# Patient Record
Sex: Female | Born: 1960 | Race: Black or African American | Hispanic: No | Marital: Married | Smoking: Former smoker
Health system: Southern US, Community
[De-identification: ages and names within clinical notes are randomized; demographics above are authoritative.]

## PROBLEM LIST (undated history)

## (undated) DIAGNOSIS — E785 Hyperlipidemia, unspecified: Secondary | ICD-10-CM

## (undated) DIAGNOSIS — F329 Major depressive disorder, single episode, unspecified: Secondary | ICD-10-CM

## (undated) DIAGNOSIS — L748 Other eccrine sweat disorders: Secondary | ICD-10-CM

## (undated) DIAGNOSIS — E119 Type 2 diabetes mellitus without complications: Secondary | ICD-10-CM

## (undated) DIAGNOSIS — M1990 Unspecified osteoarthritis, unspecified site: Secondary | ICD-10-CM

## (undated) DIAGNOSIS — R519 Headache, unspecified: Secondary | ICD-10-CM

## (undated) DIAGNOSIS — K219 Gastro-esophageal reflux disease without esophagitis: Secondary | ICD-10-CM

## (undated) DIAGNOSIS — IMO0001 Reserved for inherently not codable concepts without codable children: Secondary | ICD-10-CM

## (undated) DIAGNOSIS — T7840XA Allergy, unspecified, initial encounter: Secondary | ICD-10-CM

## (undated) DIAGNOSIS — E039 Hypothyroidism, unspecified: Secondary | ICD-10-CM

## (undated) DIAGNOSIS — R42 Dizziness and giddiness: Secondary | ICD-10-CM

## (undated) DIAGNOSIS — H409 Unspecified glaucoma: Secondary | ICD-10-CM

## (undated) DIAGNOSIS — G4733 Obstructive sleep apnea (adult) (pediatric): Secondary | ICD-10-CM

## (undated) DIAGNOSIS — D649 Anemia, unspecified: Secondary | ICD-10-CM

## (undated) DIAGNOSIS — R011 Cardiac murmur, unspecified: Secondary | ICD-10-CM

## (undated) DIAGNOSIS — I10 Essential (primary) hypertension: Secondary | ICD-10-CM

## (undated) DIAGNOSIS — IMO0002 Reserved for concepts with insufficient information to code with codable children: Secondary | ICD-10-CM

## (undated) DIAGNOSIS — F32A Depression, unspecified: Secondary | ICD-10-CM

## (undated) DIAGNOSIS — L750 Bromhidrosis: Secondary | ICD-10-CM

## (undated) DIAGNOSIS — M542 Cervicalgia: Secondary | ICD-10-CM

## (undated) DIAGNOSIS — G5600 Carpal tunnel syndrome, unspecified upper limb: Secondary | ICD-10-CM

## (undated) DIAGNOSIS — D219 Benign neoplasm of connective and other soft tissue, unspecified: Secondary | ICD-10-CM

## (undated) DIAGNOSIS — F419 Anxiety disorder, unspecified: Secondary | ICD-10-CM

## (undated) HISTORY — DX: Benign neoplasm of connective and other soft tissue, unspecified: D21.9

## (undated) HISTORY — PX: COLOSTOMY: SHX63

## (undated) HISTORY — DX: Cervicalgia: M54.2

## (undated) HISTORY — PX: TONSILLECTOMY: SUR1361

## (undated) HISTORY — DX: Type 2 diabetes mellitus without complications: E11.9

## (undated) HISTORY — DX: Hyperlipidemia, unspecified: E78.5

## (undated) HISTORY — DX: Major depressive disorder, single episode, unspecified: F32.9

## (undated) HISTORY — DX: Allergy, unspecified, initial encounter: T78.40XA

## (undated) HISTORY — DX: Unspecified glaucoma: H40.9

## (undated) HISTORY — DX: Gastro-esophageal reflux disease without esophagitis: K21.9

## (undated) HISTORY — DX: Anemia, unspecified: D64.9

## (undated) HISTORY — DX: Reserved for inherently not codable concepts without codable children: IMO0001

## (undated) HISTORY — DX: Depression, unspecified: F32.A

## (undated) HISTORY — DX: Carpal tunnel syndrome, unspecified upper limb: G56.00

## (undated) HISTORY — DX: Reserved for concepts with insufficient information to code with codable children: IMO0002

## (undated) HISTORY — DX: Dizziness and giddiness: R42

## (undated) HISTORY — DX: Other eccrine sweat disorders: L74.8

## (undated) HISTORY — PX: COLOSTOMY TAKEDOWN: SHX5258

## (undated) HISTORY — DX: Bromhidrosis: L75.0

## (undated) HISTORY — DX: Obstructive sleep apnea (adult) (pediatric): G47.33

## (undated) HISTORY — DX: Hypothyroidism, unspecified: E03.9

## (undated) HISTORY — DX: Essential (primary) hypertension: I10

---

## 1997-09-21 ENCOUNTER — Emergency Department (HOSPITAL_COMMUNITY): Admission: EM | Admit: 1997-09-21 | Discharge: 1997-09-21 | Payer: Self-pay | Admitting: Family Medicine

## 1998-11-07 ENCOUNTER — Encounter: Admission: RE | Admit: 1998-11-07 | Discharge: 1998-11-20 | Payer: Self-pay | Admitting: *Deleted

## 1999-09-16 ENCOUNTER — Other Ambulatory Visit: Admission: RE | Admit: 1999-09-16 | Discharge: 1999-09-16 | Payer: Self-pay | Admitting: Obstetrics & Gynecology

## 2000-10-31 ENCOUNTER — Encounter: Payer: Self-pay | Admitting: Family Medicine

## 2000-10-31 ENCOUNTER — Encounter: Admission: RE | Admit: 2000-10-31 | Discharge: 2000-10-31 | Payer: Self-pay | Admitting: Family Medicine

## 2000-11-09 ENCOUNTER — Other Ambulatory Visit: Admission: RE | Admit: 2000-11-09 | Discharge: 2000-11-09 | Payer: Self-pay | Admitting: Obstetrics and Gynecology

## 2001-11-08 ENCOUNTER — Other Ambulatory Visit: Admission: RE | Admit: 2001-11-08 | Discharge: 2001-11-08 | Payer: Self-pay | Admitting: Obstetrics and Gynecology

## 2001-12-27 ENCOUNTER — Encounter: Admission: RE | Admit: 2001-12-27 | Discharge: 2001-12-27 | Payer: Self-pay | Admitting: Obstetrics and Gynecology

## 2001-12-27 ENCOUNTER — Encounter: Payer: Self-pay | Admitting: Obstetrics and Gynecology

## 2002-06-15 ENCOUNTER — Emergency Department (HOSPITAL_COMMUNITY): Admission: EM | Admit: 2002-06-15 | Discharge: 2002-06-15 | Payer: Self-pay | Admitting: Emergency Medicine

## 2002-09-14 ENCOUNTER — Encounter: Payer: Self-pay | Admitting: Emergency Medicine

## 2002-09-14 ENCOUNTER — Emergency Department (HOSPITAL_COMMUNITY): Admission: EM | Admit: 2002-09-14 | Discharge: 2002-09-14 | Payer: Self-pay | Admitting: Emergency Medicine

## 2003-02-12 ENCOUNTER — Encounter: Admission: RE | Admit: 2003-02-12 | Discharge: 2003-02-12 | Payer: Self-pay | Admitting: Family Medicine

## 2003-05-17 ENCOUNTER — Other Ambulatory Visit: Admission: RE | Admit: 2003-05-17 | Discharge: 2003-05-17 | Payer: Self-pay | Admitting: Gynecology

## 2004-03-17 ENCOUNTER — Encounter: Admission: RE | Admit: 2004-03-17 | Discharge: 2004-03-17 | Payer: Self-pay | Admitting: Family Medicine

## 2004-05-24 ENCOUNTER — Emergency Department (HOSPITAL_COMMUNITY): Admission: EM | Admit: 2004-05-24 | Discharge: 2004-05-24 | Payer: Self-pay | Admitting: Emergency Medicine

## 2004-05-24 ENCOUNTER — Emergency Department (HOSPITAL_COMMUNITY): Admission: EM | Admit: 2004-05-24 | Discharge: 2004-05-24 | Payer: Self-pay | Admitting: Family Medicine

## 2004-08-12 ENCOUNTER — Other Ambulatory Visit: Admission: RE | Admit: 2004-08-12 | Discharge: 2004-08-12 | Payer: Self-pay | Admitting: Obstetrics and Gynecology

## 2005-04-28 ENCOUNTER — Encounter: Admission: RE | Admit: 2005-04-28 | Discharge: 2005-04-28 | Payer: Self-pay | Admitting: Family Medicine

## 2005-05-07 ENCOUNTER — Emergency Department (HOSPITAL_COMMUNITY): Admission: EM | Admit: 2005-05-07 | Discharge: 2005-05-07 | Payer: Self-pay | Admitting: Emergency Medicine

## 2005-09-16 ENCOUNTER — Other Ambulatory Visit: Admission: RE | Admit: 2005-09-16 | Discharge: 2005-09-16 | Payer: Self-pay | Admitting: Obstetrics and Gynecology

## 2006-09-16 ENCOUNTER — Emergency Department (HOSPITAL_COMMUNITY): Admission: EM | Admit: 2006-09-16 | Discharge: 2006-09-17 | Payer: Self-pay | Admitting: Emergency Medicine

## 2007-01-02 ENCOUNTER — Other Ambulatory Visit: Admission: RE | Admit: 2007-01-02 | Discharge: 2007-01-02 | Payer: Self-pay | Admitting: Gynecology

## 2007-07-14 ENCOUNTER — Emergency Department (HOSPITAL_COMMUNITY): Admission: EM | Admit: 2007-07-14 | Discharge: 2007-07-14 | Payer: Self-pay | Admitting: Family Medicine

## 2008-02-28 ENCOUNTER — Encounter: Payer: Self-pay | Admitting: Women's Health

## 2008-02-28 ENCOUNTER — Ambulatory Visit: Payer: Self-pay | Admitting: Women's Health

## 2008-02-28 ENCOUNTER — Other Ambulatory Visit: Admission: RE | Admit: 2008-02-28 | Discharge: 2008-02-28 | Payer: Self-pay | Admitting: Gynecology

## 2008-03-01 ENCOUNTER — Encounter: Admission: RE | Admit: 2008-03-01 | Discharge: 2008-03-01 | Payer: Self-pay | Admitting: Gynecology

## 2008-10-23 ENCOUNTER — Ambulatory Visit: Payer: Self-pay | Admitting: Women's Health

## 2009-01-04 HISTORY — PX: ABDOMINAL SURGERY: SHX537

## 2010-01-04 HISTORY — PX: COLON SURGERY: SHX602

## 2010-01-04 HISTORY — PX: VENTRAL HERNIA REPAIR: SHX424

## 2010-01-05 ENCOUNTER — Emergency Department (HOSPITAL_COMMUNITY)
Admission: EM | Admit: 2010-01-05 | Discharge: 2010-01-05 | Payer: Self-pay | Source: Home / Self Care | Admitting: Family Medicine

## 2010-01-25 ENCOUNTER — Encounter: Payer: Self-pay | Admitting: Family Medicine

## 2010-02-06 ENCOUNTER — Other Ambulatory Visit: Payer: Self-pay | Admitting: General Surgery

## 2010-02-06 ENCOUNTER — Inpatient Hospital Stay (HOSPITAL_COMMUNITY)
Admission: EM | Admit: 2010-02-06 | Discharge: 2010-02-19 | DRG: 329 | Disposition: A | Discharge status: Home Health | Payer: 59 | Attending: Surgery | Admitting: Surgery

## 2010-02-06 ENCOUNTER — Inpatient Hospital Stay (HOSPITAL_COMMUNITY): Payer: 59

## 2010-02-06 DIAGNOSIS — J45909 Unspecified asthma, uncomplicated: Secondary | ICD-10-CM | POA: Diagnosis present

## 2010-02-06 DIAGNOSIS — Z79899 Other long term (current) drug therapy: Secondary | ICD-10-CM

## 2010-02-06 DIAGNOSIS — F329 Major depressive disorder, single episode, unspecified: Secondary | ICD-10-CM | POA: Diagnosis present

## 2010-02-06 DIAGNOSIS — E119 Type 2 diabetes mellitus without complications: Secondary | ICD-10-CM | POA: Diagnosis present

## 2010-02-06 DIAGNOSIS — J9600 Acute respiratory failure, unspecified whether with hypoxia or hypercapnia: Secondary | ICD-10-CM

## 2010-02-06 DIAGNOSIS — A419 Sepsis, unspecified organism: Secondary | ICD-10-CM | POA: Diagnosis not present

## 2010-02-06 DIAGNOSIS — D62 Acute posthemorrhagic anemia: Secondary | ICD-10-CM | POA: Diagnosis not present

## 2010-02-06 DIAGNOSIS — R5381 Other malaise: Secondary | ICD-10-CM | POA: Diagnosis not present

## 2010-02-06 DIAGNOSIS — K5732 Diverticulitis of large intestine without perforation or abscess without bleeding: Principal | ICD-10-CM | POA: Diagnosis present

## 2010-02-06 DIAGNOSIS — K659 Peritonitis, unspecified: Secondary | ICD-10-CM | POA: Diagnosis present

## 2010-02-06 DIAGNOSIS — D72829 Elevated white blood cell count, unspecified: Secondary | ICD-10-CM | POA: Diagnosis not present

## 2010-02-06 DIAGNOSIS — E876 Hypokalemia: Secondary | ICD-10-CM | POA: Diagnosis not present

## 2010-02-06 DIAGNOSIS — F3289 Other specified depressive episodes: Secondary | ICD-10-CM | POA: Diagnosis present

## 2010-02-06 DIAGNOSIS — K5730 Diverticulosis of large intestine without perforation or abscess without bleeding: Secondary | ICD-10-CM

## 2010-02-06 DIAGNOSIS — I469 Cardiac arrest, cause unspecified: Secondary | ICD-10-CM | POA: Diagnosis not present

## 2010-02-06 LAB — DIFFERENTIAL
Basophils Absolute: 0 10*3/uL (ref 0.0–0.1)
Basophils Relative: 0 % (ref 0–1)
Eosinophils Absolute: 0 10*3/uL (ref 0.0–0.7)
Lymphocytes Relative: 30 % (ref 12–46)
Monocytes Absolute: 0.5 10*3/uL (ref 0.1–1.0)
Monocytes Relative: 5 % (ref 3–12)
Neutro Abs: 6.1 10*3/uL (ref 1.7–7.7)
Neutrophils Relative %: 65 % (ref 43–77)

## 2010-02-06 LAB — CBC
MCH: 23.4 pg — ABNORMAL LOW (ref 26.0–34.0)
MCV: 74.3 fL — ABNORMAL LOW (ref 78.0–100.0)
Platelets: 329 10*3/uL (ref 150–400)
RBC: 4.7 MIL/uL (ref 3.87–5.11)
RDW: 14.8 % (ref 11.5–15.5)
RDW: 14.9 % (ref 11.5–15.5)
WBC: 9 10*3/uL (ref 4.0–10.5)
WBC: 9.5 10*3/uL (ref 4.0–10.5)

## 2010-02-06 LAB — COMPREHENSIVE METABOLIC PANEL
AST: 30 U/L (ref 0–37)
Alkaline Phosphatase: 69 U/L (ref 39–117)
CO2: 26 mEq/L (ref 19–32)
Calcium: 9.4 mg/dL (ref 8.4–10.5)
Chloride: 97 mEq/L (ref 96–112)
Creatinine, Ser: 0.88 mg/dL (ref 0.4–1.2)
GFR calc non Af Amer: >60 mL/min (ref >60)
Glucose, Bld: 172 mg/dL — ABNORMAL HIGH (ref 70–99)
Potassium: 3.8 mEq/L (ref 3.5–5.1)
Sodium: 135 mEq/L (ref 135–145)

## 2010-02-06 LAB — GLUCOSE, CAPILLARY: Glucose-Capillary: 196 mg/dL — ABNORMAL HIGH (ref 70–99)

## 2010-02-06 LAB — ABO/RH: ABO/RH(D): O POS

## 2010-02-06 LAB — TYPE AND SCREEN: ABO/RH(D): O POS

## 2010-02-07 ENCOUNTER — Inpatient Hospital Stay (HOSPITAL_COMMUNITY): Payer: 59

## 2010-02-07 DIAGNOSIS — A419 Sepsis, unspecified organism: Secondary | ICD-10-CM

## 2010-02-07 DIAGNOSIS — R6520 Severe sepsis without septic shock: Secondary | ICD-10-CM

## 2010-02-07 DIAGNOSIS — J95821 Acute postprocedural respiratory failure: Secondary | ICD-10-CM

## 2010-02-07 HISTORY — PX: VENTRAL HERNIA REPAIR: SHX424

## 2010-02-07 HISTORY — PX: COLECTOMY WITH COLOSTOMY CREATION/HARTMANN PROCEDURE: SHX6598

## 2010-02-07 LAB — POCT I-STAT 3, ART BLOOD GAS (G3+)
Acid-base deficit: 4 mmol/L — ABNORMAL HIGH (ref 0.0–2.0)
Bicarbonate: 23.1 mEq/L (ref 20.0–24.0)
Bicarbonate: 22.6 mEq/L (ref 20.0–24.0)
O2 Saturation: 99 %
O2 Saturation: 96 %
Patient temperature: 99.5
TCO2: 25 mmol/L (ref 0–100)
TCO2: 24 mmol/L (ref 0–100)
pCO2 arterial: 45.1 mmHg — ABNORMAL HIGH (ref 35.0–45.0)
pH, Arterial: 7.142 — CL (ref 7.350–7.400)
pO2, Arterial: 89 mmHg (ref 80.0–100.0)

## 2010-02-07 LAB — BASIC METABOLIC PANEL
CO2: 21 mEq/L (ref 19–32)
CO2: 21 mEq/L (ref 19–32)
Calcium: 7.7 mg/dL — ABNORMAL LOW (ref 8.4–10.5)
Chloride: 105 mEq/L (ref 96–112)
Creatinine, Ser: 1.02 mg/dL (ref 0.4–1.2)
GFR calc Af Amer: >60 mL/min (ref >60)
GFR calc non Af Amer: 58 mL/min — ABNORMAL LOW (ref >60)
Glucose, Bld: 256 mg/dL — ABNORMAL HIGH (ref 70–99)
Potassium: 3.8 mEq/L (ref 3.5–5.1)
Sodium: 135 mEq/L (ref 135–145)

## 2010-02-07 LAB — PHOSPHORUS: Phosphorus: 3.7 mg/dL (ref 2.3–4.6)

## 2010-02-07 LAB — GLUCOSE, CAPILLARY
Glucose-Capillary: 317 mg/dL — ABNORMAL HIGH (ref 70–99)
Glucose-Capillary: 337 mg/dL — ABNORMAL HIGH (ref 70–99)
Glucose-Capillary: 317 mg/dL — ABNORMAL HIGH (ref 70–99)
Glucose-Capillary: 273 mg/dL — ABNORMAL HIGH (ref 70–99)
Glucose-Capillary: 253 mg/dL — ABNORMAL HIGH (ref 70–99)
Glucose-Capillary: 224 mg/dL — ABNORMAL HIGH (ref 70–99)
Glucose-Capillary: 187 mg/dL — ABNORMAL HIGH (ref 70–99)
Glucose-Capillary: 107 mg/dL — ABNORMAL HIGH (ref 70–99)
Glucose-Capillary: 127 mg/dL — ABNORMAL HIGH (ref 70–99)
Glucose-Capillary: 135 mg/dL — ABNORMAL HIGH (ref 70–99)

## 2010-02-07 LAB — MRSA PCR SCREENING: MRSA by PCR: NEGATIVE

## 2010-02-07 LAB — URINE MICROSCOPIC-ADD ON

## 2010-02-07 LAB — URINALYSIS, ROUTINE W REFLEX MICROSCOPIC
Nitrite: POSITIVE — AB
Specific Gravity, Urine: 1.039 — ABNORMAL HIGH (ref 1.005–1.030)
pH: 5.5 (ref 5.0–8.0)

## 2010-02-07 LAB — CBC
HCT: 34.6 % — ABNORMAL LOW (ref 36.0–46.0)
MCH: 23.1 pg — ABNORMAL LOW (ref 26.0–34.0)
MCHC: 31.5 g/dL (ref 30.0–36.0)
RDW: 15 % (ref 11.5–15.5)

## 2010-02-07 LAB — LIPID PANEL: HDL: 36 mg/dL — ABNORMAL LOW (ref >39)

## 2010-02-07 LAB — MAGNESIUM: Magnesium: 1.2 mg/dL — ABNORMAL LOW (ref 1.5–2.5)

## 2010-02-07 LAB — CARBOXYHEMOGLOBIN: Methemoglobin: 0.4 % (ref 0.0–1.5)

## 2010-02-08 ENCOUNTER — Inpatient Hospital Stay (HOSPITAL_COMMUNITY): Payer: 59

## 2010-02-08 LAB — GLUCOSE, CAPILLARY
Glucose-Capillary: 121 mg/dL — ABNORMAL HIGH (ref 70–99)
Glucose-Capillary: 183 mg/dL — ABNORMAL HIGH (ref 70–99)
Glucose-Capillary: 249 mg/dL — ABNORMAL HIGH (ref 70–99)

## 2010-02-08 LAB — CBC
MCV: 73.5 fL — ABNORMAL LOW (ref 78.0–100.0)
Platelets: 232 10*3/uL (ref 150–400)
RDW: 15.3 % (ref 11.5–15.5)
WBC: 12.7 10*3/uL — ABNORMAL HIGH (ref 4.0–10.5)

## 2010-02-08 LAB — POCT I-STAT 3, ART BLOOD GAS (G3+)
Patient temperature: 100.2
TCO2: 21 mmol/L (ref 0–100)
pH, Arterial: 7.371 (ref 7.350–7.400)

## 2010-02-08 LAB — BASIC METABOLIC PANEL
BUN: 6 mg/dL (ref 6–23)
Creatinine, Ser: 0.87 mg/dL (ref 0.4–1.2)
GFR calc non Af Amer: >60 mL/min (ref >60)
Potassium: 4.1 mEq/L (ref 3.5–5.1)

## 2010-02-09 ENCOUNTER — Inpatient Hospital Stay (HOSPITAL_COMMUNITY): Payer: 59

## 2010-02-09 LAB — CBC
HCT: 27.1 % — ABNORMAL LOW (ref 36.0–46.0)
Hemoglobin: 8.1 g/dL — ABNORMAL LOW (ref 12.0–15.0)
MCH: 23.8 pg — ABNORMAL LOW (ref 26.0–34.0)
MCV: 74 fL — ABNORMAL LOW (ref 78.0–100.0)
Platelets: 236 10*3/uL (ref 150–400)
RBC: 3.53 MIL/uL — ABNORMAL LOW (ref 3.87–5.11)
RBC: 3.66 MIL/uL — ABNORMAL LOW (ref 3.87–5.11)
WBC: 13.7 10*3/uL — ABNORMAL HIGH (ref 4.0–10.5)

## 2010-02-09 LAB — BODY FLUID CULTURE

## 2010-02-09 LAB — POCT I-STAT 3, ART BLOOD GAS (G3+)
Acid-base deficit: 3 mmol/L — ABNORMAL HIGH (ref 0.0–2.0)
O2 Saturation: 98 %
TCO2: 22 mmol/L (ref 0–100)
pO2, Arterial: 94 mmHg (ref 80.0–100.0)

## 2010-02-09 LAB — BASIC METABOLIC PANEL
CO2: 21 mEq/L (ref 19–32)
Calcium: 7.4 mg/dL — ABNORMAL LOW (ref 8.4–10.5)
Chloride: 111 mEq/L (ref 96–112)
GFR calc Af Amer: >60 mL/min (ref >60)
Sodium: 140 mEq/L (ref 135–145)

## 2010-02-09 LAB — GLUCOSE, CAPILLARY
Glucose-Capillary: 132 mg/dL — ABNORMAL HIGH (ref 70–99)
Glucose-Capillary: 132 mg/dL — ABNORMAL HIGH (ref 70–99)
Glucose-Capillary: 150 mg/dL — ABNORMAL HIGH (ref 70–99)

## 2010-02-09 LAB — APTT: aPTT: 34 seconds (ref 24–37)

## 2010-02-10 ENCOUNTER — Inpatient Hospital Stay (HOSPITAL_COMMUNITY): Payer: 59

## 2010-02-10 LAB — COMPREHENSIVE METABOLIC PANEL
ALT: 18 U/L (ref 0–35)
Alkaline Phosphatase: 52 U/L (ref 39–117)
BUN: 5 mg/dL — ABNORMAL LOW (ref 6–23)
Chloride: 110 mEq/L (ref 96–112)
Glucose, Bld: 150 mg/dL — ABNORMAL HIGH (ref 70–99)
Potassium: 3.9 mEq/L (ref 3.5–5.1)
Sodium: 140 mEq/L (ref 135–145)
Total Bilirubin: 1.1 mg/dL (ref 0.3–1.2)
Total Protein: 5.4 g/dL — ABNORMAL LOW (ref 6.0–8.3)

## 2010-02-10 LAB — CBC
HCT: 25.5 % — ABNORMAL LOW (ref 36.0–46.0)
Hemoglobin: 8.1 g/dL — ABNORMAL LOW (ref 12.0–15.0)
MCV: 74.1 fL — ABNORMAL LOW (ref 78.0–100.0)
RBC: 3.44 MIL/uL — ABNORMAL LOW (ref 3.87–5.11)
WBC: 12.5 10*3/uL — ABNORMAL HIGH (ref 4.0–10.5)

## 2010-02-10 LAB — DIFFERENTIAL
Basophils Absolute: 0.1 10*3/uL (ref 0.0–0.1)
Eosinophils Relative: 0 % (ref 0–5)
Lymphocytes Relative: 13 % (ref 12–46)
Lymphs Abs: 1.6 10*3/uL (ref 0.7–4.0)
Neutro Abs: 9.3 10*3/uL — ABNORMAL HIGH (ref 1.7–7.7)
Neutrophils Relative %: 75 % (ref 43–77)

## 2010-02-10 LAB — GLUCOSE, CAPILLARY
Glucose-Capillary: 122 mg/dL — ABNORMAL HIGH (ref 70–99)
Glucose-Capillary: 139 mg/dL — ABNORMAL HIGH (ref 70–99)
Glucose-Capillary: 157 mg/dL — ABNORMAL HIGH (ref 70–99)
Glucose-Capillary: 109 mg/dL — ABNORMAL HIGH (ref 70–99)

## 2010-02-10 LAB — BRAIN NATRIURETIC PEPTIDE: Pro B Natriuretic peptide (BNP): 43 pg/mL (ref 0.0–100.0)

## 2010-02-11 ENCOUNTER — Inpatient Hospital Stay (HOSPITAL_COMMUNITY): Payer: 59

## 2010-02-11 LAB — MAGNESIUM: Magnesium: 1.8 mg/dL (ref 1.5–2.5)

## 2010-02-11 LAB — BASIC METABOLIC PANEL
BUN: 5 mg/dL — ABNORMAL LOW (ref 6–23)
Calcium: 8.4 mg/dL (ref 8.4–10.5)
Creatinine, Ser: 0.74 mg/dL (ref 0.4–1.2)
GFR calc Af Amer: >60 mL/min (ref >60)
GFR calc non Af Amer: >60 mL/min (ref >60)

## 2010-02-11 LAB — CBC
MCH: 22.7 pg — ABNORMAL LOW (ref 26.0–34.0)
MCV: 73.2 fL — ABNORMAL LOW (ref 78.0–100.0)
Platelets: 265 10*3/uL (ref 150–400)
RDW: 15.5 % (ref 11.5–15.5)

## 2010-02-11 LAB — GLUCOSE, CAPILLARY
Glucose-Capillary: 123 mg/dL — ABNORMAL HIGH (ref 70–99)
Glucose-Capillary: 136 mg/dL — ABNORMAL HIGH (ref 70–99)
Glucose-Capillary: 138 mg/dL — ABNORMAL HIGH (ref 70–99)
Glucose-Capillary: 161 mg/dL — ABNORMAL HIGH (ref 70–99)

## 2010-02-11 LAB — POCT I-STAT 3, ART BLOOD GAS (G3+)
Bicarbonate: 26.2 mEq/L — ABNORMAL HIGH (ref 20.0–24.0)
O2 Saturation: 96 %
Patient temperature: 99
pO2, Arterial: 90 mmHg (ref 80.0–100.0)

## 2010-02-11 LAB — PHOSPHORUS: Phosphorus: 2.8 mg/dL (ref 2.3–4.6)

## 2010-02-12 ENCOUNTER — Inpatient Hospital Stay (HOSPITAL_COMMUNITY): Payer: 59

## 2010-02-12 DIAGNOSIS — R579 Shock, unspecified: Secondary | ICD-10-CM

## 2010-02-12 LAB — POCT I-STAT 3, ART BLOOD GAS (G3+)
Acid-Base Excess: 8 mmol/L — ABNORMAL HIGH (ref 0.0–2.0)
Bicarbonate: 30 mEq/L — ABNORMAL HIGH (ref 20.0–24.0)
Bicarbonate: 33.7 mEq/L — ABNORMAL HIGH (ref 20.0–24.0)
O2 Saturation: 93 %
pCO2 arterial: 45.7 mmHg — ABNORMAL HIGH (ref 35.0–45.0)
pCO2 arterial: 51.9 mmHg — ABNORMAL HIGH (ref 35.0–45.0)
pH, Arterial: 7.426 — ABNORMAL HIGH (ref 7.350–7.400)
pO2, Arterial: 80 mmHg (ref 80.0–100.0)
pO2, Arterial: 71 mmHg — ABNORMAL LOW (ref 80.0–100.0)

## 2010-02-12 LAB — COMPREHENSIVE METABOLIC PANEL
AST: 33 U/L (ref 0–37)
Albumin: 2.3 g/dL — ABNORMAL LOW (ref 3.5–5.2)
Alkaline Phosphatase: 47 U/L (ref 39–117)
Chloride: 103 mEq/L (ref 96–112)
GFR calc Af Amer: >60 mL/min (ref >60)
Potassium: 3.3 mEq/L — ABNORMAL LOW (ref 3.5–5.1)
Sodium: 141 mEq/L (ref 135–145)
Total Bilirubin: 1.1 mg/dL (ref 0.3–1.2)
Total Protein: 6.1 g/dL (ref 6.0–8.3)

## 2010-02-12 LAB — GLUCOSE, CAPILLARY
Glucose-Capillary: 216 mg/dL — ABNORMAL HIGH (ref 70–99)
Glucose-Capillary: 246 mg/dL — ABNORMAL HIGH (ref 70–99)
Glucose-Capillary: 180 mg/dL — ABNORMAL HIGH (ref 70–99)
Glucose-Capillary: 191 mg/dL — ABNORMAL HIGH (ref 70–99)
Glucose-Capillary: 182 mg/dL — ABNORMAL HIGH (ref 70–99)
Glucose-Capillary: 199 mg/dL — ABNORMAL HIGH (ref 70–99)

## 2010-02-13 ENCOUNTER — Inpatient Hospital Stay (HOSPITAL_COMMUNITY): Payer: 59

## 2010-02-13 DIAGNOSIS — T17408A Unspecified foreign body in trachea causing other injury, initial encounter: Secondary | ICD-10-CM

## 2010-02-13 LAB — CULTURE, BLOOD (ROUTINE X 2)
Culture  Setup Time: 201202041708
Culture: NO GROWTH
Culture: NO GROWTH

## 2010-02-13 LAB — POCT I-STAT 3, ART BLOOD GAS (G3+)
Acid-Base Excess: 13 mmol/L — ABNORMAL HIGH (ref 0.0–2.0)
Patient temperature: 98.6
pH, Arterial: 7.385 (ref 7.350–7.400)

## 2010-02-13 LAB — BASIC METABOLIC PANEL
BUN: 4 mg/dL — ABNORMAL LOW (ref 6–23)
Calcium: 8.5 mg/dL (ref 8.4–10.5)
Calcium: 8.4 mg/dL (ref 8.4–10.5)
Creatinine, Ser: 0.63 mg/dL (ref 0.4–1.2)
GFR calc non Af Amer: >60 mL/min (ref >60)
GFR calc non Af Amer: >60 mL/min (ref >60)
Glucose, Bld: 155 mg/dL — ABNORMAL HIGH (ref 70–99)
Glucose, Bld: 149 mg/dL — ABNORMAL HIGH (ref 70–99)
Potassium: 3 mEq/L — ABNORMAL LOW (ref 3.5–5.1)
Sodium: 146 mEq/L — ABNORMAL HIGH (ref 135–145)

## 2010-02-13 LAB — CBC
HCT: 25.5 % — ABNORMAL LOW (ref 36.0–46.0)
MCHC: 31.4 g/dL (ref 30.0–36.0)
Platelets: 286 10*3/uL (ref 150–400)
RDW: 15.3 % (ref 11.5–15.5)
WBC: 12.2 10*3/uL — ABNORMAL HIGH (ref 4.0–10.5)

## 2010-02-13 LAB — GLUCOSE, CAPILLARY
Glucose-Capillary: 117 mg/dL — ABNORMAL HIGH (ref 70–99)
Glucose-Capillary: 131 mg/dL — ABNORMAL HIGH (ref 70–99)
Glucose-Capillary: 147 mg/dL — ABNORMAL HIGH (ref 70–99)
Glucose-Capillary: 175 mg/dL — ABNORMAL HIGH (ref 70–99)
Glucose-Capillary: 182 mg/dL — ABNORMAL HIGH (ref 70–99)
Glucose-Capillary: 186 mg/dL — ABNORMAL HIGH (ref 70–99)
Glucose-Capillary: 188 mg/dL — ABNORMAL HIGH (ref 70–99)

## 2010-02-13 LAB — DIFFERENTIAL
Basophils Relative: 2 % — ABNORMAL HIGH (ref 0–1)
Lymphocytes Relative: 22 % (ref 12–46)
Lymphs Abs: 2.7 10*3/uL (ref 0.7–4.0)
Monocytes Relative: 11 % (ref 3–12)
Neutro Abs: 7.9 10*3/uL — ABNORMAL HIGH (ref 1.7–7.7)

## 2010-02-13 LAB — CARDIAC PANEL(CRET KIN+CKTOT+MB+TROPI): Troponin I: 0.01 ng/mL (ref 0.00–0.06)

## 2010-02-13 LAB — BRAIN NATRIURETIC PEPTIDE: Pro B Natriuretic peptide (BNP): 30 pg/mL (ref 0.0–100.0)

## 2010-02-13 LAB — MAGNESIUM: Magnesium: 1.7 mg/dL (ref 1.5–2.5)

## 2010-02-14 ENCOUNTER — Inpatient Hospital Stay (HOSPITAL_COMMUNITY): Payer: 59

## 2010-02-14 DIAGNOSIS — R404 Transient alteration of awareness: Secondary | ICD-10-CM

## 2010-02-14 LAB — POCT I-STAT 3, ART BLOOD GAS (G3+)
Acid-Base Excess: 9 mmol/L — ABNORMAL HIGH (ref 0.0–2.0)
Bicarbonate: 35.2 mEq/L — ABNORMAL HIGH (ref 20.0–24.0)
Bicarbonate: 39.7 mEq/L — ABNORMAL HIGH (ref 20.0–24.0)
O2 Saturation: 95 %
Patient temperature: 98.7
TCO2: 37 mmol/L (ref 0–100)
pH, Arterial: 7.491 — ABNORMAL HIGH (ref 7.350–7.400)
pO2, Arterial: 68 mmHg — ABNORMAL LOW (ref 80.0–100.0)

## 2010-02-14 LAB — GLUCOSE, CAPILLARY
Glucose-Capillary: 114 mg/dL — ABNORMAL HIGH (ref 70–99)
Glucose-Capillary: 126 mg/dL — ABNORMAL HIGH (ref 70–99)
Glucose-Capillary: 139 mg/dL — ABNORMAL HIGH (ref 70–99)
Glucose-Capillary: 141 mg/dL — ABNORMAL HIGH (ref 70–99)
Glucose-Capillary: 141 mg/dL — ABNORMAL HIGH (ref 70–99)
Glucose-Capillary: 154 mg/dL — ABNORMAL HIGH (ref 70–99)
Glucose-Capillary: 156 mg/dL — ABNORMAL HIGH (ref 70–99)
Glucose-Capillary: 159 mg/dL — ABNORMAL HIGH (ref 70–99)
Glucose-Capillary: 160 mg/dL — ABNORMAL HIGH (ref 70–99)
Glucose-Capillary: 167 mg/dL — ABNORMAL HIGH (ref 70–99)
Glucose-Capillary: 194 mg/dL — ABNORMAL HIGH (ref 70–99)
Glucose-Capillary: 262 mg/dL — ABNORMAL HIGH (ref 70–99)
Glucose-Capillary: 291 mg/dL — ABNORMAL HIGH (ref 70–99)
Glucose-Capillary: 305 mg/dL — ABNORMAL HIGH (ref 70–99)

## 2010-02-14 LAB — CBC
HCT: 27.6 % — ABNORMAL LOW (ref 36.0–46.0)
Hemoglobin: 8.5 g/dL — ABNORMAL LOW (ref 12.0–15.0)
MCH: 23 pg — ABNORMAL LOW (ref 26.0–34.0)
MCHC: 30.8 g/dL (ref 30.0–36.0)
MCV: 74.8 fL — ABNORMAL LOW (ref 78.0–100.0)

## 2010-02-14 LAB — BASIC METABOLIC PANEL
CO2: 33 mEq/L — ABNORMAL HIGH (ref 19–32)
Calcium: 8.7 mg/dL (ref 8.4–10.5)
Chloride: 100 mEq/L (ref 96–112)
Creatinine, Ser: 0.67 mg/dL (ref 0.4–1.2)
Glucose, Bld: 310 mg/dL — ABNORMAL HIGH (ref 70–99)

## 2010-02-14 NOTE — Op Note (Signed)
Mariah [name], Mariah [name]      ACCOUNT NO.:  [id number]  MEDICAL RECORD NO.:  [id number]           PATIENT TYPE:  I  LOCATION:  2102                         FACILITY:  [facility]  PHYSICIAN:  Merri Ray. Grandville Silos, M.D.DATE OF BIRTH:  1960/02/05  DATE OF PROCEDURE:  02/06/2010 DATE OF DISCHARGE:                              OPERATIVE REPORT   PREOPERATIVE DIAGNOSIS:  Perforated sigmoid diverticulitis.  POSTOPERATIVE DIAGNOSIS:  Perforated sigmoid diverticulitis.  PROCEDURES: 1. Exploratory laparotomy. 2. Sigmoid colectomy. 3. Colostomy.  SURGEON:  Merri Ray. Grandville Silos, MD  ASSISTANTS:  Adin Hector, MD and Darene Lamer. Hoxworth, MD  ANESTHESIA:  General endotracheal.  FINDINGS:  Perforated sigmoid colon with pus and peritonitis throughout the abdomen.  ESTIMATED BLOOD LOSS:  500 mL.  HISTORY OF PRESENT ILLNESS:  Mariah [name] is a 50 year old African American female with a history of diabetes and asthma, who developed sudden onset of left lower quadrant abdominal pain yesterday.  She was sent today for an outpatient CT scan of the abdomen and pelvis by her primary care physician.  This demonstrated perforated sigmoid diverticulitis, and she was referred to the emergency room.  I evaluated her there and brought her emergently to the operating room.  PROCEDURE IN DETAIL:  Informed consent was obtained and the patient received intravenous antibiotics.  She was brought to the operating room.  General endotracheal anesthesia was administered by the anesthesia staff.  Her Foley catheter was placed by nursing staff.  Her abdomen was prepped and draped in sterile fashion.  We did a time-out procedure.  A midline incision was made.  Subcutaneous tissues were dissected down revealing the anterior fascia.  This was divided sharply along the midline and the peritoneal cavity was entered under direct vision without difficulty.  There was frank purulent fluid in the abdomen.  This was  sent for culture.  The fascia was opened to the length of the skin incision.  Exploration revealed some omentum stuck down to the left lower quadrant.  This was freed up.  There was a small area of bleeding in the omentum that was controlled with a suture ligature.  Next, the small bowel was brought out of the left lower quadrant.  Due to the patient's central obesity, the mesentery was too short to eviscerate the small bowel, so it was packed out of the way. The colon was inspected.  There was a perforation in the proximal sigmoid colon.  The left colon was mobilized from the lateral peritoneal attachments along the white line of Toldt.  Next, the sigmoid colon was further mobilized and there was a left fallopian tube, which was adherent to it.  This was dissected free from the colon.  There was a little bit of bleeding there that was controlled with cautery and two large clips that controlled the bleeding.  Next, the area of perforation was inspected in the sigmoid, again we chose an area proximal to it where the colon was soft and normal.  The colon was divided with a GIA 75 stapler.  We then took down the mesentery of the perforated portion of the colon staying right along the colon wall and we took it down with  the LigaSure and we also did use several suture ligatures to get excellent hemostasis.  We continued down to several centimeters below the perforation where the colon in the distal sigmoid appeared more normal.  The appendices epiploica and fat along the side of colon were taken down with the LigaSure and once we skinned it down this distal portion of the colon, it was divided with a contour stapler with the blue load.  This gave an excellent closure.  The staple line was marked with two 2-0 Prolene sutures.  Next, the abdomen was copiously irrigated.  We further mobilized the proximal colon from its lateral peritoneal attachments and took distal little bit of the  mesentery without compromising its blood supply.  We used the LigaSure for that. This mobilized enough to bring out as a colostomy.  A circular incision was made in the left abdomen.  The fatty tissue was cored out with the Bovie cautery.  A cruciate incision was made in the fascia and the fascia was opened into the abdomen.  The aperture was widened to permit passage of the colostomy.  Colostomy was brought out through the abdominal wall and secured temporarily with a Babcock clamp.  The colostomy remained pink.  The abdomen was copiously irrigated with multiple liters of warm saline.  Bowel was returned to anatomic position.  Irrigation of fluid continued until it was clear.  Once that was done, the omentum was brought back down over the top of the viscera. The abdomen was closed with two lengths of running #1 PDS along with multiple intervening figure-of-eight #1 Novafil internal retention sutures.  PDS was tied together in the middle.  Subcutaneous tissues were irrigated.  The skin was loosely closed with staples.  The colostomy was then matured with interrupted 3-0 Vicryl pops and remained pink and viable.  A sterile wet-to-dry dressing was placed in the wound. The x-ray was taken again to confirm no retained foreign bodies due to an inaccuracy of the hemostatic count.  There were no apparent complications, and the patient remained in the operating room, getting a central line placed by Anesthesia, and will be taken directly to the intensive care unit intubated and on the ventilator.  I have consulted the Pulmonary Critical Care Service.     Merri Ray Grandville Silos, M.D.     BET/MEDQ  D:  02/06/2010  T:  02/07/2010  Job:  [id number]  Electronically Signed by Georganna Skeans M.D. on 02/08/2010 02:28:35 PM

## 2010-02-14 NOTE — H&P (Signed)
Mariah [name], Mariah [name]      ACCOUNT NO.:  [id number]  MEDICAL RECORD NO.:  [id number]           PATIENT TYPE:  I  LOCATION:  2102                         FACILITY:  [facility]  PHYSICIAN:  Merri Ray. Grandville Silos, M.D.DATE OF BIRTH:  12-Jul-1960  DATE OF ADMISSION:  02/06/2010 DATE OF DISCHARGE:                             HISTORY & PHYSICAL   CHIEF COMPLAINT:  Abdominal pain.  HISTORY OF PRESENT ILLNESS:  Ms. Mariah [name] is a 50 year old African American female who complained of acute onset of abdominal pain in the left lower quadrant yesterday at around 2:00 p.m.  The patient continued to have pain and decreased appetite.  She saw her primary physician who sent her for a CT scan of the abdomen and pelvis which was done today at [facility]. This revealed sigmoid diverticulitis with perforation, and she was directed to the emergency room.  She continues to complain of the left lower quadrant abdominal pain with nausea.  PAST MEDICAL HISTORY: 1. Asthma. 2. Diabetes mellitus. 3. Depression.  PAST SURGICAL HISTORY: 1. Tonsillectomy. 2. D and C. 3. Right adnexal dermoid cyst removal.  SOCIAL HISTORY:  She does not smoke, does not drink alcohol.  She is married and lives with her husband.  REVIEW OF SYSTEMS:  For GI include abdominal pain, some nausea, and decreased appetite.  She has not eaten anything since yesterday.  For pulmonary, she denies current asthma symptoms.  MEDICATIONS AT HOME:  Actos, glimepiride, Singulair, Lopid, and Proventil.  ALLERGIES:  CODEINE causing hives.  PHYSICAL EXAMINATION:  VITAL SIGNS:  Temperature 98.1, heart rate 92, respirations 20, blood pressure 131/82, saturations 96% on nasal cannula oxygen. HEENT:  Ears are clear.  Nares are patent.  Oral mucosa is dry. NECK:  Trachea is in the midline. CHEST:  Respiratory effort is good with lungs clear to auscultation. CARDIAC:  Heart has normal S1 and S2.  Distal pulses are 2+ and  no significant peripheral edema.  PMI is palpable in the left chest. ABDOMEN:  Mildly distended.  There is tenderness in the left lower quadrant with guarding.  There is no generalized tenderness or peritonitis, but there is significant tenderness on the left side as described above.  Bowel sounds are decreased.  No masses are felt.  No hernias are felt. LYMPH:  No significant lymphadenopathy in the cervical, periumbilical, or inguinal regions. SKIN:  Warm and dry. NEUROLOGIC:  Without gross focal deficit.  LABORATORY STUDIES:  White blood cell count 9.5, hemoglobin 13, platelets 329.  Lactate 3.1.  Liver function tests within normal limits except for bilirubin of 1.6.  Sodium 135, potassium 3.8, chloride 97, BUN 6, creatinine 0.88, glucose 172.  DIAGNOSTICS:  CT scan done at [facility] showing perforated sigmoid diverticulitis with large amount of free air and inflammatory changes of the nearby small bowel.  IMPRESSION: 1. Perforated diverticulitis. 2. Diabetes. 3. Asthma.  PLAN:  IV antibiotics and we will take the patient emergently to the operating room for colectomy and colostomy.  Procedure, risks, and benefits were discussed in detail with the patient and her husband, and they are agreeable.  We also discussed the possibility of her needing to stay intubated postoperatively due to  her history of asthma and a significant infection in her abdomen.     Merri Ray Grandville Silos, M.D.     BET/MEDQ  D:  02/06/2010  T:  02/07/2010  Job:  [id number]  cc:   Herbie Baltimore A. Alyson Ingles, M.D.  Electronically Signed by Georganna Skeans M.D. on 02/08/2010 02:28:30 PM

## 2010-02-15 ENCOUNTER — Inpatient Hospital Stay (HOSPITAL_COMMUNITY): Payer: 59

## 2010-02-15 LAB — GLUCOSE, CAPILLARY
Glucose-Capillary: 230 mg/dL — ABNORMAL HIGH (ref 70–99)
Glucose-Capillary: 278 mg/dL — ABNORMAL HIGH (ref 70–99)

## 2010-02-15 LAB — BASIC METABOLIC PANEL
CO2: 35 mEq/L — ABNORMAL HIGH (ref 19–32)
Chloride: 98 mEq/L (ref 96–112)
Creatinine, Ser: 0.68 mg/dL (ref 0.4–1.2)
GFR calc Af Amer: >60 mL/min (ref >60)

## 2010-02-15 LAB — ANAEROBIC CULTURE

## 2010-02-15 LAB — CBC
MCV: 74.5 fL — ABNORMAL LOW (ref 78.0–100.0)
Platelets: 332 10*3/uL (ref 150–400)
RBC: 3.49 MIL/uL — ABNORMAL LOW (ref 3.87–5.11)
RDW: 15 % (ref 11.5–15.5)
WBC: 17.4 10*3/uL — ABNORMAL HIGH (ref 4.0–10.5)

## 2010-02-15 LAB — PHOSPHORUS: Phosphorus: 4.7 mg/dL — ABNORMAL HIGH (ref 2.3–4.6)

## 2010-02-16 LAB — GLUCOSE, CAPILLARY: Glucose-Capillary: 132 mg/dL — ABNORMAL HIGH (ref 70–99)

## 2010-02-16 LAB — CBC
MCHC: 31.4 g/dL (ref 30.0–36.0)
Platelets: 320 10*3/uL (ref 150–400)
RDW: 15.1 % (ref 11.5–15.5)

## 2010-02-16 LAB — BASIC METABOLIC PANEL
BUN: 16 mg/dL (ref 6–23)
Calcium: 8.5 mg/dL (ref 8.4–10.5)
Creatinine, Ser: 0.75 mg/dL (ref 0.4–1.2)
GFR calc non Af Amer: >60 mL/min (ref >60)
Glucose, Bld: 184 mg/dL — ABNORMAL HIGH (ref 70–99)
Sodium: 142 mEq/L (ref 135–145)

## 2010-02-17 LAB — CBC
HCT: 30.1 % — ABNORMAL LOW (ref 36.0–46.0)
MCH: 23 pg — ABNORMAL LOW (ref 26.0–34.0)
MCV: 74.3 fL — ABNORMAL LOW (ref 78.0–100.0)
Platelets: 320 10*3/uL (ref 150–400)
RDW: 15.2 % (ref 11.5–15.5)

## 2010-02-17 LAB — GLUCOSE, CAPILLARY
Glucose-Capillary: 231 mg/dL — ABNORMAL HIGH (ref 70–99)
Glucose-Capillary: 240 mg/dL — ABNORMAL HIGH (ref 70–99)
Glucose-Capillary: 184 mg/dL — ABNORMAL HIGH (ref 70–99)
Glucose-Capillary: 216 mg/dL — ABNORMAL HIGH (ref 70–99)
Glucose-Capillary: 208 mg/dL — ABNORMAL HIGH (ref 70–99)
Glucose-Capillary: 213 mg/dL — ABNORMAL HIGH (ref 70–99)

## 2010-02-17 LAB — POTASSIUM: Potassium: 3.3 mEq/L — ABNORMAL LOW (ref 3.5–5.1)

## 2010-02-18 LAB — GLUCOSE, CAPILLARY
Glucose-Capillary: 308 mg/dL — ABNORMAL HIGH (ref 70–99)
Glucose-Capillary: 248 mg/dL — ABNORMAL HIGH (ref 70–99)

## 2010-02-18 LAB — CBC
HCT: 31.7 % — ABNORMAL LOW (ref 36.0–46.0)
MCH: 23.1 pg — ABNORMAL LOW (ref 26.0–34.0)
MCV: 74.6 fL — ABNORMAL LOW (ref 78.0–100.0)
Platelets: 327 10*3/uL (ref 150–400)
RBC: 4.25 MIL/uL (ref 3.87–5.11)
WBC: 15 10*3/uL — ABNORMAL HIGH (ref 4.0–10.5)

## 2010-02-18 LAB — CREATININE, SERUM: GFR calc non Af Amer: >60 mL/min (ref >60)

## 2010-02-19 LAB — CBC
HCT: 31 % — ABNORMAL LOW (ref 36.0–46.0)
RDW: 15.9 % — ABNORMAL HIGH (ref 11.5–15.5)
WBC: 13.1 10*3/uL — ABNORMAL HIGH (ref 4.0–10.5)

## 2010-02-19 LAB — URINE MICROSCOPIC-ADD ON

## 2010-02-19 LAB — URINALYSIS, ROUTINE W REFLEX MICROSCOPIC
Bilirubin Urine: NEGATIVE
Ketones, ur: NEGATIVE mg/dL
Protein, ur: NEGATIVE mg/dL
Specific Gravity, Urine: 1.016 (ref 1.005–1.030)
Urobilinogen, UA: 0.2 mg/dL (ref 0.0–1.0)

## 2010-02-19 LAB — GLUCOSE, CAPILLARY

## 2010-02-19 LAB — CREATININE, SERUM
Creatinine, Ser: 0.71 mg/dL (ref 0.4–1.2)
GFR calc non Af Amer: >60 mL/min (ref >60)

## 2010-02-20 LAB — POCT I-STAT 4, (NA,K, GLUC, HGB,HCT)
HCT: 37 % (ref 36.0–46.0)
Hemoglobin: 12.6 g/dL (ref 12.0–15.0)
Potassium: 3.8 mEq/L (ref 3.5–5.1)
Sodium: 136 mEq/L (ref 135–145)

## 2010-02-20 LAB — POCT I-STAT 3, ART BLOOD GAS (G3+)
Acid-base deficit: 2 mmol/L (ref 0.0–2.0)
Patient temperature: 38.6
pO2, Arterial: 371 mmHg — ABNORMAL HIGH (ref 80.0–100.0)

## 2010-02-26 ENCOUNTER — Other Ambulatory Visit: Payer: Self-pay | Admitting: General Surgery

## 2010-02-26 ENCOUNTER — Ambulatory Visit
Admission: RE | Admit: 2010-02-26 | Discharge: 2010-02-26 | Disposition: A | Discharge status: Home / Self Care | Payer: 59 | Source: Ambulatory Visit | Attending: General Surgery | Admitting: General Surgery

## 2010-02-26 DIAGNOSIS — R1110 Vomiting, unspecified: Secondary | ICD-10-CM

## 2010-03-16 NOTE — Discharge Summary (Signed)
NAMEVELDA, WENDT      ACCOUNT NO.:  [id number]  MEDICAL RECORD NO.:  [id number]           PATIENT TYPE:  I  LOCATION:  4239                         FACILITY:  [facility]  PHYSICIAN:  Adin Hector, MD     DATE OF BIRTH:  12-Jan-1960  DATE OF ADMISSION:  02/06/2010 DATE OF DISCHARGE:  02/19/2010                              DISCHARGE SUMMARY   ADMITTING PHYSICIAN:  Merri Ray. Grandville Silos, MD  DISCHARGING PHYSICIAN:  Adin Hector, MD  PROCEDURES: 1. Exploratory laparotomy with sigmoid colectomy and colostomy by Dr.     Georganna Skeans on February 06, 2010. 2. Bronchoscopy by Dr. Titus Mould on February 13, 2010.  CONSULTANTS:  Critical Care Medicine and Dr. Cristopher Peru with Cardiology/Electrophysiology.  REASON FOR ADMISSION:  Ms. Godley is a 50 year old black female who complained of an acute onset of abdominal pain and left lower quadrant on the day prior to admission around 2 p.m.  She continued to have pain with a decreased appetite.  She saw her primary care physician who sent her for a CT scan.  This revealed sigmoid diverticulitis with perforation.  At that time, she was asked to present to the emergency department.  Please see admitting history and physical for further details.  ADMITTING DIAGNOSES: 1. Perforated diverticulitis. 2. Diabetes mellitus. 3. Asthma.  HOSPITAL COURSE:  At this time, the patient was admitted.  She was placed on IV fluids and IV antibiotics.  She was emergently taken to the operating room for exploratory laparotomy with sigmoid colectomy and colostomy.  Secondary to sepsis as well as history of asthma, the patient was remained intubated postoperatively.  She did remain on a ventilator up until postoperative day #8.  The only issue is that she had with this was on postoperative day #7.  Her endotracheal tube was approximately 7-8 inches above the carina and she graded down eventually into asystole.  She was given atropine and her  ET tube was advanced and this resolved.  Otherwise, the patient initially had a postoperative ileus.  Her OG tube was left in place.  The patient began to have some bowel function and tube feeds were started on postoperative day #5 and she was advanced to goal rate.  On postoperative day #9, after extubation, the patient was started on clear liquid diet.  At this time, she was overall improving and was able to be transferred to a step-down unit.  Over the next several days, the patient's diet was advanced as tolerated and by postoperative day #13 the patient was tolerating a regular carb-modified diet without any difficulties.  On exam, her abdomen is soft with minimal tenderness. She did have active bowel sounds.  She does have ostomy output including liquid stool as well as flatus.  Her stoma is approximately 50% slough as well was 50% pink viable stoma.  The patient did have some difficulties with her CBGs.  She was started on sliding scale.  At the time that the patient was able to take p.o., her Actos was started as well.  At the time of discharge, the patient's CBGs remain high in the 200-300 range.  The patient states that she is not  eating her normal food and is not able to take her medicines as she normally does.  Therefore, we recommended close followup with her primary care physician to make sure that these remain under control.  The patient's white blood cell count began to rise the day prior to discharge.  On day #13, it did go back down to 13,000.  A urinalysis was checked which was kind of middle of the road.  It was not overwhelmingly positive for urinary tract infection, however, it did have some leukocyte esterase positive.  A urine culture is pending.  We will discharge the patient on 3 days' prophylactic of Cipro.  Otherwise, the patient at this time is stable for discharge home.  DISCHARGE DIAGNOSES: 1. Perforated diverticulitis, status post exploratory  laparotomy with     sigmoid colectomy and colostomy. 2. Sepsis, resolved. 3. Leukocytosis, improving. 4. Diabetes mellitus with elevated CBGs. 5. Acute blood loss anemia, stable. 6. Asthma, stable. 7. History of hypokalemia during hospitalization which is stable.  DISCHARGE MEDICATIONS:  Please see discharge medication reconciliation form.  On only new medications added were: 1. Roxicodone 5 one to two p.o. q.4 h. p.r.n. pain. 2. Cipro 500 mg 1 p.o. b.i.d. for 3 days.  DISCHARGE INSTRUCTIONS:  The patient may increase her activity slowly and walk up steps.  She may shower, however, she is not to bathe.  No lifting over 15-20 pounds for the next 6 weeks.  She is not to drive for the next 1-2 weeks.  She is to resume her normal diabetic diet.  She does have home health arranged as the patient did suffer from some deconditioning throughout the hospitalization.  She has home health arranged for PT as well as OT.  She also has nursing for ostomy care as well as packing to her midline wound.  She is informed she needs to pack her open wound twice a day with a normal saline wet-to-dry dressing change.  She is to change her ostomy supplies as needed.  She is to follow up with her primary care physician Dr. Alyson Ingles in 1 to 1-1/2 weeks for posthospital followup.  She is to return to see Dr. Grandville Silos in our office in the next 2 weeks.  She is to call for fever greater than 101.5 or worsening abdominal pain.     Henreitta Cea, PA   ______________________________ Adin Hector, MD    KEO/MEDQ  D:  02/19/2010  T:  02/20/2010  Job:  [id number]  cc:   Audree Camel. Alyson Ingles, M.D. Merri Ray Grandville Silos, M.D.  Electronically Signed by Saverio Danker PA on 02/27/2010 48:18:59 AM Electronically Signed by Michael Boston MD on 03/16/2010 11:58:53 AM

## 2010-03-23 ENCOUNTER — Encounter: Payer: 59 | Attending: Endocrinology

## 2010-03-23 DIAGNOSIS — Z713 Dietary counseling and surveillance: Secondary | ICD-10-CM | POA: Insufficient documentation

## 2010-03-23 DIAGNOSIS — E669 Obesity, unspecified: Secondary | ICD-10-CM | POA: Insufficient documentation

## 2010-03-23 DIAGNOSIS — E119 Type 2 diabetes mellitus without complications: Secondary | ICD-10-CM | POA: Insufficient documentation

## 2010-04-29 ENCOUNTER — Other Ambulatory Visit: Payer: Self-pay | Admitting: Family Medicine

## 2010-04-29 DIAGNOSIS — Z139 Encounter for screening, unspecified: Secondary | ICD-10-CM

## 2010-05-04 ENCOUNTER — Encounter (INDEPENDENT_AMBULATORY_CARE_PROVIDER_SITE_OTHER): Payer: Self-pay | Admitting: General Surgery

## 2010-05-13 ENCOUNTER — Other Ambulatory Visit: Payer: Self-pay | Admitting: Family Medicine

## 2010-05-13 ENCOUNTER — Ambulatory Visit
Admission: RE | Admit: 2010-05-13 | Discharge: 2010-05-13 | Disposition: A | Discharge status: Home / Self Care | Payer: 59 | Source: Ambulatory Visit | Attending: Family Medicine | Admitting: Family Medicine

## 2010-05-13 DIAGNOSIS — R928 Other abnormal and inconclusive findings on diagnostic imaging of breast: Secondary | ICD-10-CM

## 2010-05-13 DIAGNOSIS — Z139 Encounter for screening, unspecified: Secondary | ICD-10-CM

## 2010-05-19 ENCOUNTER — Encounter: Payer: 59 | Attending: Endocrinology

## 2010-05-19 DIAGNOSIS — Z713 Dietary counseling and surveillance: Secondary | ICD-10-CM | POA: Insufficient documentation

## 2010-05-19 DIAGNOSIS — E669 Obesity, unspecified: Secondary | ICD-10-CM | POA: Insufficient documentation

## 2010-05-19 DIAGNOSIS — E119 Type 2 diabetes mellitus without complications: Secondary | ICD-10-CM | POA: Insufficient documentation

## 2010-05-21 ENCOUNTER — Inpatient Hospital Stay: Admission: RE | Admit: 2010-05-21 | Payer: 59 | Source: Ambulatory Visit

## 2010-05-25 ENCOUNTER — Ambulatory Visit
Admission: RE | Admit: 2010-05-25 | Discharge: 2010-05-25 | Disposition: A | Discharge status: Home / Self Care | Payer: 59 | Source: Ambulatory Visit | Attending: Family Medicine | Admitting: Family Medicine

## 2010-05-25 DIAGNOSIS — R928 Other abnormal and inconclusive findings on diagnostic imaging of breast: Secondary | ICD-10-CM

## 2010-05-26 ENCOUNTER — Ambulatory Visit: Payer: 59

## 2010-05-27 ENCOUNTER — Other Ambulatory Visit: Payer: Self-pay | Admitting: Women's Health

## 2010-05-27 ENCOUNTER — Other Ambulatory Visit (HOSPITAL_COMMUNITY)
Admission: RE | Admit: 2010-05-27 | Discharge: 2010-05-27 | Disposition: A | Discharge status: Home / Self Care | Payer: 59 | Source: Ambulatory Visit | Attending: Gynecology | Admitting: Gynecology

## 2010-05-27 ENCOUNTER — Encounter (INDEPENDENT_AMBULATORY_CARE_PROVIDER_SITE_OTHER): Payer: 59 | Admitting: Women's Health

## 2010-05-27 DIAGNOSIS — Z124 Encounter for screening for malignant neoplasm of cervix: Secondary | ICD-10-CM | POA: Insufficient documentation

## 2010-05-27 DIAGNOSIS — R82998 Other abnormal findings in urine: Secondary | ICD-10-CM

## 2010-05-27 DIAGNOSIS — N951 Menopausal and female climacteric states: Secondary | ICD-10-CM

## 2010-05-27 DIAGNOSIS — Z01419 Encounter for gynecological examination (general) (routine) without abnormal findings: Secondary | ICD-10-CM

## 2010-07-23 ENCOUNTER — Other Ambulatory Visit (INDEPENDENT_AMBULATORY_CARE_PROVIDER_SITE_OTHER): Payer: Self-pay | Admitting: Surgery

## 2010-07-23 ENCOUNTER — Encounter (HOSPITAL_COMMUNITY): Payer: 59

## 2010-07-23 LAB — CBC
Platelets: 340 10*3/uL (ref 150–400)
RBC: 5.07 MIL/uL (ref 3.87–5.11)
WBC: 5.5 10*3/uL (ref 4.0–10.5)

## 2010-07-23 LAB — BASIC METABOLIC PANEL
BUN: 8 mg/dL (ref 6–23)
CO2: 28 mEq/L (ref 19–32)
Calcium: 10.2 mg/dL (ref 8.4–10.5)
Chloride: 98 mEq/L (ref 96–112)
Creatinine, Ser: 0.88 mg/dL (ref 0.50–1.10)
GFR calc Af Amer: >60 mL/min (ref >60)
GFR calc non Af Amer: >60 mL/min (ref >60)
Glucose, Bld: 89 mg/dL (ref 70–99)
Potassium: 4 mEq/L (ref 3.5–5.1)
Sodium: 135 mEq/L (ref 135–145)

## 2010-07-23 LAB — HCG, SERUM, QUALITATIVE: Preg, Serum: NEGATIVE

## 2010-07-23 LAB — SURGICAL PCR SCREEN
MRSA, PCR: NEGATIVE
Staphylococcus aureus: NEGATIVE

## 2010-07-28 ENCOUNTER — Other Ambulatory Visit (INDEPENDENT_AMBULATORY_CARE_PROVIDER_SITE_OTHER): Payer: Self-pay | Admitting: Surgery

## 2010-07-28 ENCOUNTER — Inpatient Hospital Stay (HOSPITAL_COMMUNITY)
Admission: RE | Admit: 2010-07-28 | Discharge: 2010-08-01 | DRG: 331 | Disposition: A | Discharge status: Home / Self Care | Payer: 59 | Source: Ambulatory Visit | Attending: Surgery | Admitting: Surgery

## 2010-07-28 DIAGNOSIS — Z01812 Encounter for preprocedural laboratory examination: Secondary | ICD-10-CM

## 2010-07-28 DIAGNOSIS — E119 Type 2 diabetes mellitus without complications: Secondary | ICD-10-CM | POA: Diagnosis present

## 2010-07-28 DIAGNOSIS — K5732 Diverticulitis of large intestine without perforation or abscess without bleeding: Principal | ICD-10-CM | POA: Diagnosis present

## 2010-07-28 DIAGNOSIS — K660 Peritoneal adhesions (postprocedural) (postinfection): Secondary | ICD-10-CM | POA: Diagnosis present

## 2010-07-28 DIAGNOSIS — Z9049 Acquired absence of other specified parts of digestive tract: Secondary | ICD-10-CM

## 2010-07-28 DIAGNOSIS — K5730 Diverticulosis of large intestine without perforation or abscess without bleeding: Secondary | ICD-10-CM

## 2010-07-28 DIAGNOSIS — Z433 Encounter for attention to colostomy: Secondary | ICD-10-CM

## 2010-07-28 DIAGNOSIS — F3289 Other specified depressive episodes: Secondary | ICD-10-CM | POA: Diagnosis present

## 2010-07-28 DIAGNOSIS — K432 Incisional hernia without obstruction or gangrene: Secondary | ICD-10-CM | POA: Diagnosis present

## 2010-07-28 DIAGNOSIS — F329 Major depressive disorder, single episode, unspecified: Secondary | ICD-10-CM | POA: Diagnosis present

## 2010-07-28 HISTORY — PX: COLOSTOMY TAKEDOWN: SHX5258

## 2010-07-28 LAB — GLUCOSE, CAPILLARY
Glucose-Capillary: 115 mg/dL — ABNORMAL HIGH (ref 70–99)
Glucose-Capillary: 214 mg/dL — ABNORMAL HIGH (ref 70–99)
Glucose-Capillary: 217 mg/dL — ABNORMAL HIGH (ref 70–99)
Glucose-Capillary: 185 mg/dL — ABNORMAL HIGH (ref 70–99)

## 2010-07-29 LAB — GLUCOSE, CAPILLARY
Glucose-Capillary: 137 mg/dL — ABNORMAL HIGH (ref 70–99)
Glucose-Capillary: 142 mg/dL — ABNORMAL HIGH (ref 70–99)
Glucose-Capillary: 163 mg/dL — ABNORMAL HIGH (ref 70–99)

## 2010-07-29 LAB — CBC
Hemoglobin: 7.9 g/dL — ABNORMAL LOW (ref 12.0–15.0)
MCH: 22.7 pg — ABNORMAL LOW (ref 26.0–34.0)
MCHC: 31.6 g/dL (ref 30.0–36.0)
RDW: 15.3 % (ref 11.5–15.5)

## 2010-07-29 LAB — CREATININE, SERUM: Creatinine, Ser: 1.08 mg/dL (ref 0.50–1.10)

## 2010-07-29 NOTE — Op Note (Signed)
NAMEMarland Kitchen  JOCEE, KISSICK      ACCOUNT NO.:  [id number]  MEDICAL RECORD NO.:  [id number]  LOCATION:  0010                         FACILITY:  [facility]  PHYSICIAN:  Adin Hector, MD     DATE OF BIRTH:  [birth year]  DATE OF PROCEDURE:  07/28/2010 DATE OF DISCHARGE:                              OPERATIVE REPORT   PRIMARY CARE PHYSICIAN:  Herbie Baltimore A. Alyson Ingles, MD  ENDOCRINOLOGIST:  Jacelyn Pi, MD  GASTROENTEROLOGIST:  Lear Ng, MD, of [facility].  SURGEON:  Adin Hector, MD  ASSISTANT:  Orson Ape. Rise Patience, MD  PREOPERATIVE DIAGNOSES:  Diverticulosis status post laparotomy and sigmoid colectomy with end colostomy, request for colostomy takedown.  POSTOPERATIVE DIAGNOSES: 1. Diverticulosis status post laparotomy and sigmoid colectomy with     end colostomy, request for colostomy takedown. 2. Infraumbilical right lower quadrant ventral incisional hernia, non-     incarcerated.  PROCEDURE PERFORMED: 1. Laparoscopic lysis of adhesions for 90 minutes. 2. Laparoscopic-assisted partial colectomy. 3. Laparoscopic-assisted colostomy takedown with anastomosis.  ANESTHESIA: 1. General anesthesia. 2. Local anesthetic in a field block around all port sites and     colostomy site.  SPECIMENS:  Remaining sigmoid colon as well as anastomotic rings.  DRAINS:  None.  ESTIMATED BLOOD LOSS:  150 mL.  COMPLICATIONS:  None major.  INDICATIONS:  Ms. Sazama is a 50 year old obese diabetic female who had perforated diverticulitis that required emergency colectomy and colostomy in February 2012.  She has since recovered from that time. She did have a chronic wound, but gradually that closed down.  She is back to her normal nutritional status and function.  She requests a colostomy takedown.  She requested a laparoscopic takedown.  Therefore, Dr. Marcello Moores sent the patient to me for this approach.  Technique of colostomy takedown with anastomosis discussed.   Possibility of conversion to open was discussed.  The risks of leak, bleeding, stroke, heart attack, death and other risks were discussed in detail. Questions answered and she agreed to proceed.  OPERATIVE FINDINGS:  She had some moderate adhesions of greater omentum to the anterior abdominal wall.  She had a 6 x 6 cm incisional hernia going from midline towards the right lower quadrant.  There was omentum attached to it, but no major incarceration or strangulation.  She did have some chronic irritation of the distal sigmoid/rectal stump. Hence, I resected to healthy rectum.  DESCRIPTION OF PROCEDURE:  Informed consent was confirmed.  The patient received IV cefoxitin prior to incision.  She underwent general anesthesia without any difficulty.  She was positioned in low lithotomy with left arm out, right arm tucked.  She had a Foley catheter sterilely placed.  Her abdomen and mons pubis were clipped, prepped and draped in a sterile fashion.  I had removed her colostomy appliance and 3 stitches to close with a pursestring 2-0 silk stitch.  I placed a sterile dressing on that and the patient was prepped and draped in sterile fashion.  Surgical timeout confirmed our plan.  I placed a #5 mm port in the right upper quadrant using optical entry technique with the patient in steep reverse Trendelenburg and right side up.  Entry was clean.  I was able to place 5 mm  ports in the right mid abdomen and right lower quadrant.  Later in the case, I upsized the right lower quadrant port to 12 mm port and placed a 5 mm port through supraumbilical midline incision.  She had moderate adhesions of greater omentum to the anterior abdominal wall.  I freed these off using controlled blunt and sharp dissection.  I did have a split in the omentum as some of it was more densely adherent centrally and I got some bleeding in the greater omentum.  This was the source of most of the bleeding.  I was able to  ultimately isolate and control it with ultrasonic Harmonic scalpel.  Over time, I was able to free the greater omentum off the anterior abdominal wall.  I found a hernia in the right lower quadrant with some greater omentum within it. However, there was no evidence of any significant incarceration nor strangulation at this time.  I freed the adhesions between the small bowel loops off the descending colon that was going up as a colostomy.  I also freed it off the rectal stump.  I was able to isolate and elevate the rectal stump.  It did come up into the abdomen because there was some remaining sigmoid.  There were several inches of inflammation at the proximal end of the stump.  I therefore decided to resect back to healthy rectum.  I created a window at the rectosigmoid junction, more at the sacral promontory.  I took the mesentery more proximally close to the remaining sigmoid colon using ultrasonic dissection.  I did do some bleeding there, but hemostasis was excellent at the end of case.  Once I had freed that off, I transected using a laparoscopic stapler to get a healthier cuff.  I also had some incarcerated omentum in the ventral hernia that had become morcellated and I removed that as well.  I placed both the redundant necrotic omentum and the remaining sigmoid that I trimmed off into an Endocatch bag.  I evacuated carbon dioxide.  I made a lock-and-key vertical incision around the closed colostomy through the skin and subcutaneous tissues. I was able to free the colon off its attachments to the anterior abdominal wall first in subcutaneous tissues and then around the fascia as well.  I was able to get into the peritoneal cavity and carefully free off until the colostomy was completely released.  I ended up trimming back the colostomy a few inches as there was some moderate inflammation on the distal 2 cm of the colostomy to the subcutaneous fat until I had more healthy tissue.   I had good bleeding at the mucosa.  I did sizers and I was able to place a 33A anvil into the open end of the descending colon and closed the colon around it using a 0 Prolene pursestring stitch.  I returned the colon into the abdomen.  I pulled out the bag that contained the necrotic omentum and the transected colon.  I clamped off the skin.  I induced carbon dioxide deflation.  I went down to rectum and released some few mucus balls out of the rectal stump.  I did rigid proctoscopy and noted more rectal balls all the way up to the staple line.  I did dilation with finger and also some EA sizer dilators until I could get the 33A stapler up.  I could not get it all the way to the end of the rectal stump as there was a mild narrowing and  to avoid any injury to the mucosa, I went ahead and brought the spike out anteriorly.  It was a little more left anterolaterally.  It was about 4 cm proximal to the staple line.  I attached the anvil of the descending colon to the spike in the stapler.  I brought the anvil down on to the stapler, held it clamped for a minute, fired, held the firing clamped for 30 seconds and then released.  I had 2 good anastomotic rings.  We did a leak test with Dr. Rise Patience clamping the colon proximal anastomosis and the pelvis irrigated.  There was no leak of air under there.  I measured the anastomosis at 14 cm from the anal verge.  We did copious irrigation of several liters in the pelvis as well in the mid abdomen.  There was a small oozer off the greater omentum that I was able to see, isolate and control with ultrasonic dissection.  I allowed the greater omentum to fall back down over the abdomen to cover up the small bowel.  The anastomosis looked healthy, was not tensioned and there was not any significant twisting or torsion of the anastomosis.  I closed the 12 mm port using 0 Vicryl stitch using laparoscopic suture passer.  I evacuated carbon dioxide and  removed the ports.  I closed the smaller port sites.  I closed the colostomy fascial site vertically as the fascia seemed to come better there with #1 looped PDS in a running fashion from both corners tying in the middle.  I did copious irrigation, closed the skin using 4-0 Monocryl stitch bringing it together loosely.  I placed some 0.25-inch iodoform wicks into the wound to help allow the colostomy wound to drain out.  Sterile dressings were applied.  The patient was extubated and returned to recovery room in stable condition.  I discussed postoperative care with the patient in detail.  I am about to discuss with husband.  Per her request, I will try and reach her mother who is up in [place] as well.     Adin Hector, MD     SCG/MEDQ  D:  07/28/2010  T:  07/28/2010  Job:  [id number]  Electronically Signed by Michael Boston MD on 07/29/2010 08:34:24 AM

## 2010-07-30 LAB — GLUCOSE, CAPILLARY
Glucose-Capillary: 127 mg/dL — ABNORMAL HIGH (ref 70–99)
Glucose-Capillary: 135 mg/dL — ABNORMAL HIGH (ref 70–99)
Glucose-Capillary: 116 mg/dL — ABNORMAL HIGH (ref 70–99)
Glucose-Capillary: 111 mg/dL — ABNORMAL HIGH (ref 70–99)

## 2010-07-31 LAB — CBC
HCT: 20.4 % — ABNORMAL LOW (ref 36.0–46.0)
HCT: 20.9 % — ABNORMAL LOW (ref 36.0–46.0)
Hemoglobin: 6.5 g/dL — CL (ref 12.0–15.0)
MCH: 23 pg — ABNORMAL LOW (ref 26.0–34.0)
MCHC: 31.9 g/dL (ref 30.0–36.0)
MCV: 72.1 fL — ABNORMAL LOW (ref 78.0–100.0)
RBC: 2.9 MIL/uL — ABNORMAL LOW (ref 3.87–5.11)
RDW: 15.5 % (ref 11.5–15.5)
WBC: 7.3 10*3/uL (ref 4.0–10.5)

## 2010-07-31 LAB — GLUCOSE, CAPILLARY
Glucose-Capillary: 134 mg/dL — ABNORMAL HIGH (ref 70–99)
Glucose-Capillary: 144 mg/dL — ABNORMAL HIGH (ref 70–99)

## 2010-07-31 LAB — HEMOGLOBIN AND HEMATOCRIT, BLOOD
HCT: 21.9 % — ABNORMAL LOW (ref 36.0–46.0)
Hemoglobin: 6.9 g/dL — CL (ref 12.0–15.0)

## 2010-07-31 LAB — CREATININE, SERUM: Creatinine, Ser: 0.92 mg/dL (ref 0.50–1.10)

## 2010-08-01 LAB — CBC
MCV: 72.5 fL — ABNORMAL LOW (ref 78.0–100.0)
Platelets: 279 10*3/uL (ref 150–400)
RBC: 2.98 MIL/uL — ABNORMAL LOW (ref 3.87–5.11)
RDW: 15.6 % — ABNORMAL HIGH (ref 11.5–15.5)
WBC: 9.1 10*3/uL (ref 4.0–10.5)

## 2010-08-01 LAB — GLUCOSE, CAPILLARY

## 2010-08-03 LAB — TYPE AND SCREEN
ABO/RH(D): O POS
Antibody Screen: NEGATIVE
Unit division: 0

## 2010-08-05 NOTE — Discharge Summary (Signed)
NAMEMarland Kitchen  Mariah, [name]      ACCOUNT NO.:  [id number]  MEDICAL RECORD NO.:  [id number]  LOCATION:  5681                         FACILITY:  [facility]  PHYSICIAN:  Adin Hector, MD     DATE OF BIRTH:  1960/10/31  DATE OF ADMISSION:  07/28/2010 DATE OF DISCHARGE:  08/01/2010                              DISCHARGE SUMMARY   PRIMARY CARE PHYSICIAN:  Herbie Baltimore A. Alyson Ingles, M.D.  GASTROENTEROLOGIST:  Lear Ng, MD.  SURGERY:  Adin Hector, MD.  PRINCIPAL DIAGNOSES: 1. Perforated diverticulitis, status post emergent colectomy and     colostomy. 2. Right lower quadrant ventral wall incisional hernia,     nonincarcerated.  PROCEDURE PERFORMED:  Laparoscopic lysis of adhesions with partial colectomy of remaining sigmoid tissue and colostomy takedown on July 28, 2010.  OTHER DIAGNOSES:  Include: 1. Diabetes. 2. Morbid obesity. 3. Depression. 4. Status post tonsillectomy. 5. Status post D and C. 6. Status post removal of right adnexal dermoid cyst.  SUMMARY OF HOSPITAL COURSE:  Mariah [name] is a 50 year old morbidly obese female who had an emergent colectomy and colostomy by Dr. Georganna Skeans in February 2012.  She has recovered from that surgery.  She requested colostomy takedown laparoscopically.  Dr. Grandville Silos sent the patient to me to do this.  She underwent the procedure.  Postoperatively, the patient was placed on the anti-ileus protocol.  She was mobilized.  She transitioned over from IV intravenous medications to oral medications.  At the time of discharge, she was tolerated a solid diet with bowel movements in place.  She had adequate pain control on oral medications.  She had had some issues with some melena and hematochezia.  Her hematocrit did drift down to the low 20s.  However, it stayed stable for 48 hours.  At the time of discharge, she was walking well with help of walker for balance, which she uses at home.  Based on improvement, she was felt  reasonable to discharge home with following  instructions: 1. She is to return to the clinic to see me in 1 to 2 weeks. 2. She should take fiver every day to avoid any severe constipation or     diarrhea.  She should drink plenty of fluids.  She should advance     to a diabetic as tolerated.  She should call if she has worsening     uncontrolled pain, diarrhea or constipation.  She should expect to     have some bloody movement but this gradually should taper off, it     seems like it already has. 3. She should take a multivitamin every day with some iron to help     convert her anemia.  If she feels lightheaded, dizzy, worsening     pain control, then she should call sooner. 4. She should continue to follow and control her glucoses with her     oral medications as tolerated. 5. She should call if she has any fevers, chills, sweats, nausea,     vomiting, uncontrolled pain or diarrhea. 6. She can resume her home medications, which include ipratropium,     Singulair, Advair Diskus and albuterol p.r.n.  She is to continue  her enteric-coated aspirin 81 mg daily, vitamin  C daily,     multivitamin daily, Systane Balance for both eyes p.r.n.,     promethazine with p.r.n. nausea, fish oil 1200 mg p.o. daily, extra     strength Tylenol p.r.n., Protonix 40 mg p.o. daily, Janumet 50/1000     b.i.d. 7. She can use ice packs, heating packs, warm showers, Tylenol p.r.n.     pain.  She is to use oxycodone 5 to 10 mg p.o. q.4 h. p.r.n. pain.     She should shower every day, keep her incisions clean and dry; heal     colostomy site and expect to have little bit of mild serous     drainage but that should gradually taper down.  If it gets worse or     uncontrollable or if she has other issues, she call us sooner.     These instructions were discussed with her in written.  She     expressed understanding and appreciation.     Adin Hector, MD     SCG/MEDQ  D:  08/01/2010  T:  08/01/2010  Job:   [id number]  cc:   Herbie Baltimore A. Alyson Ingles, M.D. Fax: [phone]  Lear Ng, MD Fax: [phone]  Electronically Signed by Michael Boston MD on 08/05/2010 08:55:28 AM

## 2010-08-10 ENCOUNTER — Telehealth (INDEPENDENT_AMBULATORY_CARE_PROVIDER_SITE_OTHER): Payer: Self-pay | Admitting: General Surgery

## 2010-08-10 ENCOUNTER — Encounter (INDEPENDENT_AMBULATORY_CARE_PROVIDER_SITE_OTHER): Payer: Self-pay | Admitting: Surgery

## 2010-08-10 NOTE — Telephone Encounter (Signed)
Patient called s/p takedown colostomy. Patient states has had diarrhea since Thursday evening. Has gone 2-3x per day. I advised patient I wouldn't worry about this too much. I advised if patient develops more frequent BM's (7-8x day) or no BM's that would be more worrisome. Patient also complains of some drainage from incision. States it is a pink/red color. No odor. No redness around wound. No fevers. Patient has been keeping a dressing over this and changing twice a day. I advised since she has appt scheduled for Wednesday to keep doing what she is doing, keep it clean and dry and show to Dr Johney Maine on Wednesday. If she develops any other symptoms to call back.

## 2010-08-12 ENCOUNTER — Ambulatory Visit (INDEPENDENT_AMBULATORY_CARE_PROVIDER_SITE_OTHER): Payer: 59 | Admitting: Surgery

## 2010-08-12 ENCOUNTER — Encounter (INDEPENDENT_AMBULATORY_CARE_PROVIDER_SITE_OTHER): Payer: Self-pay | Admitting: Surgery

## 2010-08-12 DIAGNOSIS — K5730 Diverticulosis of large intestine without perforation or abscess without bleeding: Secondary | ICD-10-CM

## 2010-08-12 DIAGNOSIS — IMO0002 Reserved for concepts with insufficient information to code with codable children: Secondary | ICD-10-CM

## 2010-08-12 NOTE — Patient Instructions (Signed)
Dressing Change Dressings are placed over wounds to keep them clean, dry, and protected from further injury. This provides an environment that favors wound healing. Good wound care includes resting and elevating the injured part until the pain and swelling are better. Change your wound dressing as recommended by your caregiver. When removing an old dressing, lift it slowly away from the wound. If the dressing sticks to the wound, dampen it with half-strength peroxide or tap water. Clean the wound gently with a moist cloth in shower, remove any loose material. It is okay for a wound to get wet. Wash it with mildly soapy water. Watch for signs of infection when changing a dressing.  Pack into the wound with clean gauze to keep skin open & allow the deeper cavity to shrink down and close over time.    SEEK MEDICAL CARE IF YOU DEVELOP:  Increased pain, redness, or swelling.   Pus-like drainage from the wound.   Fever greater than 101.5 F (38 C).  Document Released: 01/29/2004 Document Re-Released: 03/30/2007 [facility] Patient Information [address]

## 2010-08-12 NOTE — Progress Notes (Signed)
Subjective:     Patient ID: Mariah [name], female   DOB: Oct 01, 1960, 50 y.o.   MRN: [id number]  HPI  Diagnosis: Diverticulitis with perforation status post colectomy and colostomy fiber 2012 by Dr. Georganna Skeans.  Surgery: Lap assisted colostomy takedown 07/28/2010 by Dr. Johney Maine  Reason for visit: Followup.  Patient comes a feeling okay. Soreness and her ostomy site. A mild amount of drainage that. She is to change dressing twice a day. No fevers, chills, sweats. Appetite is okay. Some loose bowel movements. It down to 3 bowel movements a day. Energy level okay. She comes in with her husband.  Review of Systems  Constitutional: Negative for fever, chills, diaphoresis, appetite change and fatigue.  HENT: Negative for ear pain, sore throat, trouble swallowing, neck pain and ear discharge.   Eyes: Negative for photophobia, discharge and visual disturbance.  Respiratory: Negative for cough, choking, chest tightness and shortness of breath.   Cardiovascular: Negative for chest pain and palpitations.  Gastrointestinal: Negative for nausea, vomiting, diarrhea, constipation, blood in stool, anal bleeding and rectal pain.       Soreness at old ostomy site with mild drainage  Genitourinary: Negative for dysuria, frequency and difficulty urinating.  Musculoskeletal: Negative for myalgias and gait problem.  Skin: Negative for color change, pallor and rash.  Neurological: Negative for dizziness, speech difficulty, weakness and numbness.  Hematological: Negative for adenopathy.  Psychiatric/Behavioral: Negative for confusion and agitation. The patient is not nervous/anxious.        Objective:   Physical Exam  Constitutional: She is oriented to person, place, and time. She appears well-developed and well-nourished. No distress.  HENT:  Head: Normocephalic.  Mouth/Throat: Oropharynx is clear and moist. No oropharyngeal exudate.  Eyes: Conjunctivae and EOM are normal. Pupils are equal,  round, and reactive to light. No scleral icterus.  Neck: Normal range of motion. Neck supple. No tracheal deviation present.  Cardiovascular: Normal rate, regular rhythm and intact distal pulses.   Pulmonary/Chest: Effort normal and breath sounds normal. No respiratory distress. She exhibits no tenderness.  Abdominal: Soft. She exhibits no distension. Hernia confirmed negative in the right inguinal area and confirmed negative in the left inguinal area.       Morbidly obese.  62m opening ostomy.  Probes 2cm deep.  No large cavity.  Thin serous fluid.   Packed with Nu gauze  Musculoskeletal: Normal range of motion. She exhibits no tenderness.  Lymphadenopathy:    She has no cervical adenopathy.       Right: No inguinal adenopathy present.       Left: No inguinal adenopathy present.  Neurological: She is alert and oriented to person, place, and time. No cranial nerve deficit. She exhibits normal muscle tone. Coordination normal.  Skin: Skin is warm and dry. No rash noted. She is not diaphoretic. No erythema.  Psychiatric: She has a normal mood and affect. Her behavior is normal. Judgment and thought content normal.       Assessment:     2 weeks status post colostomy takedown with a small seroma. Overall doing okay from the short period of time.    Plan:     Pack wound with wick once a day. Should close down over the next few weeks. Followup in two weeks for wound check and make sure she continues to improve.  Add fiber in diet. Diarrhea should calm down over time.  Walk regularly. She is interested in getting him back to doing that.  Follow up colonoscopy one  year after surgery. Perhaps delayed to 3 years out since it was a benign etiology. I would defer to Dr. Michail Sermon on this

## 2010-08-19 ENCOUNTER — Telehealth (INDEPENDENT_AMBULATORY_CARE_PROVIDER_SITE_OTHER): Payer: Self-pay

## 2010-08-19 NOTE — Telephone Encounter (Signed)
Pt calling c/o wound where colostomy takedown incision is draining with some odor so I made her an appt. To see Dr Johney Maine 08-20-10. Freida Busman

## 2010-08-20 ENCOUNTER — Encounter (INDEPENDENT_AMBULATORY_CARE_PROVIDER_SITE_OTHER): Payer: Self-pay | Admitting: Surgery

## 2010-08-20 ENCOUNTER — Ambulatory Visit (INDEPENDENT_AMBULATORY_CARE_PROVIDER_SITE_OTHER): Payer: 59 | Admitting: Surgery

## 2010-08-20 VITALS — BP 130/92 | HR 80 | Temp 96.3°F | Ht 61.0 in | Wt 176.2 lb

## 2010-08-20 DIAGNOSIS — K5730 Diverticulosis of large intestine without perforation or abscess without bleeding: Secondary | ICD-10-CM

## 2010-08-20 DIAGNOSIS — IMO0002 Reserved for concepts with insufficient information to code with codable children: Secondary | ICD-10-CM

## 2010-08-20 NOTE — Progress Notes (Signed)
Subjective:     Patient ID: Mariah [name], female   DOB: 04/12/1960, 50 y.o.   MRN: [id number]  HPI   Diagnosis: Diverticulitis with perforation status post colectomy and colostomy fiber 2012 by Dr. Georganna Skeans.  Surgery: Lap assisted colostomy takedown 07/28/2010 by Dr. Johney Maine  Reason for visit: Followup.  Patient notes difficulty packing the wound & pain.  There is still a mild amount of drainage that can be thick & milky. Her husband changes the dressing daily. No fevers, chills, sweats. Appetite is good.  More normal bowel movements.  Energy level better.   Review of Systems  Constitutional: Negative for fever, chills, diaphoresis, appetite change and fatigue.  HENT: Negative for ear pain, sore throat, trouble swallowing, neck pain and ear discharge.   Eyes: Negative for photophobia, discharge and visual disturbance.  Respiratory: Negative for cough, choking, chest tightness and shortness of breath.   Cardiovascular: Negative for chest pain and palpitations.  Gastrointestinal: Negative for nausea, vomiting, diarrhea, constipation, blood in stool, anal bleeding and rectal pain.       Soreness at old ostomy site with mild drainage  Genitourinary: Negative for dysuria, frequency and difficulty urinating.  Musculoskeletal: Negative for myalgias and gait problem.  Skin: Negative for color change, pallor and rash.  Neurological: Negative for dizziness, speech difficulty, weakness and numbness.  Hematological: Negative for adenopathy.  Psychiatric/Behavioral: Negative for confusion and agitation. The patient is not nervous/anxious.        Objective:   Physical Exam  Constitutional: She is oriented to person, place, and time. She appears well-developed and well-nourished. No distress.  HENT:  Head: Normocephalic.  Mouth/Throat: Oropharynx is clear and moist. No oropharyngeal exudate.  Eyes: Conjunctivae and EOM are normal. Pupils are equal, round, and reactive to light.  No scleral icterus.  Neck: Normal range of motion. Neck supple. No tracheal deviation present.  Cardiovascular: Normal rate, regular rhythm and intact distal pulses.   Pulmonary/Chest: Effort normal and breath sounds normal. No respiratory distress. She exhibits no tenderness.  Abdominal: Soft. She exhibits no distension. Hernia confirmed negative in the right inguinal area and confirmed negative in the left inguinal area.       Morbidly obese.  22m opening ostomy.  Probes 2cm deep.  No large cavity.  Mucus with serous fluid.   No pus.  I dilated the skin wound to 1cm & packed with Nu gauze.  Musculoskeletal: Normal range of motion. She exhibits no tenderness.  Lymphadenopathy:    She has no cervical adenopathy.       Right: No inguinal adenopathy present.       Left: No inguinal adenopathy present.  Neurological: She is alert and oriented to person, place, and time. No cranial nerve deficit. She exhibits normal muscle tone. Coordination normal.  Skin: Skin is warm and dry. No rash noted. She is not diaphoretic. No erythema.  Psychiatric: She has a normal mood and affect. Her behavior is normal. Judgment and thought content normal.       Assessment:     3 weeks status post colostomy takedown with a small seroma. Overall doing okay from the short period of time.    Plan:     Pack wound with wick once a day.  Followup next week for wound check and make sure she continues to improve. If the skin continues to close down, she may require excision of the skin under local anesthetic. I would like to hold off on that as I think the cavity  is closing down  Walk regularly. She is interested in getting him back to doing that.  Follow up colonoscopy one year after surgery. Perhaps delayed to 3 years out since it was a benign etiology. I would defer to Dr. Michail Sermon on this.

## 2010-08-20 NOTE — Patient Instructions (Signed)
Continue to pack wound.  If skin closes too soon, you may need re-incision to help the wound open

## 2010-08-26 ENCOUNTER — Encounter (INDEPENDENT_AMBULATORY_CARE_PROVIDER_SITE_OTHER): Payer: 59 | Admitting: Ophthalmology

## 2010-08-26 ENCOUNTER — Ambulatory Visit (INDEPENDENT_AMBULATORY_CARE_PROVIDER_SITE_OTHER): Payer: 59 | Admitting: Surgery

## 2010-08-26 VITALS — BP 130/88 | HR 68 | Temp 96.4°F | Ht 61.0 in | Wt 175.0 lb

## 2010-08-26 DIAGNOSIS — H2510 Age-related nuclear cataract, unspecified eye: Secondary | ICD-10-CM

## 2010-08-26 DIAGNOSIS — IMO0002 Reserved for concepts with insufficient information to code with codable children: Secondary | ICD-10-CM

## 2010-08-26 DIAGNOSIS — H43819 Vitreous degeneration, unspecified eye: Secondary | ICD-10-CM

## 2010-08-26 DIAGNOSIS — E11319 Type 2 diabetes mellitus with unspecified diabetic retinopathy without macular edema: Secondary | ICD-10-CM

## 2010-08-26 NOTE — Progress Notes (Signed)
Subjective:     Patient ID: Mariah [name], female   DOB: September 30, 1960, 50 y.o.   MRN: [id number]  HPI   Diagnosis: Diverticulitis with perforation status post colectomy and colostomy early 2012 by Dr. Georganna Skeans.  Surgery: Lap assisted colostomy takedown 07/28/2010 by Dr. Johney Maine  Reason for visit: Followup.  The patient feels okay.  Packing is easier.   husband changes the dressing daily. No fevers, chills, sweats. Appetite is good.  More normal bowel movements.  Energy level okay.   Review of Systems  Constitutional: Negative for fever, chills, diaphoresis, appetite change and fatigue.  HENT: Negative for ear pain, sore throat, trouble swallowing, neck pain and ear discharge.   Eyes: Negative for photophobia, discharge and visual disturbance.  Respiratory: Negative for cough, choking, chest tightness and shortness of breath.   Cardiovascular: Negative for chest pain and palpitations.  Gastrointestinal: Negative for nausea, vomiting, diarrhea, constipation, blood in stool, anal bleeding and rectal pain.       Soreness at old ostomy site with mild drainage  Genitourinary: Negative for dysuria, frequency and difficulty urinating.  Musculoskeletal: Negative for myalgias and gait problem.  Skin: Negative for color change, pallor and rash.  Neurological: Negative for dizziness, speech difficulty, weakness and numbness.  Hematological: Negative for adenopathy.  Psychiatric/Behavioral: Negative for confusion and agitation. The patient is not nervous/anxious.        Objective:   Physical Exam  Constitutional: She is oriented to person, place, and time. She appears well-developed and well-nourished. No distress.  HENT:  Head: Normocephalic.  Mouth/Throat: Oropharynx is clear and moist. No oropharyngeal exudate.  Eyes: Conjunctivae and EOM are normal. Pupils are equal, round, and reactive to light. No scleral icterus.  Neck: Normal range of motion. Neck supple. No tracheal  deviation present.  Cardiovascular: Normal rate, regular rhythm and intact distal pulses.   Pulmonary/Chest: Effort normal and breath sounds normal. No respiratory distress. She exhibits no tenderness.  Abdominal: Soft. She exhibits no distension. Hernia confirmed negative in the right inguinal area and confirmed negative in the left inguinal area.       Morbidly obese.  65m opening ostomy.  Probes 2cm deep.  Mild lateral deep cavity - smaller.  Packed with Nu gauze.  Musculoskeletal: Normal range of motion. She exhibits no tenderness.  Lymphadenopathy:    She has no cervical adenopathy.       Right: No inguinal adenopathy present.       Left: No inguinal adenopathy present.  Neurological: She is alert and oriented to person, place, and time. No cranial nerve deficit. She exhibits normal muscle tone. Coordination normal.  Skin: Skin is warm and dry. No rash noted. She is not diaphoretic. No erythema.  Psychiatric: She has a normal mood and affect. Her behavior is normal. Judgment and thought content normal.       Assessment:     Status post colostomy takedown one month with a small seroma. Overall doing okay from the short period of time.    Plan:     Pack wound with wick once a day.  Followup 2 weeks for wound check and make sure she continues to improve.   Follow up colonoscopy one year after surgery. Perhaps delayed to 3 years out since it was a benign etiology. I would defer to Dr. SMichail Sermonon this.

## 2010-08-28 ENCOUNTER — Telehealth (INDEPENDENT_AMBULATORY_CARE_PROVIDER_SITE_OTHER): Payer: Self-pay

## 2010-08-28 NOTE — Telephone Encounter (Signed)
Pt called requesting referral to wound care center for daily assistance with wound dsg changes. Pt states she spoke with her insurance carrier nurse and this is what they recommend. Pt states her husband is tired when he gets home from work and does not always have the energy to care for her wound. I reviewed this with Dr Johney Maine and as long as pt understands that insurance will not cover outside referral for this reason we can place an order for this. I advised pt of this and pt requests to continue to have her husband do wound care. Pt does not want to incur any outside charges.

## 2010-09-09 ENCOUNTER — Encounter (INDEPENDENT_AMBULATORY_CARE_PROVIDER_SITE_OTHER): Payer: Self-pay | Admitting: Surgery

## 2010-09-09 ENCOUNTER — Ambulatory Visit (INDEPENDENT_AMBULATORY_CARE_PROVIDER_SITE_OTHER): Payer: 59 | Admitting: Surgery

## 2010-09-09 VITALS — BP 140/94 | HR 66 | Temp 96.6°F | Ht 61.0 in | Wt 179.0 lb

## 2010-09-09 DIAGNOSIS — K5730 Diverticulosis of large intestine without perforation or abscess without bleeding: Secondary | ICD-10-CM

## 2010-09-09 DIAGNOSIS — IMO0002 Reserved for concepts with insufficient information to code with codable children: Secondary | ICD-10-CM

## 2010-09-09 NOTE — Progress Notes (Signed)
Subjective:     Patient ID: Mariah [name], female   DOB: 1960-08-23, 50 y.o.   MRN: [id number]  HPI   Diagnosis: Diverticulitis with perforation status post colectomy and colostomy early 2012 by Dr. Georganna Skeans.  Surgery: Lap assisted colostomy takedown 07/28/2010 by Dr. Johney Maine  Reason for visit: Followup.  The patient feels okay.  Packing is easier.   Her husband changes the dressing daily. No fevers, chills, sweats. Appetite is good.  Normal bowel movements.  Energy level better.   She notes soreness on her tailbone. Her appetite is better. She's concerned about her blood pressure. No nausea vomiting. She wants to get back to work but has to have long hours with occasional lifting.    Review of Systems  Constitutional: Negative for fever, chills, diaphoresis, appetite change and fatigue.  HENT: Negative for ear pain, sore throat, trouble swallowing, neck pain and ear discharge.   Eyes: Negative for photophobia, discharge and visual disturbance.  Respiratory: Negative for cough, choking, chest tightness and shortness of breath.   Cardiovascular: Negative for chest pain and palpitations.  Gastrointestinal: Negative for nausea, vomiting, diarrhea, constipation, blood in stool, anal bleeding and rectal pain.  Genitourinary: Negative for dysuria, frequency and difficulty urinating.  Musculoskeletal: Negative for myalgias and gait problem.  Skin: Negative for color change, pallor and rash.  Neurological: Negative for dizziness, speech difficulty, weakness and numbness.  Hematological: Negative for adenopathy.  Psychiatric/Behavioral: Negative for confusion and agitation. The patient is not nervous/anxious.        Objective:   Physical Exam  Constitutional: She is oriented to person, place, and time. She appears well-developed and well-nourished. No distress.  HENT:  Head: Normocephalic.  Mouth/Throat: Oropharynx is clear and moist. No oropharyngeal exudate.  Eyes:  Conjunctivae and EOM are normal. Pupils are equal, round, and reactive to light. No scleral icterus.  Neck: Normal range of motion. Neck supple. No tracheal deviation present.  Cardiovascular: Normal rate, regular rhythm and intact distal pulses.   Pulmonary/Chest: Effort normal and breath sounds normal. No respiratory distress. She exhibits no tenderness.  Abdominal: Soft. She exhibits no distension and no mass. There is no tenderness. Hernia confirmed negative in the right inguinal area and confirmed negative in the left inguinal area.       Morbidly obese.  43m opening ostomy.  Probes 1cm deep.  Packed with Nu gauze.    Musculoskeletal: Normal range of motion. She exhibits no tenderness.  Lymphadenopathy:    She has no cervical adenopathy.       Right: No inguinal adenopathy present.       Left: No inguinal adenopathy present.  Neurological: She is alert and oriented to person, place, and time. No cranial nerve deficit. She exhibits normal muscle tone. Coordination normal.  Skin: Skin is warm and dry. No rash noted. She is not diaphoretic. No erythema.  Psychiatric: She has a normal mood and affect. Her behavior is normal. Judgment and thought content normal.       Assessment:     Status post colostomy takedown 6wks with a small wound.  Gradually improving.    Plan:     Pack wound with wick once a day.  Followup 2 weeks for wound check and make sure she continues to improve.   Tylenol/Heat for tailbone soreness.  RTW 17Sep - note written.  Follow up colonoscopy one year after surgery. Perhaps delayed to 3 years out since it was a benign etiology. I would defer to Dr. SMichail Sermonon  this.

## 2010-09-23 ENCOUNTER — Ambulatory Visit (INDEPENDENT_AMBULATORY_CARE_PROVIDER_SITE_OTHER): Payer: 59 | Admitting: Surgery

## 2010-09-23 ENCOUNTER — Encounter (INDEPENDENT_AMBULATORY_CARE_PROVIDER_SITE_OTHER): Payer: Self-pay | Admitting: Surgery

## 2010-09-23 VITALS — BP 130/82 | HR 78 | Temp 98.6°F | Resp 16 | Ht 61.0 in | Wt 178.0 lb

## 2010-09-23 DIAGNOSIS — IMO0002 Reserved for concepts with insufficient information to code with codable children: Secondary | ICD-10-CM

## 2010-09-23 NOTE — Progress Notes (Signed)
Subjective:     Patient ID: Mariah [name], female   DOB: 08-Jan-1960, 50 y.o.   MRN: [id number]  HPI   Diagnosis: Diverticulitis with perforation status post colectomy and colostomy early 2012 by Dr. Georganna Skeans.  Surgery: Lap assisted colostomy takedown 07/28/2010 by Dr. Johney Maine  Reason for visit: Follow-up on wound seroma  The patient feels better.  Packing is easier.   Her husband changes the dressing daily. No fevers, chills, sweats. Appetite is good.  Normal bowel movements.  Energy level better.   She notes soreness on her tailbone. Her appetite is better. She's concerned about her blood pressure. No nausea vomiting. She wants to get back to work but has to have long hours with occasional lifting.    Review of Systems  Constitutional: Negative for fever, chills, diaphoresis, appetite change and fatigue.  HENT: Negative for ear pain, sore throat, trouble swallowing, neck pain and ear discharge.   Eyes: Negative for photophobia, discharge and visual disturbance.  Respiratory: Negative for cough, choking, chest tightness and shortness of breath.   Cardiovascular: Negative for chest pain and palpitations.  Gastrointestinal: Negative for nausea, vomiting, diarrhea, constipation, blood in stool, anal bleeding and rectal pain.  Genitourinary: Negative for dysuria, frequency and difficulty urinating.  Musculoskeletal: Negative for myalgias and gait problem.  Skin: Negative for color change, pallor and rash.  Neurological: Negative for dizziness, speech difficulty, weakness and numbness.  Hematological: Negative for adenopathy.  Psychiatric/Behavioral: Negative for confusion and agitation. The patient is not nervous/anxious.        Objective:   Physical Exam  Constitutional: She is oriented to person, place, and time. She appears well-developed and well-nourished. No distress.  HENT:  Head: Normocephalic.  Mouth/Throat: Oropharynx is clear and moist. No oropharyngeal  exudate.  Eyes: Conjunctivae and EOM are normal. Pupils are equal, round, and reactive to light. No scleral icterus.  Neck: Normal range of motion. Neck supple. No tracheal deviation present.  Cardiovascular: Normal rate, regular rhythm and intact distal pulses.   Pulmonary/Chest: Effort normal and breath sounds normal. No respiratory distress. She exhibits no tenderness.  Abdominal: Soft. She exhibits no distension and no mass. There is no tenderness. Hernia confirmed negative in the right inguinal area and confirmed negative in the left inguinal area.       Morbidly obese.  Incisions closed  Musculoskeletal: Normal range of motion. She exhibits no tenderness.  Lymphadenopathy:    She has no cervical adenopathy.       Right: No inguinal adenopathy present.       Left: No inguinal adenopathy present.  Neurological: She is alert and oriented to person, place, and time. No cranial nerve deficit. She exhibits normal muscle tone. Coordination normal.  Skin: Skin is warm and dry. No rash noted. She is not diaphoretic. No erythema.  Psychiatric: She has a normal mood and affect. Her behavior is normal. Judgment and thought content normal.       Assessment:     Status post colostomy takedown 2 months healed.  Gradually improving.    Plan:     RTC PRN.  She expressed appreciation.  Follow up colonoscopy 5 years since it was a benign etiology per pt/Dr. Michail Sermon w [facility].

## 2010-10-01 LAB — POCT I-STAT, CHEM 8
BUN: 11
Creatinine, Ser: 0.7
HCT: 43
Sodium: 136

## 2010-10-13 ENCOUNTER — Telehealth (INDEPENDENT_AMBULATORY_CARE_PROVIDER_SITE_OTHER): Payer: Self-pay

## 2010-10-13 NOTE — Telephone Encounter (Signed)
LMOM for pt to call me so I can find out what her symptoms are and if she needs to be seen by Dr Johney Maine.Freida Busman

## 2010-10-14 ENCOUNTER — Telehealth (INDEPENDENT_AMBULATORY_CARE_PROVIDER_SITE_OTHER): Payer: Self-pay

## 2010-10-14 NOTE — Telephone Encounter (Signed)
Returned [employer] message about having some redness and soreness at the colostomy takedown site. The pt said this area on abdomen is red and sore to touch. This started on Sunday and is not any better. 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 I made her an appt with Dr Zella Richer for 10-16-10.Mariah [name]

## 2010-10-16 ENCOUNTER — Ambulatory Visit (INDEPENDENT_AMBULATORY_CARE_PROVIDER_SITE_OTHER): Payer: 59 | Admitting: General Surgery

## 2010-10-16 VITALS — BP 132/86 | HR 78 | Temp 97.4°F | Resp 16 | Ht 61.0 in | Wt 180.2 lb

## 2010-10-16 DIAGNOSIS — R1032 Left lower quadrant pain: Secondary | ICD-10-CM

## 2010-10-16 LAB — URINALYSIS, ROUTINE W REFLEX MICROSCOPIC
Hgb urine dipstick: NEGATIVE
Ketones, ur: NEGATIVE
Protein, ur: NEGATIVE
Urobilinogen, UA: 1

## 2010-10-16 LAB — DIFFERENTIAL
Basophils Absolute: 0
Basophils Relative: 0
Eosinophils Absolute: 0
Monocytes Absolute: 0.6
Neutro Abs: 4.2
Neutrophils Relative %: 61

## 2010-10-16 LAB — COMPREHENSIVE METABOLIC PANEL
Albumin: 3.9
Alkaline Phosphatase: 55
BUN: 6
Chloride: 98
Glucose, Bld: 153 — ABNORMAL HIGH
Potassium: 4.1
Total Bilirubin: 0.5

## 2010-10-16 LAB — CBC
HCT: 40.9
Hemoglobin: 13.4
WBC: 6.9

## 2010-10-16 LAB — PREGNANCY, URINE: Preg Test, Ur: NEGATIVE

## 2010-10-16 NOTE — Patient Instructions (Signed)
You may do activities as tolerated as long as it does not cause you incisional pain.

## 2010-10-16 NOTE — Progress Notes (Signed)
She is status post colostomy closure by Dr. Johney Maine in July of 2012.  She was doing well until last week when she picked up a heavy box and felt some pain swelling and had redness around the previous colostomy site. She is currently feeling better now. No fever or chills.  Exam: Abdomen-midline incisions and left lower quadrant incisions are clean and intact; there is no erythema or drainage from the previous colostomy incision; is no hernia present at the previous colostomy site.  Assessment: Transient muscular pain from colostomy site  Plan: Activities as tolerated as long she does not have pain at the previous colostomy site.  Return visit p.r.n.

## 2011-01-07 ENCOUNTER — Emergency Department (INDEPENDENT_AMBULATORY_CARE_PROVIDER_SITE_OTHER)
Admission: EM | Admit: 2011-01-07 | Discharge: 2011-01-07 | Disposition: A | Discharge status: Home / Self Care | Payer: 59 | Source: Home / Self Care | Attending: Family Medicine | Admitting: Family Medicine

## 2011-01-07 ENCOUNTER — Encounter (HOSPITAL_COMMUNITY): Payer: Self-pay

## 2011-01-07 DIAGNOSIS — J329 Chronic sinusitis, unspecified: Secondary | ICD-10-CM

## 2011-01-07 MED ORDER — AZITHROMYCIN 250 MG PO TABS: 250.0000 mg | ORAL_TABLET | Freq: Every day | ORAL | Status: AC

## 2011-01-07 MED ORDER — HYDROCODONE-HOMATROPINE 5-1.5 MG/5ML PO SYRP: ORAL_SOLUTION | ORAL | Status: DC

## 2011-01-07 NOTE — ED Provider Notes (Signed)
History     CSN: [id number]  Arrival date & time 01/07/11  0841   First MD Initiated Contact with Patient 01/07/11 [phone]      Chief Complaint  Patient presents with  . Facial Pain  . Cough  . Sore Throat    (Consider location/radiation/quality/duration/timing/severity/associated sxs/prior treatment) HPI Comments: Onset yesterday with facial pain and pressure. Has some cough. No runny nose but does have pnd. Denies fever. Cough is occasionally productive. No blood in secretions. Admits to having a sore throat which is unusual for her. No tx pta.   Patient is a 51 y.o. female presenting with cough and pharyngitis. The history is provided by the patient.  Cough Associated symptoms include sore throat. Pertinent negatives include no ear pain and no rhinorrhea.  Sore Throat    Past Medical History  Diagnosis Date  . Asthma   . Diabetes mellitus   . Cough   . Infertility   . Fibroid   . GERD (gastroesophageal reflux disease)   . Depression   . Diverticulitis   . Allergy   . Body odor     from colostomy takedown    Past Surgical History  Procedure Date  . Abdominal surgery 2011    PERFORATED DIVERTICULUM  . Ventral hernia repair 2012  . Colon surgery 2012    sigmoid colectomy for perforated sigmoid diverticulitis  . Colostomy     TEMPORARY  . Colostomy takedown     Family History  Problem Relation Age of Onset  . Diabetes Mother   . Hypertension Mother   . Heart disease Mother   . Diabetes Maternal Grandmother   . Hypertension Maternal Grandmother   . Heart disease Maternal Grandmother   . Cancer Maternal Grandfather     COLON  . Cancer Paternal Grandmother     BREAST    History  Substance Use Topics  . Smoking status: Former Research scientist (life sciences)  . Smokeless tobacco: Not on file  . Alcohol Use: No     SOCIALLY    OB History    Grav Para Term Preterm Abortions TAB SAB Ect Mult Living                  Review of Systems  Constitutional: Negative.   HENT:  Positive for congestion, sore throat, facial swelling, sneezing, postnasal drip and sinus pressure. Negative for hearing loss, ear pain, rhinorrhea, neck pain, neck stiffness and voice change.   Respiratory: Positive for cough.   Gastrointestinal: Negative.   Genitourinary: Negative.     Allergies  Codeine  Home Medications   Current Outpatient Rx  Name Route Sig Dispense Refill  . VITAMIN C 100 MG PO TABS Oral Take 100 mg by mouth daily.      . ASPIRIN 81 MG PO TABS Oral Take 81 mg by mouth daily.      Marland Kitchen BIOTIN 1000 MCG PO TABS Oral Take 1,000 mcg by mouth 3 (three) times daily.      Marland Kitchen BLACK COHOSH 40 MG PO CAPS Oral Take by mouth 3 (three) times daily.      Marland Kitchen CALCIUM PLUS VITAMIN D PO Oral Take by mouth.      . ESCITALOPRAM OXALATE 20 MG PO TABS Oral Take 20 mg by mouth daily.      . OMEGA-3 FATTY ACIDS 1000 MG PO CAPS Oral Take 2 g by mouth daily.      Marland Kitchen FLUTICASONE-SALMETEROL 250-50 MCG/DOSE IN AEPB Inhalation Inhale 1 puff into the lungs as needed.      Marland Kitchen  FOLIC ACID PO Oral Take by mouth.      . L-METHYLFOLATE-B12-B6-B2 06-04-48-5 MG PO TABS Oral Take 1 tablet by mouth daily.      Marland Kitchen MONTELUKAST SODIUM 10 MG PO TABS Oral Take 10 mg by mouth as needed.      . MULTIVITAMIN PO Oral Take by mouth daily.      . OXYCODONE HCL 5 MG PO TABS  as needed.     Marland Kitchen PANTOPRAZOLE SODIUM 20 MG PO TBEC Oral Take 20 mg by mouth daily.      Marland Kitchen SITAGLIPTIN-METFORMIN HCL 50-1000 MG PO TABS Oral Take 1 tablet by mouth 2 (two) times daily with a meal.      . AZITHROMYCIN 250 MG PO TABS Oral Take 1 tablet (250 mg total) by mouth daily. Take first 2 tablets together, then 1 every day until finished. 6 tablet 0  . EPIPEN 2-PAK 0.3 MG/0.3ML IJ DEVI  Ad lib.    Marland Kitchen HYDROCODONE-HOMATROPINE 5-1.5 MG/5ML PO SYRP  1-2 tsp q 6 hrs prn cough 180 mL 0    BP 139/91  Pulse 90  Temp(Src) 98.9 F (37.2 C) (Oral)  Resp 20  SpO2 100%  Physical Exam  Nursing note and vitals reviewed. Constitutional: She appears  well-developed and well-nourished. No distress.  HENT:  Head: Normocephalic and atraumatic.  Right Ear: External ear normal.  Left Ear: External ear normal.  Nose: Nose normal.  Mouth/Throat: Oropharynx is clear and moist.       Frontal and maxillary tenderness  Neck: Normal range of motion. Neck supple.  Cardiovascular: Normal rate, regular rhythm and normal heart sounds.   Pulmonary/Chest: Effort normal and breath sounds normal. She has no wheezes.  Lymphadenopathy:    She has no cervical adenopathy.  Skin: Skin is warm and dry.    ED Course  Procedures (including critical care time)  Labs Reviewed - No data to display No results found.   1. Sinusitis       MDM          Luna Glasgow, MD 01/07/11 [phone]

## 2011-01-07 NOTE — ED Notes (Signed)
C/o facial pain, sinus pressure, post nasal drainage, sore throat, productive cough of yellow sputum since yesterday.  Denies fever.

## 2011-01-08 ENCOUNTER — Other Ambulatory Visit: Payer: Self-pay | Admitting: Family Medicine

## 2011-01-08 DIAGNOSIS — R921 Mammographic calcification found on diagnostic imaging of breast: Secondary | ICD-10-CM

## 2011-01-14 ENCOUNTER — Ambulatory Visit
Admission: RE | Admit: 2011-01-14 | Discharge: 2011-01-14 | Disposition: A | Discharge status: Home / Self Care | Payer: 59 | Source: Ambulatory Visit | Attending: Family Medicine | Admitting: Family Medicine

## 2011-01-14 DIAGNOSIS — R921 Mammographic calcification found on diagnostic imaging of breast: Secondary | ICD-10-CM

## 2011-02-26 ENCOUNTER — Ambulatory Visit (INDEPENDENT_AMBULATORY_CARE_PROVIDER_SITE_OTHER): Payer: 59 | Admitting: Ophthalmology

## 2011-03-31 ENCOUNTER — Ambulatory Visit (INDEPENDENT_AMBULATORY_CARE_PROVIDER_SITE_OTHER): Payer: 59 | Admitting: Ophthalmology

## 2011-09-17 ENCOUNTER — Other Ambulatory Visit: Payer: Self-pay | Admitting: Family Medicine

## 2011-09-17 DIAGNOSIS — R921 Mammographic calcification found on diagnostic imaging of breast: Secondary | ICD-10-CM

## 2011-10-05 ENCOUNTER — Ambulatory Visit
Admission: RE | Admit: 2011-10-05 | Discharge: 2011-10-05 | Disposition: A | Discharge status: Home / Self Care | Payer: 59 | Source: Ambulatory Visit | Attending: Family Medicine | Admitting: Family Medicine

## 2011-10-05 DIAGNOSIS — R921 Mammographic calcification found on diagnostic imaging of breast: Secondary | ICD-10-CM

## 2011-10-08 ENCOUNTER — Ambulatory Visit (INDEPENDENT_AMBULATORY_CARE_PROVIDER_SITE_OTHER): Payer: 59 | Admitting: Women's Health

## 2011-10-08 ENCOUNTER — Encounter: Payer: Self-pay | Admitting: Women's Health

## 2011-10-08 VITALS — BP 120/74 | Ht 61.0 in | Wt 191.0 lb

## 2011-10-08 DIAGNOSIS — B3731 Acute candidiasis of vulva and vagina: Secondary | ICD-10-CM

## 2011-10-08 DIAGNOSIS — E1169 Type 2 diabetes mellitus with other specified complication: Secondary | ICD-10-CM | POA: Insufficient documentation

## 2011-10-08 DIAGNOSIS — E119 Type 2 diabetes mellitus without complications: Secondary | ICD-10-CM

## 2011-10-08 DIAGNOSIS — B373 Candidiasis of vulva and vagina: Secondary | ICD-10-CM

## 2011-10-08 DIAGNOSIS — K219 Gastro-esophageal reflux disease without esophagitis: Secondary | ICD-10-CM

## 2011-10-08 DIAGNOSIS — J45909 Unspecified asthma, uncomplicated: Secondary | ICD-10-CM

## 2011-10-08 DIAGNOSIS — Z01419 Encounter for gynecological examination (general) (routine) without abnormal findings: Secondary | ICD-10-CM

## 2011-10-08 LAB — WET PREP FOR TRICH, YEAST, CLUE: Yeast Wet Prep HPF POC: NONE SEEN

## 2011-10-08 NOTE — Patient Instructions (Addendum)
Vit D 2000 daily Health Recommendations for Postmenopausal Women Based on the Results of the [facility]) and Other Studies The WHI is a major 15-year research program to address the most common causes of death, disability and poor quality of life in postmenopausal women. Some of these causes are heart disease, cancer, bone loss (osteoporosis) and others. Taking into account all of the findings from [facility] and other studies, here are bottom-line health recommendations for women: CARDIOVASCULAR DISEASE Heart Disease: A heart attack is a medical emergency. Know the signs and symptoms of a heart attack. Hormone therapy should not be used to prevent heart disease. In women with heart disease, hormone therapy should not be used to prevent further disease. Hormone therapy increases the risk of blood clots. Below are things women can do to reduce their risk for heart disease.   Do not smoke. If you smoke, quit. Women who smoke are 2 to 6 times more likely to suffer a heart attack than non-smoking women.  Aim for a healthy weight. Being overweight causes many preventable deaths. Eat a healthy and balanced diet and drink an adequate amount of liquids.  Get moving. Make a commitment to be more physically active. Aim for 30 minutes of activity on most, if not all days of the week.  Eat for heart health. Choose a diet that is low in saturated fat, trans fat, and cholesterol. Include whole grains, vegetables, and fruits. Read the labels on the food container before buying it.  Know your numbers. Ask your caregiver to check your blood pressure, cholesterol (total, HDL, LDL, triglycerides) and blood glucose. Work with your caregiver to improve any numbers that are not normal.  High blood pressure. Limit or stop your table salt intake (try salt substitute and food seasonings), avoid salty foods and drinks. Read the labels on the food container before buying it. Avoid becoming overweight by eating  well and exercising. STROKE  Stroke is a medical emergency. Stroke can be the result of a blood clot in the blood vessel in the brain or by a brain hemorrhage (bleeding). Know the signs and symptoms of a stroke. To lower the risk of developing a stroke:  Avoid fatty foods.  Quit smoking.  Control your diabetes, blood pressure, and irregular heart rate. THROMBOPHLIBITIS (BLOOD CLOT) OF THE LEG  Hormone treatment is a big cause of developing blood clots in the leg. Becoming overweight and leading a stationary lifestyle also may contribute to developing blood clots. Controlling your diet and exercising will help lower the risk of developing blood clots. CANCER SCREENING  Breast Cancer: Women should take steps to reduce their risk of breast cancer. This includes having regular mammograms, monthly self breast exams and regular breast exams by your caregiver. Have a mammogram every one to two years if you are 51 to 51 years old. Have a mammogram annually if you are 51 years old or older depending on your risk factors. Women who are high risk for breast cancer may need more frequent mammograms. There are tests available (testing the genes in your body) if you have family history of breast cancer called BRCA 1 and 2. These tests can help determine the risks of developing breast cancer.  Intestinal or Stomach Cancer: Women should talk to their caregiver about when to start screening, what tests and how often they should be done, and the benefits and risks of doing these tests. Tests to consider are a rectal exam, fecal occult blood, sigmoidoscopy, colononoscoby, barium enema  and upper GI series of the stomach. Depending on the age, you may want to get a medical and family history of colon cancer. Women who are high risk may need to be screened at an earlier age and more often.  Cervical Cancer: A Pap test of the cervix should be done every year and every 3 years when there has been three straight years of a  normal Pap test. Women with an abnormal Pap test should be screened more often or have a cervical biopsy depending on your caregiver's recommendation.  Uterine Cancer: If you have vaginal bleeding after you are in the menopause, it should be evaluated by your caregiver.  Ovarian cancer: There are no reliable tests available to screen for ovarian cancer at this time except for yearly pelvic exams.  Lung Cancer: Yearly chest X-rays can detect lung cancer and should be done on high risk women, such as cigarette smokers and women with chronic lung disease (emphysemia).  Skin Cancer: A complete body skin exam should be done at your yearly examination. Avoid overexposure to the sun and ultraviolet light lamps. Use a strong sun block cream when in the sun. All of these things are important in lowering the risk of skin cancer. MENOPAUSE Menopause Symptoms: Hormone therapy products are effective for treating symptoms associated with menopause:  Moderate to severe hot flashes.  Night sweats.  Mood swings.  Headaches.  Tiredness.  Loss of sex drive.  Insomnia.  Other symptoms. However, hormone therapy products carry serious risks, especially in older women. Women who use or are thinking about using estrogen or estrogen with progestin treatments should discuss that with their caregiver. Your caregiver will know if the benefits outweigh the risks. The Food and Drug Administration (FDA) has concluded that hormone therapy should be used only at the lowest doses and for the shortest amount of time to reach treatment goals. It is not known at what doses there may be less risk of serious side effects. There are other treatments such as herbal medication (not controlled or regulated by the FDA), group therapy, counseling and acupuncture that may be helpful. OSTEOPOROSIS Protecting Against Bone Loss and Preventing Fracture: If hormone therapy is used for prevention of bone loss (osteoporosis), the risks for  bone loss must outweigh the risk of the therapy. Women considering taking hormone therapy for bone loss should ask their health care providers about other medications (fosamax and boniva) that are considered safe and effective for preventing bone loss and bone fractures. To guard against bone loss or fractures, it is recommended that women should take at least 1000-1500 mg of calcium and 400-800 IU of vitamin D daily in divided doses. Smoking and excessive alcohol intake increases the risk of osteoporosis. Eat foods rich in calcium and vitamin D and do weight bearing exercises several times a week as your caregiver suggests. DIABETES Diabetes Melitus: Women with Type I or Type 2 diabetes should keep their diabetes in control with diet, exercise and medication. Avoid too many sweets, starchy and fatty foods. Being overweight can affect your diabetes. COGNITION AND MEMORY Cognition and Memory: Menopausal hormone therapy is not recommended for the prevention of cognitive disorders such as Alzheimer's disease or memory loss. WHI found that women treated with hormone therapy have a greater risk of developing dementia.  DEPRESSION  Depression may occur at any age, but is common in elderly women. The reasons may be because of physical, medical, social (loneliness), financial and/or economic problems and needs. Becoming involved with church,  volunteer or social groups, seeking treatment for any physical or medical problems is recommended. Also, look into getting professional advice for any economic or financial problems. ACCIDENTS  Accidents are common and can be serious in the elderly woman. Prepare your house to prevent accidents. Eliminate throw rugs, use hip protectors, place hand bars in the bath, shower and toilet areas. Avoid wearing high heel shoes and walking on wet, snowy and icy areas. Stop driving if you have vision, hearing problems or are unsteady with you movements and reflexes. RHEUMATOID  ARTHRITIS Rheumatoid arthritis causes pain, swelling and stiffness of your bone joints. It can limit many of your activities. Over-the-counter medications may help, but prescription medications may be necessary. Talk with your caregiver about this. Exercise (walking, water aerobics), good posture, using splints on painful joints, warm baths or applying warm compresses to stiff joints and cold compresses to painful joints may be helpful. Smoking and excessive drinking may worsen the symptoms of arthritis. Seek help from a physical therapist if the arthritis is becoming a problem with your daily activities. IMMUNIZATIONS  Several immunizations are important to have during your senior years, including:   Tetanus and a diptheria shot booster every 10 years.  Influenza every year before the flu season begins.  Pneumonia vaccine.  Shingles vaccine.  Others as indicated (example: H1N1 vaccine). Document Released: 02/12/2005 Document Revised: 03/15/2011 Document Reviewed: 10/09/2007 [facility] Patient Information 2013 [place].

## 2011-10-08 NOTE — Progress Notes (Signed)
Mariah [name] 1960-02-21 [id number]    History:    The patient presents for annual exam.  Postmenopausal with occasional hot flushes, amenorrheic since January 2012(FSH 25 05/2010). History of diabetes, asthma, GERD, diverticulitis. History of a perforated diverticulum 2011, had a colostomy in 2012 that was reversed July of 2012. History of normal Paps. Mammogram had followup that showed stability and is now back to a one year rotation. History of multiple small intramural fibroids.    Past medical history, past surgical history, family history and social history were all reviewed and documented in the EPIC chart. Son [name] 15 doing well. Works in a Recruitment consultant.   ROS:  A  ROS was performed and pertinent positives and negatives are included in the history.  Exam:  Filed Vitals:   10/08/11 1053  BP: 120/74    General appearance:  Normal Head/Neck:  Normal, without cervical or supraclavicular adenopathy. Thyroid:  Symmetrical, normal in size, without palpable masses or nodularity. Respiratory  Effort:  Normal  Auscultation:  Clear without wheezing or rhonchi Cardiovascular  Auscultation:  Regular rate, without rubs, murmurs or gallops  Edema/varicosities:  Not grossly evident Abdominal  Soft,nontender, without masses, guarding or rebound.  Liver/spleen:  No organomegaly noted  Hernia:  None appreciated  Skin  Inspection:  Grossly normal  Palpation:  Grossly normal Neurologic/psychiatric  Orientation:  Normal with appropriate conversation.  Mood/affect:  Normal  Genitourinary    Breasts: Examined lying and sitting.     Right: Without masses, retractions, discharge or axillary adenopathy.     Left: Without masses, retractions, discharge or axillary adenopathy.   Inguinal/mons:  Normal without inguinal adenopathy  External genitalia:  Normal  BUS/Urethra/Skene's glands:  Normal  Bladder:  Normal  Vagina:  Normal  Cervix:  Normal  Uterus:   Bulky/fibroids  history .  Midline and mobile  Adnexa/parametria:     Rt: Without masses or tenderness.   Lt: Without masses or tenderness.  Anus and perineum: Normal  Digital rectal exam: Normal sphincter tone without palpated masses or tenderness  Assessment/Plan:  51 y.o. MBF G0 1 adopted son for annual exam with no complaints other than occasional vaginal irritation/dryness.  Normal postmenopausal exam with minimal vaginal atrophy Diabetes Dr. Chalmers Cater Asthma/GERD/depression-primary care Obesity  Plan: SBE's, annual mammogram, calcium rich diet, vitamin D 2000 daily. 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 Reviewed importance of watching calories/carbohydrates to avoid increased weight gain. UA only, no Pap history of normal Paps. Encouraged vaginal lubricants for intercourse, wet prep was negative.    [name] [facility], 12:53 PM 10/08/2011

## 2011-10-09 LAB — URINALYSIS W MICROSCOPIC + REFLEX CULTURE
Bacteria, UA: NONE SEEN
Bilirubin Urine: NEGATIVE
Casts: NONE SEEN
Crystals: NONE SEEN
Glucose, UA: NEGATIVE mg/dL
Hgb urine dipstick: NEGATIVE
Ketones, ur: NEGATIVE mg/dL
pH: 5.5 (ref 5.0–8.0)

## 2012-08-03 ENCOUNTER — Inpatient Hospital Stay (HOSPITAL_COMMUNITY)
Admission: AD | Admit: 2012-08-03 | Discharge: 2012-08-03 | Disposition: A | Discharge status: Home / Self Care | Payer: 59 | Source: Ambulatory Visit | Attending: Gynecology | Admitting: Gynecology

## 2012-08-03 ENCOUNTER — Encounter (HOSPITAL_COMMUNITY): Payer: Self-pay | Admitting: *Deleted

## 2012-08-03 DIAGNOSIS — B3731 Acute candidiasis of vulva and vagina: Secondary | ICD-10-CM | POA: Insufficient documentation

## 2012-08-03 DIAGNOSIS — L293 Anogenital pruritus, unspecified: Secondary | ICD-10-CM | POA: Insufficient documentation

## 2012-08-03 DIAGNOSIS — N949 Unspecified condition associated with female genital organs and menstrual cycle: Secondary | ICD-10-CM | POA: Insufficient documentation

## 2012-08-03 DIAGNOSIS — B373 Candidiasis of vulva and vagina: Secondary | ICD-10-CM | POA: Insufficient documentation

## 2012-08-03 LAB — WET PREP, GENITAL: Clue Cells Wet Prep HPF POC: NONE SEEN

## 2012-08-03 NOTE — MAU Provider Note (Signed)
History     CSN: [id number]  Arrival date and time: 08/03/12 2113   None     Chief Complaint  Patient presents with  . Vaginal Discharge   HPI This is a 52 y.o. female who presents with c/o vaginal itching, worse today after using Monistat.  Is followed for GYN by Elon Alas NP.   RN Note: Pt states she started having itching yesterday and became more intense today after using Monistat 1 day       OB History   Grav Para Term Preterm Abortions TAB SAB Ect Mult Living   0               Past Medical History  Diagnosis Date  . Asthma   . Diabetes mellitus   . Cough   . Infertility   . Fibroid   . GERD (gastroesophageal reflux disease)   . Depression   . Diverticulitis   . Allergy   . Body odor     from colostomy takedown    Past Surgical History  Procedure Laterality Date  . Abdominal surgery  2011    PERFORATED DIVERTICULUM  . Ventral hernia repair  2012  . Colon surgery  2012    sigmoid colectomy for perforated sigmoid diverticulitis  . Colostomy      TEMPORARY  . Colostomy takedown      Family History  Problem Relation Age of Onset  . Diabetes Mother   . Hypertension Mother   . Heart disease Mother   . Diabetes Maternal Grandmother   . Hypertension Maternal Grandmother   . Heart disease Maternal Grandmother   . Cancer Maternal Grandfather     COLON  . Breast cancer Paternal Grandmother     Age 10's    History  Substance Use Topics  . Smoking status: Former Research scientist (life sciences)  . Smokeless tobacco: Not on file  . Alcohol Use: No    Allergies:  Allergies  Allergen Reactions  . Codeine Hives    Prescriptions prior to admission  Medication Sig Dispense Refill  . albuterol (PROVENTIL HFA;VENTOLIN HFA) 108 (90 BASE) MCG/ACT inhaler Inhale 2 puffs into the lungs every 6 (six) hours as needed (asthma).      . Biotin 1000 MCG tablet Take 1,000 mcg by mouth 2 (two) times daily.       . Canagliflozin (INVOKANA) 100 MG TABS Take 100 mg by mouth daily.       Marland Kitchen escitalopram (LEXAPRO) 20 MG tablet Take 20 mg by mouth daily.        . ferrous sulfate 325 (65 FE) MG tablet Take 325 mg by mouth daily.      Marland Kitchen glimepiride (AMARYL) 4 MG tablet Take 4 mg by mouth 2 (two) times daily.      Marland Kitchen lisinopril (PRINIVIL,ZESTRIL) 20 MG tablet Take 20 mg by mouth daily.      . pantoprazole (PROTONIX) 20 MG tablet Take 20 mg by mouth 2 (two) times daily.       . Psyllium (METAMUCIL PO) Take 1 capsule by mouth daily.      . sitaGLIPtan-metformin (JANUMET) 50-1000 MG per tablet Take 1 tablet by mouth 2 (two) times daily with a meal.        . vitamin C (ASCORBIC ACID) 500 MG tablet Take 500 mg by mouth every other day.      Marland Kitchen EPIPEN 2-PAK 0.3 MG/0.3ML DEVI Ad lib.      . montelukast (SINGULAIR) 10 MG tablet Take 10 mg by  mouth as needed (asthma).         Review of Systems  Constitutional: Negative for fever, chills and malaise/fatigue.  Gastrointestinal: Negative for nausea, vomiting, abdominal pain, diarrhea and constipation.       Vaginal itching  Neurological: Negative for dizziness.   Physical Exam   Blood pressure 110/70, pulse 81, temperature 98.6 F (37 C), temperature source Oral, resp. rate 16.  Physical Exam  Constitutional: She is oriented to person, place, and time. She appears well-developed and well-nourished. No distress.  Cardiovascular: Normal rate.   Respiratory: Effort normal.  GI: Soft. There is no tenderness.  Genitourinary: Vaginal discharge (creamy white discharge, c/w monistat) found.  Musculoskeletal: Normal range of motion.  Neurological: She is alert and oriented to person, place, and time.  Skin: Skin is warm and dry.  Psychiatric: She has a normal mood and affect.    MAU Course  Procedures  MDM Results for orders placed during the hospital encounter of 08/03/12 (from the past 24 hour(s))  WET PREP, GENITAL     Status: Abnormal   Collection Time    08/03/12 10:35 PM      Result Value Range   Yeast Wet Prep HPF POC NONE  SEEN  NONE SEEN   Trich, Wet Prep NONE SEEN  NONE SEEN   Clue Cells Wet Prep HPF POC NONE SEEN  NONE SEEN   WBC, Wet Prep HPF POC FEW (*) NONE SEEN     Assessment and Plan  A:  Yeast vaginitis, currently treated       Itching/irritation may be due to yeast or possibly Monistat I  P:  Discussed with Dr Charlies Constable       Discharge home       Cool compresses       Discussed sometimes Monistat I will be irritating due to higher concentration. Yeast is probably adequately treated. Followup with your NP if not better in 1-2 days.   [facility] 08/03/2012, 9:54 PM

## 2012-08-03 NOTE — MAU Note (Signed)
Pt states she started having itching yesterday and became more intense today after using Monistat 1 day

## 2012-08-04 NOTE — MAU Provider Note (Signed)
Agree with management, case discussed

## 2012-09-14 ENCOUNTER — Other Ambulatory Visit: Payer: Self-pay | Admitting: Family Medicine

## 2012-09-14 DIAGNOSIS — R921 Mammographic calcification found on diagnostic imaging of breast: Secondary | ICD-10-CM

## 2012-09-15 ENCOUNTER — Telehealth: Payer: Self-pay | Admitting: *Deleted

## 2012-09-15 DIAGNOSIS — Z13820 Encounter for screening for osteoporosis: Secondary | ICD-10-CM

## 2012-09-15 NOTE — Telephone Encounter (Signed)
Pt called requesting order to be [facility], per nancy note on 10/07/12 this will be done.

## 2012-10-05 ENCOUNTER — Ambulatory Visit
Admission: RE | Admit: 2012-10-05 | Discharge: 2012-10-05 | Disposition: A | Discharge status: Home / Self Care | Payer: 59 | Source: Ambulatory Visit | Attending: Family Medicine | Admitting: Family Medicine

## 2012-10-05 DIAGNOSIS — R921 Mammographic calcification found on diagnostic imaging of breast: Secondary | ICD-10-CM

## 2012-10-10 ENCOUNTER — Other Ambulatory Visit: Payer: Self-pay | Admitting: Gynecology

## 2012-10-10 ENCOUNTER — Encounter: Payer: Self-pay | Admitting: Women's Health

## 2012-10-10 ENCOUNTER — Ambulatory Visit (INDEPENDENT_AMBULATORY_CARE_PROVIDER_SITE_OTHER): Payer: 59 | Admitting: Women's Health

## 2012-10-10 ENCOUNTER — Other Ambulatory Visit (HOSPITAL_COMMUNITY)
Admission: RE | Admit: 2012-10-10 | Discharge: 2012-10-10 | Disposition: A | Discharge status: Home / Self Care | Payer: 59 | Source: Ambulatory Visit | Attending: Gynecology | Admitting: Gynecology

## 2012-10-10 VITALS — BP 128/70 | Ht 60.75 in | Wt 187.2 lb

## 2012-10-10 DIAGNOSIS — Z780 Asymptomatic menopausal state: Secondary | ICD-10-CM

## 2012-10-10 DIAGNOSIS — Z01419 Encounter for gynecological examination (general) (routine) without abnormal findings: Secondary | ICD-10-CM | POA: Insufficient documentation

## 2012-10-10 DIAGNOSIS — Z13820 Encounter for screening for osteoporosis: Secondary | ICD-10-CM

## 2012-10-10 NOTE — Patient Instructions (Signed)
1500 Calorie Diabetic Diet The 1500 calorie diabetic diet limits calories to 1500 each day. Following this diet and making healthy meal choices can help improve overall health. It controls blood glucose (sugar) levels and can also help lower blood pressure and cholesterol.  SERVING SIZES Measuring foods and serving sizes helps to make sure you are getting the right amount of food. The list below tells how big or small some common serving sizes are.  1 oz.........4 stacked dice.  3 oz........Marland KitchenDeck of cards.  1 tsp.......Marland KitchenTip of little finger.  1 tbs......Marland KitchenMarland KitchenThumb.  2 tbs.......Marland KitchenGolf ball.   cup......Marland KitchenHalf of a fist.  1 cup.......Marland KitchenA fist. GUIDELINES FOR CHOOSING FOODS The goal of this diet is to eat a variety of foods and limit calories to 1500 each day. This can be done by choosing foods that are low in calories and fat. The diet also suggests eating small amounts of food frequently. Doing this helps control your blood glucose levels, so they do not get too high or too low. Each meal or snack may include a protein food source to help you feel more satisfied. Try to eat about the same amount of food around the same time each day. This includes weekend days, travel days, and days off work. Space your meals about 4 to 5 hours apart, and add a snack between them, if you wish.  For example, a daily food plan could include breakfast, a morning snack, lunch, dinner, and an evening snack. Healthy meals and snacks have different types of foods, including whole grains, vegetables, fruits, lean meats, poultry, fish, and dairy products. As you plan your meals, select a variety of foods. Choose from the bread and starch, vegetable, fruit, dairy, and meat/protein groups. Examples of foods from each group are listed below, with their suggested serving sizes. Use measuring cups and spoons to become familiar with what a healthy portion looks like. Bread and Starch Each serving equals 15 grams of  carbohydrate.  1 slice bread.   bagel.   cup cold cereal (unsweetened).   cup hot cereal or mashed potatoes.  1 small potato (size of a computer mouse).   cup cooked pasta or rice.   English muffin.  1 cup broth-based soup.  3 cups of popcorn.  4 to 6 whole-wheat crackers.   cup cooked beans, peas, or corn. Vegetables Each serving equals 5 grams of carbohydrate.   cup cooked vegetables.  1 cup raw vegetables.   cup tomato or vegetable juice. Fruit Each serving equals 15 grams of carbohydrate.  1 small apple or orange.  1  cup watermelon or strawberries.   cup applesauce (no sugar added).  2 tbs raisins.   banana.   cup canned fruit, packed in water or in its own juice.   cup unsweetened fruit juice. Dairy Each serving equals 12 to 15 grams of carbohydrate.  1 cup fat-free milk.  6 oz artificially sweetened yogurt or plain yogurt.  1 cup low-fat buttermilk.  1 cup soy milk.  1 cup almond milk. Meat/Protein  1 large egg.  2 to 3 oz meat, poultry, or fish.   cup low-fat cottage cheese.  1 tbs peanut butter.  1 oz low-fat cheese.   cup tuna, packed in water.   cup tofu. Fat  1 tsp oil.  1 tsp trans-fat-free margarine.  1 tsp butter.  1 tsp mayonnaise.  2 tbs avocado.  1 tbs salad dressing.  1 tbs cream cheese.  2 tbs sour cream. SAMPLE 1500 CALORIE DIET  PLAN Breakfast   whole-wheat English muffin (1 carb serving).  1 tsp trans-fat-free margarine.  1 scrambled egg.  1 cup fat-free milk (1 carb serving).  1 small orange (1 carb serving). Lunch  Chicken wrap.  1 whole-wheat tortilla, 8-inch (1 carb servings).  2 oz chicken breast, sliced.  2 tbs low-fat salad dressing, such as [place].   cup shredded lettuce.  2 slices tomato.   cup carrot sticks.  1 small apple (1 carb serving). Afternoon Snack  3 graham cracker squares (1 carb serving).  1 tbs peanut butter. Dinner  2 oz lean  pork chop, broiled.  1 cup brown rice (3 carb servings).   cup steamed carrots.   cup green beans.  1 cup fat-free milk (1 carb serving).  1 tsp trans-fat-free margarine. Evening Snack   cup low-fat cottage cheese.  1 small peach or pear, sliced (or  cup canned in water) (1 carb serving). MEAL PLAN You can use this worksheet to help you make a daily meal plan based on the 1500 calorie diabetic diet suggestions. If you are using this plan to help you control your blood glucose, you may interchange carbohydrate containing foods (dairy, starches, and fruits). Select a variety of fresh foods of varying colors and flavors. The total amount of carbohydrate in your meals or snacks is more important than making sure you include all of the food groups every time you eat. You can choose from approximately this many of the following foods to build your day's meals:  6 Starches.  3 Vegetables.  2 Fruits.  2 Dairy.  4 to 6 oz Meat/Protein.  Up to 3 Fats. Your dietician can use this worksheet to help you decide how many servings and which types of foods are right for you. BREAKFAST Food Group and Servings / Food Choice Starch _________________________________________________________ Dairy __________________________________________________________ Fruit ___________________________________________________________ Meat/Protein____________________________________________________ Fat ____________________________________________________________ LUNCH Food Group and Servings / Food Choice  Starch _________________________________________________________ Meat/Protein ___________________________________________________ Vegetables _____________________________________________________ Fruit __________________________________________________________ Dairy __________________________________________________________ Fat ____________________________________________________________ Mariah [name] Food Group and Servings / Food Choice Dairy __________________________________________________________ Starch _________________________________________________________ Meat/Protein____________________________________________________ Mariah [name] ___________________________________________________________ Mariah [name] Food Group and Servings / Food Choice Starch _________________________________________________________ Meat/Protein ___________________________________________________ Dairy __________________________________________________________ Vegetable ______________________________________________________ Fruit ___________________________________________________________ Fat ____________________________________________________________ Mariah [name] Food Group and Servings / Food Choice Fruit ___________________________________________________________ Meat/Protein ____________________________________________________ Dairy __________________________________________________________ Starch __________________________________________________________ DAILY TOTALS Starches _________________________ Vegetables _______________________ Fruits ____________________________ Dairy ____________________________ Meat/Protein_____________________ Fats _____________________________ Document Released: 07/13/2004 Document Revised: 03/15/2011 Document Reviewed: 11/07/2008 Exit[employer] Patient Information 2014 [employer].

## 2012-10-10 NOTE — Progress Notes (Signed)
Mariah [name] 07/27/1960 [id number]  History:    The patient presents for annual exam.   Postmenopausal/no bleeding/no HRT.No new medical issues of concern.  Diabetes, recently started on NovoLog, last hemoglobin A1c was 9.8. Hypertension controlled with 20 mg lisinopril.  History of small uterine fibroids.  Normal Pap and  Mammogram history.  DEXA scan not performed.   Past medical history, past surgical history, family history and social history were all reviewed and documented in the EPIC chart. 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 Perforated diverticulum, colostomy with reversal in 2012  16 yo son, [name] healthy Mother Diabetes age 40; HTN Maternal grandmother Diabetes age 21; HTN  ROS:  A  ROS was performed and pertinent positives and negatives are included in the history.  Exam:  Filed Vitals:   10/10/12 0825  BP: 128/70    General appearance: Obese  Head/Neck:  Normal, without cervical or supraclavicular adenopathy. Thyroid:  Symmetrical, normal in size, without palpable masses or nodularity. Respiratory  Effort:  Normal  Auscultation:  Clear without wheezing or rhonchi Cardiovascular  Auscultation:  Regular rate, without rubs, murmurs or gallops  Edema/varicosities:  Not grossly evident Abdominal  Soft,nontender, without masses, guarding or rebound.  Liver/spleen:  No organomegaly noted  Hernia:  None appreciated  Skin  Inspection:  Grossly normal  Palpation:  Grossly normal Neurologic/psychiatric  Orientation:  Normal with appropriate conversation.  Mood/affect:  Normal  Genitourinary    Breasts: Examined lying and sitting.     Right: Without masses, retractions, discharge or axillary adenopathy.     Left: Without masses, retractions, discharge or axillary adenopathy.   Inguinal/mons:  Normal without inguinal adenopathy  External genitalia:  Normal  BUS/Urethra/Skene's glands:  Normal  Bladder:  Normal  Vagina:  Normal  Cervix:  Normal  without CMT or tenderness  Uterus:  Normal in size, shape and contour.  Midline and mobile  Adnexa/parametria:     Rt: Without masses or tenderness.   Lt: Without masses or tenderness.  Anus and perineum: Normal  Digital rectal exam: Normal sphincter tone without palpated masses or tenderness  Assessment/Plan:  52 y.o. MBF G0  1 adopted son for annual exam with no complaints.     Postmenopausal/ no HRT/no bleeding HTN-PC labs and meds Diabetes - Dr Chalmers Cater manages Obesity  Plan:   SBEs, continue annual mammogram,  heart healthy low-sodium, low carbohydrate diet, and exercise encouraged for weight loss. Vit D 2,000 units daily. DEXA, will schedule here. Pap. Pap normal 2012, new screening guidelines reviewed.     [name] [facility], 8:51 AM 10/10/2012

## 2012-10-11 LAB — URINALYSIS W MICROSCOPIC + REFLEX CULTURE
Bilirubin Urine: NEGATIVE
Casts: NONE SEEN
Crystals: NONE SEEN
Glucose, UA: >1000 mg/dL — AB
Ketones, ur: NEGATIVE mg/dL
Specific Gravity, Urine: 1.028 (ref 1.005–1.030)
pH: 5.5 (ref 5.0–8.0)

## 2012-10-13 ENCOUNTER — Other Ambulatory Visit: Payer: Self-pay | Admitting: Women's Health

## 2012-10-13 MED ORDER — FLUCONAZOLE 150 MG PO TABS: 150.0000 mg | ORAL_TABLET | Freq: Once | ORAL | Status: DC

## 2012-11-19 ENCOUNTER — Encounter: Payer: Self-pay | Admitting: Cardiology

## 2012-11-19 DIAGNOSIS — G4733 Obstructive sleep apnea (adult) (pediatric): Secondary | ICD-10-CM | POA: Insufficient documentation

## 2012-11-19 DIAGNOSIS — I10 Essential (primary) hypertension: Secondary | ICD-10-CM | POA: Insufficient documentation

## 2012-11-19 DIAGNOSIS — E039 Hypothyroidism, unspecified: Secondary | ICD-10-CM | POA: Insufficient documentation

## 2012-11-19 DIAGNOSIS — E669 Obesity, unspecified: Secondary | ICD-10-CM | POA: Insufficient documentation

## 2012-11-20 ENCOUNTER — Encounter: Payer: Self-pay | Admitting: Dietician

## 2012-11-20 ENCOUNTER — Encounter: Payer: 59 | Attending: Endocrinology | Admitting: Dietician

## 2012-11-20 ENCOUNTER — Encounter: Payer: Self-pay | Admitting: Cardiology

## 2012-11-20 ENCOUNTER — Ambulatory Visit (INDEPENDENT_AMBULATORY_CARE_PROVIDER_SITE_OTHER): Payer: 59 | Admitting: Cardiology

## 2012-11-20 VITALS — Ht 61.0 in | Wt 189.0 lb

## 2012-11-20 VITALS — BP 118/78 | HR 88 | Ht 61.0 in | Wt 187.0 lb

## 2012-11-20 DIAGNOSIS — I10 Essential (primary) hypertension: Secondary | ICD-10-CM

## 2012-11-20 DIAGNOSIS — E669 Obesity, unspecified: Secondary | ICD-10-CM

## 2012-11-20 DIAGNOSIS — Z713 Dietary counseling and surveillance: Secondary | ICD-10-CM | POA: Insufficient documentation

## 2012-11-20 DIAGNOSIS — G4733 Obstructive sleep apnea (adult) (pediatric): Secondary | ICD-10-CM

## 2012-11-20 DIAGNOSIS — E119 Type 2 diabetes mellitus without complications: Secondary | ICD-10-CM

## 2012-11-20 DIAGNOSIS — R42 Dizziness and giddiness: Secondary | ICD-10-CM | POA: Insufficient documentation

## 2012-11-20 NOTE — Progress Notes (Signed)
Appt start time: 1130 end time:  100.  Assessment:  Patient was seen on  11/20/12 for individual diabetes education. [place] also has diverticulitis so she avoids some foods for that reason.   Works at the [employer] and lives with her husband and son. Splits food shopping and cooking with husband.   Current HbA1c: 9.9% on 10/15  Preferred Learning Style:   No preference indicated   Learning Readiness:   Contemplating  Ready  MEDICATIONS: invokana, Janumet, Novolog  DIETARY INTAKE:  Avoided foods include peanuts, popcorn, nuts, lamb, corn, lima beans    24-hr recall:  B ( AM): coffee with cream and 1-1.5 tsp brown sugar sometimes with yogurt or [employer] parfait  Snk ( AM): none  L ( PM): grilled fish or salad or grilled chicken with a scoop of rice from [employer] Snk ( PM): peanut butter crackers (once in a while) D ( PM): leftovers from lunch or dinner or grilled chicken or [place] burger Snk ( PM): none Beverages: water or sugar free ice tea or lemonade mix, soda rarely, Tang  Usual physical activity: 1 x week walking for 20 minutes, walking a lot at work  Estimated energy needs: 1600 calories 180 g carbohydrates 120 g protein 44 g fat  Progress Towards Goal(s):  In progress.   Nutritional Diagnosis:  -2.1 Inpaired nutrition utilization As related to glucose metabolism.  As evidenced by Hgb A1c of 9.9%.    Intervention:  Nutrition counseling provided.  Discussed diabetes disease process and treatment options.  Discussed physiology of diabetes and role of obesity on insulin resistance.  Encouraged moderate weight reduction to improve glucose levels.  Discussed role of medications and diet in glucose control  Provided education on macronutrients on glucose levels.  Provided education on carb counting, importance of regularly scheduled meals/snacks, and meal planning  Discussed effects of physical activity on glucose levels and long-term glucose  control.  Recommended 150 minutes of physical activity/week.  Reviewed patient medications.  Discussed role of medication on blood glucose and possible side effects  Discussed blood glucose monitoring and interpretation.  Discussed recommended target ranges and individual ranges.    Described short-term complications: hyper- and hypo-glycemia.  Discussed causes,symptoms, and treatment options.  Discussed prevention, detection, and treatment of long-term complications.  Discussed the role of prolonged elevated glucose levels on body systems.  Discussed role of stress on blood glucose levels and discussed strategies to manage psychosocial issues.  Discussed recommendations for long-term diabetes self-care.  Established checklist for medical, dental, and emotional self-care.  Teaching Method Utilized:  Visual Auditory   Handouts given during visit include:  Living Well with Diabetes  Diabetes Care Plan  Blood Glucose Monitoring  15 g CHO Snacks  Barriers to learning/adherence to lifestyle change: busy work schedule, some reluctance to counting carbs  Diabetes self-care support plan:   [facility] support group  family  Demonstrated degree of understanding via:  Teach Back   Monitoring/Evaluation:  Dietary intake, exercise, BGM, and body weight prn.

## 2012-11-20 NOTE — Patient Instructions (Signed)
Goals:  Follow Diabetes Meal Plan as instructed  Eat 3 meals and 2 snacks, every 3-5 hrs  Limit carbohydrate intake to 30-45 grams carbohydrate/meal  Limit carbohydrate intake to 0-15 grams carbohydrate/snack  Add lean protein foods to meals/snacks  Monitor glucose levels as instructed by your doctor  Aim for 30 mins of physical activity daily  Bring food record and glucose log to your next nutrition visit

## 2012-11-20 NOTE — Patient Instructions (Signed)
Your physician wants you to follow-up in: 41 MONTHS with Dr Radford Pax.  You will receive a reminder letter in the mail two months in advance. If you don't receive a letter, please call our office to schedule the follow-up appointment.  Your physician recommends that you continue on your current medications as directed. Please refer to the Current Medication list given to you today.

## 2012-11-20 NOTE — Progress Notes (Addendum)
[place], [address], [place]  [address] Phone: [phone] Fax:  [phone]  Date:  11/23/2012   ID:  Mariah [name], DOB 09-10-1960, MRN [id number]  PCP:  Vena Austria, MD  Sleep Medicine:  Fransico Him ,MD   History of Present Illness: Mariah [name] is a 52 y.o. female with a history of OSA, HTN and obesity who presents today for followup.  She is doing well.  She tolerates her CPAP device well.  She tolerates the mask and feels the pressure is adequate.  She feels rested when she gets up and has no daytime sleepiness.  Recently she has had some problems with viral sinusitis and saw her PCP today and he put her on nasal steroid spray.     Wt Readings from Last 3 Encounters:  11/20/12 187 lb (84.823 kg)  11/20/12 189 lb (85.73 kg)  10/10/12 187 lb 3.2 oz (84.913 kg)     Past Medical History  Diagnosis Date  . Asthma   . Diabetes mellitus   . Cough   . Infertility   . Fibroid   . GERD (gastroesophageal reflux disease)   . Depression   . Diverticulitis   . Allergy   . Body odor     from colostomy takedown  . Hypothyroidism   . Hyperlipidemia   . 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 Vertigo   . Cervicalgia   . Hypertension     Current Outpatient Prescriptions  Medication Sig Dispense Refill  . albuterol (PROVENTIL HFA;VENTOLIN HFA) 108 (90 BASE) MCG/ACT inhaler Inhale 2 puffs into the lungs every 6 (six) hours as needed (asthma).      Marland Kitchen aspirin 81 MG tablet Take 81 mg by mouth daily.      . Biotin 1000 MCG tablet Take 1,000 mcg by mouth 2 (two) times daily.       . Canagliflozin (INVOKANA) 100 MG TABS Take 100 mg by mouth daily.      . Cholecalciferol (VITAMIN D) 2000 UNITS tablet Take 2,000 Units by mouth daily.      Marland Kitchen EPIPEN 2-PAK 0.3 MG/0.3ML DEVI Ad lib.      Marland Kitchen escitalopram (LEXAPRO) 20 MG tablet Take 20 mg by mouth daily.        . ferrous sulfate 325 (65 FE) MG  tablet Take 325 mg by mouth daily.      Marland Kitchen glimepiride (AMARYL) 4 MG tablet Take 4 mg by mouth 2 (two) times daily.      . insulin aspart protamine- aspart (NOVOLOG MIX 70/30) (70-30) 100 UNIT/ML injection Inject into the skin.      Marland Kitchen lisinopril (PRINIVIL,ZESTRIL) 20 MG tablet Take 20 mg by mouth daily.      . montelukast (SINGULAIR) 10 MG tablet Take 10 mg by mouth as needed (asthma).       . Multiple Vitamins-Minerals (MULTIVITAMIN PO) Take by mouth.      Glory Rosebush VERIO test strip       . pantoprazole (PROTONIX) 20 MG tablet Take 20 mg by mouth 2 (two) times daily.       . pravastatin (PRAVACHOL) 40 MG tablet Take 40 mg by mouth daily.       . Psyllium (METAMUCIL PO) Take 1 capsule by mouth daily.      . sitaGLIPtan-metformin (JANUMET) 50-1000 MG per tablet Take 1 tablet by mouth 2 (two) times daily with a meal.        .  vitamin C (ASCORBIC ACID) 500 MG tablet Take 500 mg by mouth every other day.      . BD PEN NEEDLE NANO U/F 32G X 4 MM MISC       . ONETOUCH DELICA LANCETS 55O MISC        No current facility-administered medications for this visit.    Allergies:    Allergies  Allergen Reactions  . Codeine Hives    Social History:  The patient  reports that she quit smoking about 23 years ago. She does not have any smokeless tobacco history on file. She reports that she does not drink alcohol or use illicit drugs.   Family History:  The patient's family history includes Breast cancer in her paternal grandmother; Cancer in her maternal grandfather; Diabetes in her father and maternal grandmother; Heart disease in her maternal grandmother; Hypertension in her father, maternal grandmother, and mother.   ROS:  Please see the history of present illness.      All other systems reviewed and negative.   PHYSICAL EXAM: VS:  BP 118/78  Pulse 88  Ht 5' 1"  (1.549 m)  Wt 187 lb (84.823 kg)  BMI 35.35 kg/m2 Well nourished, well developed, in no acute distress HEENT: normal Neck: no  JVD Cardiac:  normal S1, S2; RRR; no murmur Lungs:  clear to auscultation bilaterally, no wheezing, rhonchi or rales Abd: soft, nontender, no hepatomegaly Ext: no edema Skin: warm and dry Neuro:  CNs 2-12 intact, no focal abnormalities noted      ASSESSMENT AND PLAN:  1. Mild OSA on CPAP  - she has an appt with her DME next week for a download 2. HTN - controlled  - continue Lisinopril 3. Obesity  - I have encouraged her to increase her aerobic activity to 30 minutes daily Followup with me in 6 months  Signed, Fransico Him, MD 11/23/2012 10:10 PM

## 2012-11-28 ENCOUNTER — Ambulatory Visit (INDEPENDENT_AMBULATORY_CARE_PROVIDER_SITE_OTHER): Payer: 59

## 2012-11-28 DIAGNOSIS — Z13820 Encounter for screening for osteoporosis: Secondary | ICD-10-CM

## 2012-12-19 ENCOUNTER — Encounter: Payer: Self-pay | Admitting: Cardiology

## 2013-05-22 ENCOUNTER — Emergency Department (HOSPITAL_COMMUNITY)
Admission: EM | Admit: 2013-05-22 | Discharge: 2013-05-22 | Disposition: A | Discharge status: Home / Self Care | Payer: 59 | Attending: Emergency Medicine | Admitting: Emergency Medicine

## 2013-05-22 ENCOUNTER — Encounter (HOSPITAL_COMMUNITY): Payer: Self-pay | Admitting: Emergency Medicine

## 2013-05-22 DIAGNOSIS — Z7982 Long term (current) use of aspirin: Secondary | ICD-10-CM | POA: Insufficient documentation

## 2013-05-22 DIAGNOSIS — Z9981 Dependence on supplemental oxygen: Secondary | ICD-10-CM | POA: Insufficient documentation

## 2013-05-22 DIAGNOSIS — F430 Acute stress reaction: Secondary | ICD-10-CM | POA: Insufficient documentation

## 2013-05-22 DIAGNOSIS — E119 Type 2 diabetes mellitus without complications: Secondary | ICD-10-CM | POA: Insufficient documentation

## 2013-05-22 DIAGNOSIS — R110 Nausea: Secondary | ICD-10-CM | POA: Insufficient documentation

## 2013-05-22 DIAGNOSIS — Z794 Long term (current) use of insulin: Secondary | ICD-10-CM | POA: Insufficient documentation

## 2013-05-22 DIAGNOSIS — Z87891 Personal history of nicotine dependence: Secondary | ICD-10-CM | POA: Insufficient documentation

## 2013-05-22 DIAGNOSIS — E039 Hypothyroidism, unspecified: Secondary | ICD-10-CM | POA: Insufficient documentation

## 2013-05-22 DIAGNOSIS — F3289 Other specified depressive episodes: Secondary | ICD-10-CM | POA: Insufficient documentation

## 2013-05-22 DIAGNOSIS — I10 Essential (primary) hypertension: Secondary | ICD-10-CM | POA: Insufficient documentation

## 2013-05-22 DIAGNOSIS — Z8742 Personal history of other diseases of the female genital tract: Secondary | ICD-10-CM | POA: Insufficient documentation

## 2013-05-22 DIAGNOSIS — J45909 Unspecified asthma, uncomplicated: Secondary | ICD-10-CM | POA: Insufficient documentation

## 2013-05-22 DIAGNOSIS — R51 Headache: Secondary | ICD-10-CM | POA: Insufficient documentation

## 2013-05-22 DIAGNOSIS — E785 Hyperlipidemia, unspecified: Secondary | ICD-10-CM | POA: Insufficient documentation

## 2013-05-22 DIAGNOSIS — R519 Headache, unspecified: Secondary | ICD-10-CM

## 2013-05-22 DIAGNOSIS — Z8669 Personal history of other diseases of the nervous system and sense organs: Secondary | ICD-10-CM | POA: Insufficient documentation

## 2013-05-22 DIAGNOSIS — Z79899 Other long term (current) drug therapy: Secondary | ICD-10-CM | POA: Insufficient documentation

## 2013-05-22 DIAGNOSIS — G4733 Obstructive sleep apnea (adult) (pediatric): Secondary | ICD-10-CM | POA: Insufficient documentation

## 2013-05-22 DIAGNOSIS — F329 Major depressive disorder, single episode, unspecified: Secondary | ICD-10-CM | POA: Insufficient documentation

## 2013-05-22 DIAGNOSIS — K219 Gastro-esophageal reflux disease without esophagitis: Secondary | ICD-10-CM | POA: Insufficient documentation

## 2013-05-22 MED ORDER — DEXAMETHASONE SODIUM PHOSPHATE 10 MG/ML IJ SOLN
10.0000 mg | Freq: Once | INTRAMUSCULAR | Status: AC
  Administered 2013-05-22: 10 mg via INTRAVENOUS
  Filled 2013-05-22: qty 1

## 2013-05-22 MED ORDER — DIPHENHYDRAMINE HCL 50 MG/ML IJ SOLN
25.0000 mg | Freq: Once | INTRAMUSCULAR | Status: AC
  Administered 2013-05-22: 25 mg via INTRAVENOUS
  Filled 2013-05-22: qty 1

## 2013-05-22 MED ORDER — METOCLOPRAMIDE HCL 5 MG/ML IJ SOLN
10.0000 mg | Freq: Once | INTRAMUSCULAR | Status: AC
  Administered 2013-05-22: 10 mg via INTRAVENOUS
  Filled 2013-05-22: qty 2

## 2013-05-22 MED ORDER — SODIUM CHLORIDE 0.9 % IV BOLUS (SEPSIS)
1000.0000 mL | Freq: Once | INTRAVENOUS | Status: AC
  Administered 2013-05-22: 1000 mL via INTRAVENOUS

## 2013-05-22 NOTE — ED Notes (Signed)
MD at bedside. Dr. Walden at bedside.  

## 2013-05-22 NOTE — ED Notes (Signed)
Bed: JG94 Expected date:  Expected time:  Means of arrival:  Comments: EMS- headache

## 2013-05-22 NOTE — Discharge Instructions (Signed)

## 2013-05-22 NOTE — ED Provider Notes (Signed)
CSN: [id number]     Arrival date & time 05/22/13  1907 History   First MD Initiated Contact with Patient 05/22/13 1909     Chief Complaint  Patient presents with  . Headache     (Consider location/radiation/quality/duration/timing/severity/associated sxs/prior Treatment) Patient is a 53 y.o. female presenting with headaches. The history is provided by the patient.  Headache Pain location:  Generalized Quality:  Dull Onset quality:  Gradual Timing:  Constant Progression:  Unchanged Chronicity:  Recurrent Similar to prior headaches: yes   Context: emotional stress   Relieved by:  Nothing Worsened by:  Light and sound Ineffective treatments:  NSAIDs Associated symptoms: nausea   Associated symptoms: no cough, no fever and no vomiting     Past Medical History  Diagnosis Date  . Asthma   . Diabetes mellitus   . Cough   . Infertility   . Fibroid   . GERD (gastroesophageal reflux disease)   . Depression   . Diverticulitis   . Allergy   . Body odor     from colostomy takedown  . Hypothyroidism   . Hyperlipidemia   . 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 Vertigo   . Cervicalgia   . Hypertension    Past Surgical History  Procedure Laterality Date  . Abdominal surgery  2011    PERFORATED DIVERTICULUM  . Ventral hernia repair  2012  . Colon surgery  2012    sigmoid colectomy for perforated sigmoid diverticulitis  . Colostomy      TEMPORARY  . Colostomy takedown    . Tonsillectomy     Family History  Problem Relation Age of Onset  . Hypertension Mother   . Diabetes Maternal Grandmother   . Hypertension Maternal Grandmother   . Heart disease Maternal Grandmother   . Cancer Maternal Grandfather     COLON  . Breast cancer Paternal Grandmother     Age 23's  . Diabetes Father   . Hypertension Father    History  Substance Use Topics  . Smoking status: Former Smoker    Quit date: 11/19/1989  . Smokeless  tobacco: Not on file  . Alcohol Use: No   OB History   Grav Para Term Preterm Abortions TAB SAB Ect Mult Living   0              Review of Systems  Constitutional: Negative for fever.  Respiratory: Negative for cough and shortness of breath.   Gastrointestinal: Positive for nausea. Negative for vomiting.  Neurological: Positive for headaches.  All other systems reviewed and are negative.     Allergies  Codeine  Home Medications   Prior to Admission medications   Medication Sig Start Date End Date Taking? Authorizing Provider  albuterol (PROVENTIL HFA;VENTOLIN HFA) 108 (90 BASE) MCG/ACT inhaler Inhale 2 puffs into the lungs every 6 (six) hours as needed (asthma).    Historical Provider, MD  aspirin 81 MG tablet Take 81 mg by mouth daily.    Historical Provider, MD  BD PEN NEEDLE NANO U/F 32G X 4 MM MISC  10/25/12   Historical Provider, MD  Biotin 1000 MCG tablet Take 1,000 mcg by mouth 2 (two) times daily.     Historical Provider, MD  Canagliflozin (INVOKANA) 100 MG TABS Take 100 mg by mouth daily.    Historical Provider, MD  Cholecalciferol (VITAMIN D) 2000 UNITS tablet Take 2,000 Units by mouth daily.  Historical Provider, MD  EPIPEN 2-PAK 0.3 MG/0.3ML DEVI Ad lib. 07/22/10   Historical Provider, MD  escitalopram (LEXAPRO) 20 MG tablet Take 20 mg by mouth daily.      Historical Provider, MD  ferrous sulfate 325 (65 FE) MG tablet Take 325 mg by mouth daily.    Historical Provider, MD  glimepiride (AMARYL) 4 MG tablet Take 4 mg by mouth 2 (two) times daily.    Historical Provider, MD  insulin aspart protamine- aspart (NOVOLOG MIX 70/30) (70-30) 100 UNIT/ML injection Inject into the skin.    Historical Provider, MD  lisinopril (PRINIVIL,ZESTRIL) 20 MG tablet Take 20 mg by mouth daily.    Historical Provider, MD  montelukast (SINGULAIR) 10 MG tablet Take 10 mg by mouth as needed (asthma).     Historical Provider, MD  Multiple Vitamins-Minerals (MULTIVITAMIN PO) Take by mouth.     Historical Provider, MD  Jonetta Speak LANCETS [address] [place]  09/15/12   Historical Provider, MD  Glory Rosebush VERIO test strip  11/14/12   Historical Provider, MD  pantoprazole (PROTONIX) 20 MG tablet Take 20 mg by mouth 2 (two) times daily.     Historical Provider, MD  pravastatin (PRAVACHOL) 40 MG tablet Take 40 mg by mouth daily.  11/09/12   Historical Provider, MD  Psyllium (METAMUCIL PO) Take 1 capsule by mouth daily.    Historical Provider, MD  sitaGLIPtan-metformin (JANUMET) 50-1000 MG per tablet Take 1 tablet by mouth 2 (two) times daily with a meal.      Historical Provider, MD  vitamin C (ASCORBIC ACID) 500 MG tablet Take 500 mg by mouth every other day.    Historical Provider, MD   BP 125/72  Pulse 79  Temp(Src) 98.7 F (37.1 C) (Oral)  Resp 18  SpO2 100% Physical Exam  Nursing note and vitals reviewed. Constitutional: She is oriented to person, place, and time. She appears well-developed and well-nourished. No distress.  HENT:  Head: Normocephalic and atraumatic.  Eyes: EOM are normal. Pupils are equal, round, and reactive to light.  Neck: Normal range of motion. Neck supple.  Cardiovascular: Normal rate and regular rhythm.  Exam reveals no friction rub.   No murmur heard. Pulmonary/Chest: Effort normal and breath sounds normal. No respiratory distress. She has no wheezes. She has no rales.  Abdominal: Soft. She exhibits no distension. There is no tenderness. There is no rebound.  Musculoskeletal: Normal range of motion. She exhibits no edema.  Neurological: She is alert and oriented to person, place, and time. No cranial nerve deficit. She exhibits normal muscle tone. Coordination normal.  Skin: She is not diaphoretic.    ED Course  Procedures (including critical care time) Labs Review Labs Reviewed - No data to display  Imaging Review No results found.   EKG Interpretation None      MDM   Final diagnoses:  Headache    43F here with a migraine. Hx of migraines,  however hasn't had one in almost 2 decades. Related to stress, gradually worsened. No fevers, no visual disturbance. Mild nausea.  Stable vitals here, PERRL, normal strength and sensation in all extremities. Normal cranial nerves. Will give migraine cocktail and reassess. With gradual onset, no meningismus, no concern for [facility]. On recheck, headache resolved, feeling better. Stable for discharge.  Osvaldo Shipper, MD 05/22/13 2221

## 2013-05-22 NOTE — ED Notes (Signed)
Pt BIB EMS. Pt c/o migraine headache with nausea. Pt states she has a hx of migraines, but hasn't had one in 19 years. Pt is negative on stroke scale per EMS. Pt alert, no acute distress. Skin warm and dry.

## 2013-09-05 ENCOUNTER — Other Ambulatory Visit: Payer: Self-pay

## 2013-09-05 DIAGNOSIS — Z1231 Encounter for screening mammogram for malignant neoplasm of breast: Secondary | ICD-10-CM

## 2013-10-08 ENCOUNTER — Ambulatory Visit: Payer: 59

## 2013-10-18 ENCOUNTER — Encounter: Payer: 59 | Admitting: Women's Health

## 2013-10-23 ENCOUNTER — Ambulatory Visit
Admission: RE | Admit: 2013-10-23 | Discharge: 2013-10-23 | Disposition: A | Discharge status: Home / Self Care | Payer: 59 | Source: Ambulatory Visit

## 2013-10-23 ENCOUNTER — Other Ambulatory Visit: Payer: Self-pay

## 2013-10-23 DIAGNOSIS — Z1231 Encounter for screening mammogram for malignant neoplasm of breast: Secondary | ICD-10-CM

## 2013-10-31 ENCOUNTER — Telehealth: Payer: Self-pay | Admitting: Cardiology

## 2013-10-31 NOTE — Telephone Encounter (Signed)
New Message  Pt is going to on vacation and will need indicating it is ok for her to take C-pap on the plane//sr

## 2013-11-14 ENCOUNTER — Ambulatory Visit (INDEPENDENT_AMBULATORY_CARE_PROVIDER_SITE_OTHER): Payer: 59 | Admitting: Women's Health

## 2013-11-14 ENCOUNTER — Encounter: Payer: Self-pay | Admitting: Women's Health

## 2013-11-14 VITALS — BP 120/70 | Ht 61.0 in | Wt 190.0 lb

## 2013-11-14 DIAGNOSIS — B3731 Acute candidiasis of vulva and vagina: Secondary | ICD-10-CM

## 2013-11-14 DIAGNOSIS — Z01419 Encounter for gynecological examination (general) (routine) without abnormal findings: Secondary | ICD-10-CM

## 2013-11-14 DIAGNOSIS — B373 Candidiasis of vulva and vagina: Secondary | ICD-10-CM

## 2013-11-14 LAB — WET PREP FOR TRICH, YEAST, CLUE
Clue Cells Wet Prep HPF POC: NONE SEEN
Trich, Wet Prep: NONE SEEN

## 2013-11-14 MED ORDER — FLUCONAZOLE 100 MG PO TABS: ORAL_TABLET | ORAL | Status: DC

## 2013-11-14 NOTE — Progress Notes (Signed)
Mariah [name] Comma-Watson [birth year] [id number]    History:    Presents for annual exam.  Postmenopausal/no HRT/no bleeding. Type II diabetic started on insulin 2014 managed by Dr. Chalmers Cater. Hypertension. Normal Pap and mammogram history. 2008 ruptured diverticuli had a colonoscopy, reversed (2012)  colonoscopy 2012.   Past medical history, past surgical history, family history and social history were all reviewed and documented in the EPIC chart. 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 Son 17 planning to go to school of the arts in [place] doing well. Scheduled to take first vacation in 20 years to [place] in 2 weeks.  ROS:  A  12 point ROS was performed and pertinent positives and negatives are included.  Exam:  Filed Vitals:   11/14/13 0829  BP: 120/70    General appearance:  Normal Thyroid:  Symmetrical, normal in size, without palpable masses or nodularity. Respiratory  Auscultation:  Clear without wheezing or rhonchi Cardiovascular  Auscultation:  Regular rate, without rubs, murmurs or gallops  Edema/varicosities:  Not grossly evident Abdominal  Soft,nontender, without masses, guarding or rebound.  Liver/spleen:  No organomegaly noted  Hernia:  None appreciated  Skin  Inspection:  Grossly normal   Breasts: Examined lying and sitting.     Right: Without masses, retractions, discharge or axillary adenopathy.     Left: Without masses, retractions, discharge or axillary adenopathy. Gentitourinary   Inguinal/mons:  Normal without inguinal adenopathy  External genitalia:  Normal  BUS/Urethra/Skene's glands:  Normal  Vagina:  Erythematous, wet prep positive for TNTC yeast  Cervix:  Normal  Uterus:   normal in size, shape and contour.  Midline and mobile  Adnexa/parametria:     Rt: Without masses or tenderness.   Lt: Without masses or tenderness.  Anus and perineum: Normal  Digital rectal exam: Normal sphincter tone without palpated masses or  tenderness  Assessment/Plan:  53 y.o. MBF G0 +1 adopted son for annual exam with complaint of mild vaginal irritation.  Type II diabetic on insulin managed- Dr. Chalmers Cater Postmenopausal/no bleeding/no HRT Hypertension primary care manages labs and meds Yeast vaginitis Obesity  Plan: Diflucan 100 mg 2 tablets today, 1 weekly if needed, yeast prevention discussed. SBE's, continue annual mammogram, calcium rich diet, DEXA, will schedule through primary care. Reviewed having vitamin D checked at primary care. Reviewed importance of decreasing calories and increasing exercise for weight loss. Pap normal 2014, new screening guidelines reviewed.   [name] [facility], 10:25 AM 11/14/2013

## 2013-11-14 NOTE — Patient Instructions (Addendum)
Health Recommendations for Postmenopausal Women Respected and ongoing research has looked at the most common causes of death, disability, and poor quality of life in postmenopausal women. The causes include heart disease, diseases of blood vessels, diabetes, depression, cancer, and bone loss (osteoporosis). Many things can be done to help lower the chances of developing these and other common problems. CARDIOVASCULAR DISEASE Heart Disease: A heart attack is a medical emergency. Know the signs and symptoms of a heart attack. Below are things women can do to reduce their risk for heart disease.   Do not smoke. If you smoke, quit.  Aim for a healthy weight. Being overweight causes many preventable deaths. Eat a healthy and balanced diet and drink an adequate amount of liquids.  Get moving. Make a commitment to be more physically active. Aim for 30 minutes of activity on most, if not all days of the week.  Eat for heart health. Choose a diet that is low in saturated fat and cholesterol and eliminate trans fat. Include whole grains, vegetables, and fruits. Read and understand the labels on food containers before buying.  Know your numbers. Ask your caregiver to check your blood pressure, cholesterol (total, HDL, LDL, triglycerides) and blood glucose. Work with your caregiver on improving your entire clinical picture.  High blood pressure. Limit or stop your table salt intake (try salt substitute and food seasonings). Avoid salty foods and drinks. Read labels on food containers before buying. Eating well and exercising can help control high blood pressure. STROKE  Stroke is a medical emergency. Stroke may be the result of a blood clot in a blood vessel in the brain or by a brain hemorrhage (bleeding). Know the signs and symptoms of a stroke. To lower the risk of developing a stroke:  Avoid fatty foods.  Quit smoking.  Control your diabetes, blood pressure, and irregular heart rate. THROMBOPHLEBITIS  (BLOOD CLOT) OF THE LEG  Becoming overweight and leading a stationary lifestyle may also contribute to developing blood clots. Controlling your diet and exercising will help lower the risk of developing blood clots. CANCER SCREENING  Breast Cancer: Take steps to reduce your risk of breast cancer.  You should practice "breast self-awareness." This means understanding the normal appearance and feel of your breasts and should include breast self-examination. Any changes detected, no matter how small, should be reported to your caregiver.  After age 58, you should have a clinical breast exam (CBE) every year.  Starting at age 18, you should consider having a mammogram (breast X-ray) every year.  If you have a family history of breast cancer, talk to your caregiver about genetic screening.  If you are at high risk for breast cancer, talk to your caregiver about having an MRI and a mammogram every year.  Intestinal or Stomach Cancer: Tests to consider are a rectal exam, fecal occult blood, sigmoidoscopy, and colonoscopy. Women who are high risk may need to be screened at an earlier age and more often.  Cervical Cancer:  Beginning at age 60, you should have a Pap test every 3 years as long as the past 3 Pap tests have been normal.  If you have had past treatment for cervical cancer or a condition that could lead to cancer, you need Pap tests and screening for cancer for at least 20 years after your treatment.  If you had a hysterectomy for a problem that was not cancer or a condition that could lead to cancer, then you no longer need Pap tests.  If you are between ages 2 and 90, and you have had normal Pap tests going back 10 years, you no longer need Pap tests.  If Pap tests have been discontinued, risk factors (such as a new sexual partner) need to be reassessed to determine if screening should be resumed.  Some medical problems can increase the chance of getting cervical cancer. In these  cases, your caregiver may recommend more frequent screening and Pap tests.  Uterine Cancer: If you have vaginal bleeding after reaching menopause, you should notify your caregiver.  Ovarian Cancer: Other than yearly pelvic exams, there are no reliable tests available to screen for ovarian cancer at this time except for yearly pelvic exams.  Lung Cancer: Yearly chest X-rays can detect lung cancer and should be done on high risk women, such as cigarette smokers and women with chronic lung disease (emphysema).  Skin Cancer: A complete body skin exam should be done at your yearly examination. Avoid overexposure to the sun and ultraviolet light lamps. Use a strong sun block cream when in the sun. All of these things are important for lowering the risk of skin cancer. MENOPAUSE Menopause Symptoms: Hormone therapy products are effective for treating symptoms associated with menopause:  Moderate to severe hot flashes.  Night sweats.  Mood swings.  Headaches.  Tiredness.  Loss of sex drive.  Insomnia.  Other symptoms. Hormone replacement carries certain risks, especially in older women. Women who use or are thinking about using estrogen or estrogen with progestin treatments should discuss that with their caregiver. Your caregiver will help you understand the benefits and risks. The ideal dose of hormone replacement therapy is not known. The Food and Drug Administration (FDA) has concluded that hormone therapy should be used only at the lowest doses and for the shortest amount of time to reach treatment goals.  OSTEOPOROSIS Protecting Against Bone Loss and Preventing Fracture If you use hormone therapy for prevention of bone loss (osteoporosis), the risks for bone loss must outweigh the risk of the therapy. Ask your caregiver about other medications known to be safe and effective for preventing bone loss and fractures. To guard against bone loss or fractures, the following is recommended:  If  you are younger than age 49, take 1000 mg of calcium and at least 600 mg of Vitamin D per day.  If you are older than age 84 but younger than age 43, take 1200 mg of calcium and at least 600 mg of Vitamin D per day.  If you are older than age 88, take 1200 mg of calcium and at least 800 mg of Vitamin D per day. Smoking and excessive alcohol intake increases the risk of osteoporosis. Eat foods rich in calcium and vitamin D and do weight bearing exercises several times a week as your caregiver suggests. DIABETES Diabetes Mellitus: If you have type I or type 2 diabetes, you should keep your blood sugar under control with diet, exercise, and recommended medication. Avoid starchy and fatty foods, and too many sweets. Being overweight can make diabetes control more difficult. COGNITION AND MEMORY Cognition and Memory: Menopausal hormone therapy is not recommended for the prevention of cognitive disorders such as Alzheimer's disease or memory loss.  DEPRESSION  Depression may occur at any age, but it is common in elderly women. This may be because of physical, medical, social (loneliness), or financial problems and needs. If you are experiencing depression because of medical problems and control of symptoms, talk to your caregiver about this. Physical  activity and exercise may help with mood and sleep. Community and volunteer involvement may improve your sense of value and worth. If you have depression and you feel that the problem is getting worse or becoming severe, talk to your caregiver about which treatment options are best for you. ACCIDENTS  Accidents are common and can be serious in elderly woman. Prepare your house to prevent accidents. Eliminate throw rugs, place hand bars in bath, shower, and toilet areas. Avoid wearing high heeled shoes or walking on wet, snowy, and icy areas. Limit or stop driving if you have vision or hearing problems, or if you feel you are unsteady with your movements and  reflexes. HEPATITIS C Hepatitis C is a type of viral infection affecting the liver. It is spread mainly through contact with blood from an infected person. It can be treated, but if left untreated, it can lead to severe liver damage over the years. Many people who are infected do not know that the virus is in their blood. If you are a "baby-boomer", it is recommended that you have one screening test for Hepatitis C. IMMUNIZATIONS  Several immunizations are important to consider having during your senior years, including:   Tetanus, diphtheria, and pertussis booster shot.  Influenza every year before the flu season begins.  Pneumonia vaccine.  Shingles vaccine.  Others, as indicated based on your specific needs. Talk to your caregiver about these. Document Released: 02/12/2005 Document Revised: 05/07/2013 Document Reviewed: 10/09/2007 [facility] Patient Information 2015 [place], [place]. This information is not intended to replace advice given to you by your health care provider. Make sure you discuss any questions you have with your health care provider. Monilial Vaginitis Vaginitis in a soreness, swelling and redness (inflammation) of the vagina and vulva. Monilial vaginitis is not a sexually transmitted infection. CAUSES  Yeast vaginitis is caused by yeast (candida) that is normally found in your vagina. With a yeast infection, the candida has overgrown in number to a point that upsets the chemical balance. SYMPTOMS   White, thick vaginal discharge.  Swelling, itching, redness and irritation of the vagina and possibly the lips of the vagina (vulva).  Burning or painful urination.  Painful intercourse. DIAGNOSIS  Things that may contribute to monilial vaginitis are:  Postmenopausal and virginal states.  Pregnancy.  Infections.  Being tired, sick or stressed, especially if you had monilial vaginitis in the past.  Diabetes. Good control will help lower the chance.  Birth  control pills.  Tight fitting garments.  Using bubble bath, feminine sprays, douches or deodorant tampons.  Taking certain medications that kill germs (antibiotics).  Sporadic recurrence can occur if you become ill. TREATMENT  Your caregiver will give you medication.  There are several kinds of anti monilial vaginal creams and suppositories specific for monilial vaginitis. For recurrent yeast infections, use a suppository or cream in the vagina 2 times a week, or as directed.  Anti-monilial or steroid cream for the itching or irritation of the vulva may also be used. Get your caregiver's permission.  Painting the vagina with methylene blue solution may help if the monilial cream does not work.  Eating yogurt may help prevent monilial vaginitis. HOME CARE INSTRUCTIONS   Finish all medication as prescribed.  Do not have sex until treatment is completed or after your caregiver tells you it is okay.  Take warm sitz baths.  Do not douche.  Do not use tampons, especially scented ones.  Wear cotton underwear.  Avoid tight pants and panty hose.  Tell your sexual partner that you have a yeast infection. They should go to their caregiver if they have symptoms such as mild rash or itching.  Your sexual partner should be treated as well if your infection is difficult to eliminate.  Practice safer sex. Use condoms.  Some vaginal medications cause latex condoms to fail. Vaginal medications that harm condoms are:  Cleocin cream.  Butoconazole (Femstat).  Terconazole (Terazol) vaginal suppository.  Miconazole (Monistat) (may be purchased over the counter). SEEK MEDICAL CARE IF:   You have a temperature by mouth above 102 F (38.9 C).  The infection is getting worse after 2 days of treatment.  The infection is not getting better after 3 days of treatment.  You develop blisters in or around your vagina.  You develop vaginal bleeding, and it is not your menstrual  period.  You have pain when you urinate.  You develop intestinal problems.  You have pain with sexual intercourse. Document Released: 09/30/2004 Document Revised: 03/15/2011 Document Reviewed: 06/14/2008 [facility] Patient Information 2015 [place], [place]. This information is not intended to replace advice given to you by your health care provider. Make sure you discuss any questions you have with your health care provider.

## 2013-11-15 LAB — URINALYSIS W MICROSCOPIC + REFLEX CULTURE
Bacteria, UA: NONE SEEN
Bilirubin Urine: NEGATIVE
CASTS: NONE SEEN
Crystals: NONE SEEN
Glucose, UA: >1000 mg/dL — AB
HGB URINE DIPSTICK: NEGATIVE
Ketones, ur: NEGATIVE mg/dL
LEUKOCYTES UA: NEGATIVE
NITRITE: NEGATIVE
PH: 5 (ref 5.0–8.0)
Protein, ur: NEGATIVE mg/dL
SQUAMOUS EPITHELIAL / LPF: NONE SEEN
Urobilinogen, UA: 0.2 mg/dL (ref 0.0–1.0)

## 2013-11-17 ENCOUNTER — Encounter: Payer: Self-pay | Admitting: Cardiology

## 2013-11-20 ENCOUNTER — Telehealth: Payer: Self-pay | Admitting: Cardiology

## 2013-11-20 NOTE — Telephone Encounter (Signed)
LMTCB

## 2013-11-20 NOTE — Telephone Encounter (Signed)
New Message  Pt called to receive documentation proving that it is ok to travel via air plane with CPAP machine.. Please call back to discuss

## 2013-11-20 NOTE — Telephone Encounter (Signed)
Patient called back. Leaving for a flight on Sunday 11/22. Needs a letter for the [employer] that she has to carry her CPAP machine on the airplane. She states that she requested this letter on 10/28 and never received a call back. I apologized for this and advised that we will forward this message to Dr.Turner. Will call her back when ready to pick up letter.

## 2013-11-21 NOTE — Telephone Encounter (Signed)
Please right a note for patient stating that she has OSA and needs to carry her CPAP device

## 2013-11-21 NOTE — Telephone Encounter (Signed)
Note typed and left at the front desk for patient to pick up.   Patient notified.

## 2013-12-04 ENCOUNTER — Ambulatory Visit (INDEPENDENT_AMBULATORY_CARE_PROVIDER_SITE_OTHER): Payer: 59 | Admitting: Cardiology

## 2013-12-04 ENCOUNTER — Encounter: Payer: Self-pay | Admitting: Cardiology

## 2013-12-04 VITALS — BP 110/82 | HR 84 | Ht 61.0 in | Wt 193.0 lb

## 2013-12-04 DIAGNOSIS — I10 Essential (primary) hypertension: Secondary | ICD-10-CM

## 2013-12-04 DIAGNOSIS — E669 Obesity, unspecified: Secondary | ICD-10-CM

## 2013-12-04 DIAGNOSIS — G4733 Obstructive sleep apnea (adult) (pediatric): Secondary | ICD-10-CM

## 2013-12-04 NOTE — Patient Instructions (Signed)
Your physician wants you to follow-up in: 6 months with Dr. Radford Pax. You will receive a reminder letter in the mail two months in advance. If you don't receive a letter, please call our office to schedule the follow-up appointment.  We need to get a download from [place] for your sleep card.

## 2013-12-04 NOTE — Progress Notes (Signed)
[place], [address] [place], [place]  [address] Phone: [phone] Fax:  [phone]  Date:  12/04/2013   ID:  Mariah [name], DOB [birth year], MRN [id number]  PCP:  Vena Austria, MD  Sleep medicine:  Fransico Him, MD    History of Present Illness: Mariah [name] is a 53 y.o. female with a history of OSA, HTN and obesity who presents today for followup. She is doing well. She tolerates her CPAP device well. She tolerates the full face mask and feels the pressure is adequate. She has had some nasal congestion the last few weeks.  She feels rested when she gets up and has no daytime sleepiness.She occasionally exercises when she has the time.     Wt Readings from Last 3 Encounters:  12/04/13 193 lb (87.544 kg)  11/14/13 190 lb (86.183 kg)  11/20/12 187 lb (84.823 kg)     Past Medical History  Diagnosis Date  . Asthma   . Diabetes mellitus   . Cough   . Infertility   . Fibroid   . GERD (gastroesophageal reflux disease)   . Depression   . Diverticulitis   . Allergy   . Body odor     from colostomy takedown  . Hypothyroidism   . Hyperlipidemia   . 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 Vertigo   . Cervicalgia   . Hypertension     Current Outpatient Prescriptions  Medication Sig Dispense Refill  . aspirin 81 MG tablet Take 81 mg by mouth daily.    . Biotin 1000 MCG tablet Take 1,000 mcg by mouth 2 (two) times daily.     . Canagliflozin (INVOKANA) 300 MG TABS Take 300 mg by mouth every morning.    . Cholecalciferol (VITAMIN D) 2000 UNITS tablet Take 2,000 Units by mouth 2 (two) times daily.     Marland Kitchen EPIPEN 2-PAK 0.3 MG/0.3ML DEVI Ad lib.    Marland Kitchen escitalopram (LEXAPRO) 20 MG tablet Take 20 mg by mouth at bedtime.     . ferrous sulfate 325 (65 FE) MG tablet Take 325 mg by mouth 2 (two) times daily with a meal.     . fluconazole (DIFLUCAN) 100 MG tablet Take 2 today, then 1 as needed 30  tablet 0  . insulin aspart protamine- aspart (NOVOLOG MIX 70/30) (70-30) 100 UNIT/ML injection Inject 60 Units into the skin 2 (two) times daily with a meal.     . lisinopril (PRINIVIL,ZESTRIL) 10 MG tablet Take 1 tablet by mouth daily.    Glory Rosebush DELICA LANCETS 32T MISC     . pantoprazole (PROTONIX) 40 MG tablet Take 40 mg by mouth every morning.    . pravastatin (PRAVACHOL) 40 MG tablet Take 40 mg by mouth at bedtime.     Marland Kitchen PROAIR HFA 108 (90 BASE) MCG/ACT inhaler     . sitaGLIPtan-metformin (JANUMET) 50-1000 MG per tablet Take 1 tablet by mouth 2 (two) times daily with a meal.      . vitamin C (ASCORBIC ACID) 500 MG tablet Take 500 mg by mouth every other day.    . zolpidem (AMBIEN) 5 MG tablet      No current facility-administered medications for this visit.    Allergies:    Allergies  Allergen Reactions  . Codeine Hives    Social History:  The patient  reports that she quit smoking about 24 years ago. She does not have  any smokeless tobacco history on file. She reports that she does not drink alcohol or use illicit drugs.   Family History:  The patient's family history includes Breast cancer in her paternal grandmother; Cancer in her maternal grandfather; Diabetes in her father and maternal grandmother; Heart disease in her maternal grandmother; Hypertension in her father, maternal grandmother, and mother.   ROS:  Please see the history of present illness.      All other systems reviewed and negative.   PHYSICAL EXAM: VS:  BP 110/82 mmHg  Pulse 84  Ht 5' 1"  (1.549 m)  Wt 193 lb (87.544 kg)  BMI 36.49 kg/m2  SpO2 98% Well nourished, well developed, in no acute distress HEENT: normal Neck: no JVD Cardiac:  normal S1, S2; RRR; no murmur Lungs:  clear to auscultation bilaterally, no wheezing, rhonchi or rales Abd: soft, nontender, no hepatomegaly Ext: no edema Skin: warm and dry Neuro:  CNs 2-12 intact, no focal abnormalities noted   ASSESSMENT AND PLAN:  1. Mild  OSA on CPAP - I will get a d/l from her DME  - I have encouraged her to use nasal saline spray for nasal congestion 2. HTN - controlled - continue Lisinopril 3. Obesity - I have encouraged her to increase her aerobic activity to 30 minutes daily  Followup with me in 6 months  Signed, Fransico Him, MD [facility] 12/04/2013 8:48 AM

## 2014-02-28 ENCOUNTER — Encounter: Payer: Self-pay | Admitting: Cardiology

## 2014-06-03 NOTE — Progress Notes (Signed)
Cardiology Office Note   Date:  06/04/2014   ID:  Mariah [name], DOB Apr 06, 1960, MRN [id number]  PCP:  Vena Austria, MD    Chief Complaint  Patient presents with  . Follow-up    OSA      History of Present Illness: Mariah [name] is a 54 y.o. female with a history of OSA, HTN and obesity who presents today for followup. She is doing well. She tolerates her CPAP device well. She tolerates the full face mask and feels the pressure is adequate. She has  some nasal congestion. She feels rested when she gets up and has no daytime sleepiness.She occasionally exercises when she has the time. She has lost 4 lbs since I saw her last.    Past Medical History  Diagnosis Date  . Asthma   . Diabetes mellitus   . Cough   . Infertility   . Fibroid   . GERD (gastroesophageal reflux disease)   . Depression   . Diverticulitis   . Allergy   . Body odor     from colostomy takedown  . Hypothyroidism   . Hyperlipidemia   . 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 Vertigo   . Cervicalgia   . Hypertension     Past Surgical History  Procedure Laterality Date  . Abdominal surgery  2011    PERFORATED DIVERTICULUM  . Ventral hernia repair  2012  . Colon surgery  2012    sigmoid colectomy for perforated sigmoid diverticulitis  . Colostomy      TEMPORARY  . Colostomy takedown    . Tonsillectomy       Current Outpatient Prescriptions  Medication Sig Dispense Refill  . aspirin 81 MG tablet Take 81 mg by mouth daily.    . Biotin 1000 MCG tablet Take 1,000 mcg by mouth 2 (two) times daily.     . Cholecalciferol (VITAMIN D) 2000 UNITS tablet Take 2,000 Units by mouth 2 (two) times daily.     Marland Kitchen EPIPEN 2-PAK 0.3 MG/0.3ML DEVI Ad lib.    Marland Kitchen escitalopram (LEXAPRO) 20 MG tablet Take 20 mg by mouth at bedtime.     Marland Kitchen FARXIGA 10 MG TABS tablet Take 10 mg by mouth daily.    . ferrous sulfate  325 (65 FE) MG tablet Take 325 mg by mouth 2 (two) times daily with a meal.     . fluconazole (DIFLUCAN) 100 MG tablet Take 2 today, then 1 as needed 30 tablet 0  . insulin aspart protamine- aspart (NOVOLOG MIX 70/30) (70-30) 100 UNIT/ML injection Inject 75 Units into the skin 2 (two) times daily with a meal.     . lisinopril (PRINIVIL,ZESTRIL) 10 MG tablet Take 1 tablet by mouth daily.    . Naltrexone-Bupropion HCl (CONTRAVE PO) Take 2 tablets by mouth 2 (two) times daily.    Glory Rosebush DELICA LANCETS 51T MISC     . pantoprazole (PROTONIX) 40 MG tablet Take 40 mg by mouth every morning.    Marland Kitchen PAZEO 0.7 % SOLN Place 1 drop into both eyes daily.    . pravastatin (PRAVACHOL) 20 MG tablet Take 20 mg by mouth at bedtime.    Marland Kitchen PROAIR HFA 108 (90 BASE) MCG/ACT inhaler     . sitaGLIPtan-metformin (JANUMET) 50-1000 MG per tablet Take 1 tablet by mouth 2 (two)  times daily with a meal.      . vitamin C (ASCORBIC ACID) 500 MG tablet Take 500 mg by mouth every other day.    . zolpidem (AMBIEN) 5 MG tablet      No current facility-administered medications for this visit.    Allergies:   Codeine    Social History:  The patient  reports that she quit smoking about 24 years ago. She does not have any smokeless tobacco history on file. She reports that she does not drink alcohol or use illicit drugs.   Family History:  The patient's family history includes Breast cancer in her paternal grandmother; Cancer in her maternal grandfather; Diabetes in her father and maternal grandmother; Heart disease in her maternal grandmother; Hypertension in her father, maternal grandmother, and mother.    ROS:  Please see the history of present illness.   Otherwise, review of systems are positive for none.   All other systems are reviewed and negative.    PHYSICAL EXAM: VS:  BP 120/88 mmHg  Pulse 94  Ht 5' 1"  (1.549 m)  Wt 189 lb 6.4 oz (85.911 kg)  BMI 35.81 kg/m2  SpO2 98% , BMI Body mass index is 35.81  kg/(m^2). GEN: Well nourished, well developed, in no acute distress HEENT: normal Neck: no JVD, carotid bruits, or masses Cardiac: RRR; no murmurs, rubs, or gallops,no edema  Respiratory:  clear to auscultation bilaterally, normal work of breathing GI: soft, nontender, nondistended, + BS MS: no deformity or atrophy Skin: warm and dry, no rash Neuro:  Strength and sensation are intact Psych: euthymic mood, full affect   EKG:  EKG is not ordered today.    Recent Labs: No results found for requested labs within last 365 days.    Lipid Panel    Component Value Date/Time   CHOL  02/07/2010 0432    72        ATP III CLASSIFICATION:  <200     mg/dL   Desirable  200-239  mg/dL   Borderline High  >=240    mg/dL   High          TRIG 47 02/07/2010 0432   HDL 36* 02/07/2010 0432   CHOLHDL 2.0 02/07/2010 0432   VLDL 9 02/07/2010 0432   LDLCALC  02/07/2010 0432    27        Total Cholesterol/HDL:CHD Risk Coronary Heart Disease Risk Table                     Men   Women  1/2 Average Risk   3.4   3.3  Average Risk       5.0   4.4  2 X Average Risk   9.6   7.1  3 X Average Risk  23.4   11.0        Use the calculated Patient Ratio above and the CHD Risk Table to determine the patient's CHD Risk.        ATP III CLASSIFICATION (LDL):  <100     mg/dL   Optimal  100-129  mg/dL   Near or Above                    Optimal  130-159  mg/dL   Borderline  160-189  mg/dL   High  >190     mg/dL   Very High      Wt Readings from Last 3 Encounters:  06/04/14 189 lb 6.4 oz (85.911 kg)  12/04/13 193 lb (87.544 kg)  11/14/13 190 lb (86.183 kg)     ASSESSMENT AND PLAN:  1. Mild OSA on CPAP - I will get a d/l from her DME 2. HTN - controlled - continue Lisinopril 3. Obesity - I have encouraged her to increase her aerobic activity to 30 minutes daily   Current medicines are reviewed at length with the patient today.  The patient does not have  concerns regarding medicines.  The following changes have been made:  no change  Labs/ tests ordered today: None  No orders of the defined types were placed in this encounter.     Disposition:   FU with me in 1 year  Signed, Sueanne Margarita, MD  06/04/2014 8:54 AM    [facility] [address], [place], [place]  [address] Phone: [phone]; Fax: [phone]

## 2014-06-04 ENCOUNTER — Encounter: Payer: Self-pay | Admitting: Cardiology

## 2014-06-04 ENCOUNTER — Ambulatory Visit (INDEPENDENT_AMBULATORY_CARE_PROVIDER_SITE_OTHER): Payer: 59 | Admitting: Cardiology

## 2014-06-04 VITALS — BP 120/88 | HR 94 | Ht 61.0 in | Wt 189.4 lb

## 2014-06-04 DIAGNOSIS — E669 Obesity, unspecified: Secondary | ICD-10-CM

## 2014-06-04 DIAGNOSIS — G4733 Obstructive sleep apnea (adult) (pediatric): Secondary | ICD-10-CM | POA: Diagnosis not present

## 2014-06-04 DIAGNOSIS — I10 Essential (primary) hypertension: Secondary | ICD-10-CM | POA: Diagnosis not present

## 2014-06-04 NOTE — Patient Instructions (Signed)

## 2014-06-28 ENCOUNTER — Encounter: Payer: Self-pay | Admitting: Cardiology

## 2014-07-01 ENCOUNTER — Other Ambulatory Visit: Payer: Self-pay

## 2014-12-02 ENCOUNTER — Other Ambulatory Visit: Payer: Self-pay

## 2014-12-02 DIAGNOSIS — Z1231 Encounter for screening mammogram for malignant neoplasm of breast: Secondary | ICD-10-CM

## 2014-12-10 ENCOUNTER — Ambulatory Visit
Admission: RE | Admit: 2014-12-10 | Discharge: 2014-12-10 | Disposition: A | Discharge status: Home / Self Care | Payer: 59 | Source: Ambulatory Visit

## 2014-12-10 DIAGNOSIS — Z1231 Encounter for screening mammogram for malignant neoplasm of breast: Secondary | ICD-10-CM

## 2014-12-11 ENCOUNTER — Encounter: Payer: Self-pay | Admitting: Women's Health

## 2014-12-17 ENCOUNTER — Ambulatory Visit (INDEPENDENT_AMBULATORY_CARE_PROVIDER_SITE_OTHER): Payer: 59 | Admitting: Women's Health

## 2014-12-17 ENCOUNTER — Other Ambulatory Visit (HOSPITAL_COMMUNITY)
Admission: RE | Admit: 2014-12-17 | Discharge: 2014-12-17 | Disposition: A | Discharge status: Home / Self Care | Payer: 59 | Source: Ambulatory Visit | Attending: Women's Health | Admitting: Women's Health

## 2014-12-17 ENCOUNTER — Encounter: Payer: Self-pay | Admitting: Women's Health

## 2014-12-17 VITALS — BP 124/80 | Ht 61.0 in | Wt 184.0 lb

## 2014-12-17 DIAGNOSIS — Z1151 Encounter for screening for human papillomavirus (HPV): Secondary | ICD-10-CM | POA: Insufficient documentation

## 2014-12-17 DIAGNOSIS — Z01419 Encounter for gynecological examination (general) (routine) without abnormal findings: Secondary | ICD-10-CM

## 2014-12-17 DIAGNOSIS — N898 Other specified noninflammatory disorders of vagina: Secondary | ICD-10-CM

## 2014-12-17 LAB — URINALYSIS W MICROSCOPIC + REFLEX CULTURE
BILIRUBIN URINE: NEGATIVE
CASTS: NONE SEEN [LPF]
Crystals: NONE SEEN [HPF]
Hgb urine dipstick: NEGATIVE
KETONES UR: NEGATIVE
LEUKOCYTES UA: NEGATIVE
NITRITE: NEGATIVE
PH: 5.5 (ref 5.0–8.0)
RBC / HPF: NONE SEEN RBC/HPF (ref <=2)

## 2014-12-17 NOTE — Addendum Note (Signed)
Addended by: Burnett Kanaris on: 12/17/2014 11:57 AM   Modules accepted: Orders

## 2014-12-17 NOTE — Addendum Note (Signed)
Addended by: Burnett Kanaris on: 12/17/2014 09:27 AM   Modules accepted: Orders

## 2014-12-17 NOTE — Addendum Note (Signed)
Addended by: Burnett Kanaris on: 12/17/2014 12:03 PM   Modules accepted: Orders

## 2014-12-17 NOTE — Progress Notes (Signed)
Mariah [name] 12-12-1970 [id number]    History:    Presents for annual exam.  Postmenopausal/no bleeding/no HRT. Normal Pap and mammogram history. Type 2 diabetes on insulin per Mariah [name]. 2012 ruptured diverticuli, abdominal hernia, history of a colostomy, reversed. Hypertension managed by primary care. 2014 normal DEXA.  Past medical history, past surgical history, family history and social history were all reviewed and documented in the EPIC chart. 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 Mariah [name] 47 graduating this year planning to go to [name]. Parents hypertension, Mariah [name] diabetes.  Mariah [name] diabetes, history of prostate cancer.  ROS:  A ROS was performed and pertinent positives and negatives are included.  Exam:  Filed Vitals:   12/17/14 0841  BP: 124/80    General appearance:  Normal Thyroid:  Symmetrical, normal in size, without palpable masses or nodularity. Respiratory  Auscultation:  Clear without wheezing or rhonchi Cardiovascular  Auscultation:  Regular rate, without rubs, murmurs or gallops  Edema/varicosities:  Not grossly evident Abdominal  Soft,nontender, without masses, guarding or rebound.  Liver/spleen:  No organomegaly noted  Hernia:  None appreciated  Skin  Inspection:  Grossly normal   Breasts: Examined lying and sitting.     Right: Without masses, retractions, discharge or axillary adenopathy.     Left: Without masses, retractions, discharge or axillary adenopathy. Gentitourinary   Inguinal/mons:  Normal without inguinal adenopathy  External genitalia:  Normal  BUS/Urethra/Skene's glands:  Normal  Vagina:  Atrophic  Cervix:  Normal  Uterus:   normal in size, shape and contour.  Midline and mobile/ limited/abdominal girth  Adnexa/parametria:     Rt: Without masses or tenderness.   Lt: Without masses or tenderness.  Anus and perineum: Normal  Digital rectal exam: Normal sphincter tone without palpated masses or  tenderness  Assessment/Plan:  54 y.o. MBF G1 P1 for annual exam with no complaints.  Postmenopausal/no HRT/no bleeding Vaginal atrophy Type 2 diabetes on insulin-Mariah [name] Hypertension primary care manages labs and meds  Plan: Congratulated on 6 pound weight loss, instructed to continue low carbohydrate diet, regular exercise. SBE's, continue annual screening mammogram, calcium rich diet, vitamin D 1000 daily encouraged. Continue vaginal lubricants with intercourse. UA, Pap with HR HPV typing. New screening guidelines reviewed.    Mariah [name] [facility], 8:47 AM 12/17/2014

## 2014-12-17 NOTE — Patient Instructions (Signed)
Health Maintenance, Female Adopting a healthy lifestyle and getting preventive care can go a long way to promote health and wellness. Talk with your health care provider about what schedule of regular examinations is right for you. This is a good chance for you to check in with your provider about disease prevention and staying healthy. In between checkups, there are plenty of things you can do on your own. Experts have done a lot of research about which lifestyle changes and preventive measures are most likely to keep you healthy. Ask your health care provider for more information. WEIGHT AND DIET  Eat a healthy diet  Be sure to include plenty of vegetables, fruits, low-fat dairy products, and lean protein.  Do not eat a lot of foods high in solid fats, added sugars, or salt.  Get regular exercise. This is one of the most important things you can do for your health.  Most adults should exercise for at least 150 minutes each week. The exercise should increase your heart rate and make you sweat (moderate-intensity exercise).  Most adults should also do strengthening exercises at least twice a week. This is in addition to the moderate-intensity exercise.  Maintain a healthy weight  Body mass index (BMI) is a measurement that can be used to identify possible weight problems. It estimates body fat based on height and weight. Your health care provider can help determine your BMI and help you achieve or maintain a healthy weight.  For females 20 years of age and older:   A BMI below 18.5 is considered underweight.  A BMI of 18.5 to 24.9 is normal.  A BMI of 25 to 29.9 is considered overweight.  A BMI of 30 and above is considered obese.  Watch levels of cholesterol and blood lipids  You should start having your blood tested for lipids and cholesterol at 54 years of age, then have this test every 5 years.  You may need to have your cholesterol levels checked more often if:  Your lipid  or cholesterol levels are high.  You are older than 54 years of age.  You are at high risk for heart disease.  CANCER SCREENING   Lung Cancer  Lung cancer screening is recommended for adults 55-80 years old who are at high risk for lung cancer because of a history of smoking.  A yearly low-dose CT scan of the lungs is recommended for people who:  Currently smoke.  Have quit within the past 15 years.  Have at least a 30-pack-year history of smoking. A pack year is smoking an average of one pack of cigarettes a day for 1 year.  Yearly screening should continue until it has been 15 years since you quit.  Yearly screening should stop if you develop a health problem that would prevent you from having lung cancer treatment.  Breast Cancer  Practice breast self-awareness. This means understanding how your breasts normally appear and feel.  It also means doing regular breast self-exams. Let your health care provider know about any changes, no matter how small.  If you are in your 20s or 30s, you should have a clinical breast exam (CBE) by a health care provider every 1-3 years as part of a regular health exam.  If you are 40 or older, have a CBE every year. Also consider having a breast X-ray (mammogram) every year.  If you have a family history of breast cancer, talk to your health care provider about genetic screening.  If you   are at high risk for breast cancer, talk to your health care provider about having an MRI and a mammogram every year.  Breast cancer gene (BRCA) assessment is recommended for women who have family members with BRCA-related cancers. BRCA-related cancers include:  Breast.  Ovarian.  Tubal.  Peritoneal cancers.  Results of the assessment will determine the need for genetic counseling and BRCA1 and BRCA2 testing. Cervical Cancer Your health care provider may recommend that you be screened regularly for cancer of the pelvic organs (ovaries, uterus, and  vagina). This screening involves a pelvic examination, including checking for microscopic changes to the surface of your cervix (Pap test). You may be encouraged to have this screening done every 3 years, beginning at age 21.  For women ages 30-65, health care providers may recommend pelvic exams and Pap testing every 3 years, or they may recommend the Pap and pelvic exam, combined with testing for human papilloma virus (HPV), every 5 years. Some types of HPV increase your risk of cervical cancer. Testing for HPV may also be done on women of any age with unclear Pap test results.  Other health care providers may not recommend any screening for nonpregnant women who are considered low risk for pelvic cancer and who do not have symptoms. Ask your health care provider if a screening pelvic exam is right for you.  If you have had past treatment for cervical cancer or a condition that could lead to cancer, you need Pap tests and screening for cancer for at least 20 years after your treatment. If Pap tests have been discontinued, your risk factors (such as having a new sexual partner) need to be reassessed to determine if screening should resume. Some women have medical problems that increase the chance of getting cervical cancer. In these cases, your health care provider may recommend more frequent screening and Pap tests. Colorectal Cancer  This type of cancer can be detected and often prevented.  Routine colorectal cancer screening usually begins at 54 years of age and continues through 54 years of age.  Your health care provider may recommend screening at an earlier age if you have risk factors for colon cancer.  Your health care provider may also recommend using home test kits to check for hidden blood in the stool.  A small camera at the end of a tube can be used to examine your colon directly (sigmoidoscopy or colonoscopy). This is done to check for the earliest forms of colorectal  cancer.  Routine screening usually begins at age 50.  Direct examination of the colon should be repeated every 5-10 years through 54 years of age. However, you may need to be screened more often if early forms of precancerous polyps or small growths are found. Skin Cancer  Check your skin from head to toe regularly.  Tell your health care provider about any new moles or changes in moles, especially if there is a change in a mole's shape or color.  Also tell your health care provider if you have a mole that is larger than the size of a pencil eraser.  Always use sunscreen. Apply sunscreen liberally and repeatedly throughout the day.  Protect yourself by wearing long sleeves, pants, a wide-brimmed hat, and sunglasses whenever you are outside. HEART DISEASE, DIABETES, AND HIGH BLOOD PRESSURE   High blood pressure causes heart disease and increases the risk of stroke. High blood pressure is more likely to develop in:  People who have blood pressure in the high end   of the normal range (130-139/85-89 mm Hg).  People who are overweight or obese.  People who are African American.  If you are 38-23 years of age, have your blood pressure checked every 3-5 years. If you are 61 years of age or older, have your blood pressure checked every year. You should have your blood pressure measured twice--once when you are at a hospital or clinic, and once when you are not at a hospital or clinic. Record the average of the two measurements. To check your blood pressure when you are not at a hospital or clinic, you can use:  An automated blood pressure machine at a pharmacy.  A home blood pressure monitor.  If you are between 45 years and 39 years old, ask your health care provider if you should take aspirin to prevent strokes.  Have regular diabetes screenings. This involves taking a blood sample to check your fasting blood sugar level.  If you are at a normal weight and have a low risk for diabetes,  have this test once every three years after 54 years of age.  If you are overweight and have a high risk for diabetes, consider being tested at a younger age or more often. PREVENTING INFECTION  Hepatitis B  If you have a higher risk for hepatitis B, you should be screened for this virus. You are considered at high risk for hepatitis B if:  You were born in a country where hepatitis B is common. Ask your health care provider which countries are considered high risk.  Your parents were born in a high-risk country, and you have not been immunized against hepatitis B (hepatitis B vaccine).  You have HIV or AIDS.  You use needles to inject street drugs.  You live with someone who has hepatitis B.  You have had sex with someone who has hepatitis B.  You get hemodialysis treatment.  You take certain medicines for conditions, including cancer, organ transplantation, and autoimmune conditions. Hepatitis C  Blood testing is recommended for:  Everyone born from [birth year] through 1965.  Anyone with known risk factors for hepatitis C. Sexually transmitted infections (STIs)  You should be screened for sexually transmitted infections (STIs) including gonorrhea and chlamydia if:  You are sexually active and are younger than 54 years of age.  You are older than 53 years of age and your health care provider tells you that you are at risk for this type of infection.  Your sexual activity has changed since you were last screened and you are at an increased risk for chlamydia or gonorrhea. Ask your health care provider if you are at risk.  If you do not have HIV, but are at risk, it may be recommended that you take a prescription medicine daily to prevent HIV infection. This is called pre-exposure prophylaxis (PrEP). You are considered at risk if:  You are sexually active and do not regularly use condoms or know the HIV status of your partner(s).  You take drugs by injection.  You are sexually  active with a partner who has HIV. Talk with your health care provider about whether you are at high risk of being infected with HIV. If you choose to begin PrEP, you should first be tested for HIV. You should then be tested every 3 months for as long as you are taking PrEP.  PREGNANCY   If you are premenopausal and you may become pregnant, ask your health care provider about preconception counseling.  If you may  become pregnant, take 400 to 800 micrograms (mcg) of folic acid every day.  If you want to prevent pregnancy, talk to your health care provider about birth control (contraception). OSTEOPOROSIS AND MENOPAUSE   Osteoporosis is a disease in which the bones lose minerals and strength with aging. This can result in serious bone fractures. Your risk for osteoporosis can be identified using a bone density scan.  If you are 20 years of age or older, or if you are at risk for osteoporosis and fractures, ask your health care provider if you should be screened.  Ask your health care provider whether you should take a calcium or vitamin D supplement to lower your risk for osteoporosis.  Menopause may have certain physical symptoms and risks.  Hormone replacement therapy may reduce some of these symptoms and risks. Talk to your health care provider about whether hormone replacement therapy is right for you.  HOME CARE INSTRUCTIONS   Schedule regular health, dental, and eye exams.  Stay current with your immunizations.   Do not use any tobacco products including cigarettes, chewing tobacco, or electronic cigarettes.  If you are pregnant, do not drink alcohol.  If you are breastfeeding, limit how much and how often you drink alcohol.  Limit alcohol intake to no more than 1 drink per day for nonpregnant women. One drink equals 12 ounces of beer, 5 ounces of wine, or 1 ounces of hard liquor.  Do not use street drugs.  Do not share needles.  Ask your health care provider for help if  you need support or information about quitting drugs.  Tell your health care provider if you often feel depressed.  Tell your health care provider if you have ever been abused or do not feel safe at home.   This information is not intended to replace advice given to you by your health care provider. Make sure you discuss any questions you have with your health care provider.   Document Released: 07/06/2010 Document Revised: 01/11/2014 Document Reviewed: 11/22/2012 Elsevier Interactive Patient Education 2016 [employer]. Diabetes Mellitus and Food It is important for you to manage your blood sugar (glucose) level. Your blood glucose level can be greatly affected by what you eat. Eating healthier foods in the appropriate amounts throughout the day at about the same time each day will help you control your blood glucose level. It can also help slow or prevent worsening of your diabetes mellitus. Healthy eating may even help you improve the level of your blood pressure and reach or maintain a healthy weight.  General recommendations for healthful eating and cooking habits include:  Eating meals and snacks regularly. Avoid going long periods of time without eating to lose weight.  Eating a diet that consists mainly of plant-based foods, such as fruits, vegetables, nuts, legumes, and whole grains.  Using low-heat cooking methods, such as baking, instead of high-heat cooking methods, such as deep frying. Work with your dietitian to make sure you understand how to use the Nutrition Facts information on food labels. HOW CAN FOOD AFFECT ME? Carbohydrates Carbohydrates affect your blood glucose level more than any other type of food. Your dietitian will help you determine how many carbohydrates to eat at each meal and teach you how to count carbohydrates. Counting carbohydrates is important to keep your blood glucose at a healthy level, especially if you are using insulin or taking certain medicines for  diabetes mellitus. Alcohol Alcohol can cause sudden decreases in blood glucose (hypoglycemia), especially if you  use insulin or take certain medicines for diabetes mellitus. Hypoglycemia can be a life-threatening condition. Symptoms of hypoglycemia (sleepiness, dizziness, and disorientation) are similar to symptoms of having too much alcohol.  If your health care provider has given you approval to drink alcohol, do so in moderation and use the following guidelines:  Women should not have more than one drink per day, and men should not have more than two drinks per day. One drink is equal to:  12 oz of beer.  5 oz of wine.  1 oz of hard liquor.  Do not drink on an empty stomach.  Keep yourself hydrated. Have water, diet soda, or unsweetened iced tea.  Regular soda, juice, and other mixers might contain a lot of carbohydrates and should be counted. WHAT FOODS ARE NOT RECOMMENDED? As you make food choices, it is important to remember that all foods are not the same. Some foods have fewer nutrients per serving than other foods, even though they might have the same number of calories or carbohydrates. It is difficult to get your body what it needs when you eat foods with fewer nutrients. Examples of foods that you should avoid that are high in calories and carbohydrates but low in nutrients include:  Trans fats (most processed foods list trans fats on the Nutrition Facts label).  Regular soda.  Juice.  Candy.  Sweets, such as cake, pie, doughnuts, and cookies.  Fried foods. WHAT FOODS CAN I EAT? Eat nutrient-rich foods, which will nourish your body and keep you healthy. The food you should eat also will depend on several factors, including:  The calories you need.  The medicines you take.  Your weight.  Your blood glucose level.  Your blood pressure level.  Your cholesterol level. You should eat a variety of foods, including:  Protein.  Lean cuts of meat.  Proteins low  in saturated fats, such as fish, egg whites, and beans. Avoid processed meats.  Fruits and vegetables.  Fruits and vegetables that may help control blood glucose levels, such as apples, mangoes, and yams.  Dairy products.  Choose fat-free or low-fat dairy products, such as milk, yogurt, and cheese.  Grains, bread, pasta, and rice.  Choose whole grain products, such as multigrain bread, whole oats, and brown rice. These foods may help control blood pressure.  Fats.  Foods containing healthful fats, such as nuts, avocado, olive oil, canola oil, and fish. DOES EVERYONE WITH DIABETES MELLITUS HAVE THE SAME MEAL PLAN? Because every person with diabetes mellitus is different, there is not one meal plan that works for everyone. It is very important that you meet with a dietitian who will help you create a meal plan that is just right for you.   This information is not intended to replace advice given to you by your health care provider. Make sure you discuss any questions you have with your health care provider.   Document Released: 09/17/2004 Document Revised: 01/11/2014 Document Reviewed: 11/17/2012 Elsevier Interactive Patient Education [employer].

## 2014-12-18 LAB — CYTOLOGY - PAP

## 2014-12-19 ENCOUNTER — Other Ambulatory Visit: Payer: Self-pay | Admitting: Women's Health

## 2014-12-19 LAB — URINE CULTURE: Colony Count: 70000

## 2014-12-19 MED ORDER — FLUCONAZOLE 150 MG PO TABS: 150.0000 mg | ORAL_TABLET | Freq: Once | ORAL | Status: DC

## 2015-03-26 ENCOUNTER — Encounter: Payer: Self-pay | Admitting: Cardiology

## 2015-05-23 ENCOUNTER — Ambulatory Visit: Payer: 59 | Admitting: Podiatry

## 2015-05-29 ENCOUNTER — Ambulatory Visit (INDEPENDENT_AMBULATORY_CARE_PROVIDER_SITE_OTHER): Payer: 59 | Admitting: Podiatry

## 2015-05-29 ENCOUNTER — Encounter: Payer: Self-pay | Admitting: Podiatry

## 2015-05-29 VITALS — BP 103/60 | HR 78 | Resp 14

## 2015-05-29 DIAGNOSIS — M79676 Pain in unspecified toe(s): Secondary | ICD-10-CM | POA: Diagnosis not present

## 2015-05-29 DIAGNOSIS — B351 Tinea unguium: Secondary | ICD-10-CM | POA: Diagnosis not present

## 2015-05-29 DIAGNOSIS — M79609 Pain in unspecified limb: Secondary | ICD-10-CM

## 2015-05-29 DIAGNOSIS — L309 Dermatitis, unspecified: Secondary | ICD-10-CM | POA: Diagnosis not present

## 2015-05-29 MED ORDER — AMMONIUM LACTATE 12 % EX CREA: TOPICAL_CREAM | CUTANEOUS | Status: DC | PRN

## 2015-05-29 MED ORDER — CLOTRIMAZOLE-BETAMETHASONE 1-0.05 % EX CREA: 1.0000 "application " | TOPICAL_CREAM | Freq: Two times a day (BID) | CUTANEOUS | Status: DC

## 2015-05-29 NOTE — Progress Notes (Signed)
   Subjective:    Patient ID: Mariah [name], female    DOB: 05/21/1960, 55 y.o.   MRN: [id number]  HPI this patient presents to my office with chief complaint of 2 brownish darkened skin lesions on her big toe. She says these have been present for approximately 2 weeks and last week. They will red and inflamed. This week. They have no evidence of any redness, swelling or inflammation noted. They are pain-free at this time. She is a diabetic and she desires to have a foot exam. She is concerned about her dry skin on the bottom of both feet. She presents the office today for an evaluation  of these conditions    Review of Systems  Constitutional: Positive for unexpected weight change.       Night sweats  Psychiatric/Behavioral:       Depression,anxiety  All other systems reviewed and are negative.      Objective:   Physical Exam GENERAL APPEARANCE: Alert, conversant. Appropriately groomed. No acute distress.  VASCULAR: Pedal pulses are  palpable at  [facility] and PT bilateral.  Capillary refill time is immediate to all digits,  Normal temperature gradient.  Digital hair growth is present bilateral  NEUROLOGIC: sensation is normal to 5.07 monofilament at 5/5 sites bilateral.  Light touch is intact bilateral, Muscle strength normal.  MUSCULOSKELETAL: acceptable muscle strength, tone and stability bilateral.  Intrinsic muscluature intact bilateral.  Rectus appearance of foot and digits noted bilateral.   DERMATOLOGIC: skin color, texture, and turgor are within normal limits.  No preulcerative lesions or ulcers  are seen, no interdigital maceration noted.  No open lesions present.   No drainage noted.Two brownish lesions right hallux with no redness or inflammation noted. Dry scaly skin noted.   NAILS Thick disfigured nails 1,5 B/L.         Assessment & Plan:  Dermatitis right hallux.  Onychomycosis  Diabetes with no foot complications.  IE  Debride nails.  Prescribe lac hydrin for  skin and lotrisone for skin lesions right hallux.   Gardiner Barefoot DPM

## 2015-09-25 ENCOUNTER — Ambulatory Visit: Payer: 59 | Admitting: Podiatry

## 2015-10-13 ENCOUNTER — Encounter: Payer: Self-pay | Admitting: Women's Health

## 2015-10-15 ENCOUNTER — Other Ambulatory Visit: Payer: Self-pay | Admitting: Women's Health

## 2015-10-15 ENCOUNTER — Telehealth: Payer: Self-pay

## 2015-10-15 MED ORDER — FLUCONAZOLE 150 MG PO TABS: ORAL_TABLET | ORAL | 0 refills | Status: DC

## 2015-10-15 NOTE — Telephone Encounter (Signed)
Message left, Diflucan Rx E scribe to [facility], office visit best to check UA.

## 2015-10-15 NOTE — Telephone Encounter (Addendum)
Patient is diabetic and complaining of vaginal yeast infection with itching. Familiar w symptoms and would like to get Rx for Diflucan. Is very uncomfortable and would like to get this asap this evening if possible.  2nd issue is she said she had sex with husband the other day and he used a lubricant. Since then she is having burning right at her clitoris where the urine comes out and it is burning terribly when she goes to the bathroom.  I recommended office visit to rule out UTI and to let you check that spot if needed. She is fine with office visit and Rosemarie Ax will be calling her to schedule for tomorrow/

## 2015-10-16 NOTE — Telephone Encounter (Signed)
Message left

## 2015-10-17 ENCOUNTER — Ambulatory Visit (INDEPENDENT_AMBULATORY_CARE_PROVIDER_SITE_OTHER): Payer: 59 | Admitting: Women's Health

## 2015-10-17 ENCOUNTER — Encounter: Payer: Self-pay | Admitting: Women's Health

## 2015-10-17 VITALS — BP 126/82 | Ht 61.0 in | Wt 184.0 lb

## 2015-10-17 DIAGNOSIS — N898 Other specified noninflammatory disorders of vagina: Secondary | ICD-10-CM | POA: Diagnosis not present

## 2015-10-17 DIAGNOSIS — B373 Candidiasis of vulva and vagina: Secondary | ICD-10-CM

## 2015-10-17 DIAGNOSIS — B3731 Acute candidiasis of vulva and vagina: Secondary | ICD-10-CM

## 2015-10-17 LAB — WET PREP FOR TRICH, YEAST, CLUE
CLUE CELLS WET PREP: NONE SEEN
TRICH WET PREP: NONE SEEN
WBC WET PREP: NONE SEEN

## 2015-10-17 MED ORDER — FLUCONAZOLE 100 MG PO TABS: ORAL_TABLET | ORAL | 0 refills | Status: DC

## 2015-10-17 NOTE — Patient Instructions (Addendum)
Basic Carbohydrate Counting for Diabetes Mellitus Carbohydrate counting is a method for keeping track of the amount of carbohydrates you eat. Eating carbohydrates naturally increases the level of sugar (glucose) in your blood, so it is important for you to know the amount that is okay for you to have in every meal. Carbohydrate counting helps keep the level of glucose in your blood within normal limits. The amount of carbohydrates allowed is different for every person. A dietitian can help you calculate the amount that is right for you. Once you know the amount of carbohydrates you can have, you can count the carbohydrates in the foods you want to eat. Carbohydrates are found in the following foods:  Grains, such as breads and cereals.  Dried beans and soy products.  Starchy vegetables, such as potatoes, peas, and corn.  Fruit and fruit juices.  Milk and yogurt.  Sweets and snack foods, such as cake, cookies, candy, chips, soft drinks, and fruit drinks. CARBOHYDRATE COUNTING There are two ways to count the carbohydrates in your food. You can use either of the methods or a combination of both. Reading the "Nutrition Facts" on [place] The "Nutrition Facts" is an area that is included on the labels of almost all packaged food and beverages in the [place]. It includes the serving size of that food or beverage and information about the nutrients in each serving of the food, including the grams (g) of carbohydrate per serving.  Decide the number of servings of this food or beverage that you will be able to eat or drink. Multiply that number of servings by the number of grams of carbohydrate that is listed on the label for that serving. The total will be the amount of carbohydrates you will be having when you eat or drink this food or beverage. Learning Standard Serving Sizes of Food When you eat food that is not packaged or does not include "Nutrition Facts" on the label, you need to  measure the servings in order to count the amount of carbohydrates.A serving of most carbohydrate-rich foods contains about 15 g of carbohydrates. The following list includes serving sizes of carbohydrate-rich foods that provide 15 g ofcarbohydrate per serving:   1 slice of bread (1 oz) or 1 six-inch tortilla.    of a hamburger bun or English muffin.  4-6 crackers.   cup unsweetened dry cereal.    cup hot cereal.   cup rice or pasta.    cup mashed potatoes or  of a large baked potato.  1 cup fresh fruit or one small piece of fruit.    cup canned or frozen fruit or fruit juice.  1 cup milk.   cup plain fat-free yogurt or yogurt sweetened with artificial sweeteners.   cup cooked dried beans or starchy vegetable, such as peas, corn, or potatoes.  Decide the number of standard-size servings that you will eat. Multiply that number of servings by 15 (the grams of carbohydrates in that serving). For example, if you eat 2 cups of strawberries, you will have eaten 2 servings and 30 g of carbohydrates (2 servings x 15 g = 30 g). For foods such as soups and casseroles, in which more than one food is mixed in, you will need to count the carbohydrates in each food that is included. EXAMPLE OF CARBOHYDRATE COUNTING Sample Dinner  3 oz chicken breast.   cup of brown rice.   cup of corn.  1 cup milk.   1 cup strawberries with  sugar-free whipped topping.  Carbohydrate Calculation Step 1: Identify the foods that contain carbohydrates:   Rice.   Corn.   Milk.   Strawberries. Step 2:Calculate the number of servings eaten of each:   2 servings of rice.   1 serving of corn.   1 serving of milk.   1 serving of strawberries. Step 3: Multiply each of those number of servings by 15 g:   2 servings of rice x 15 g = 30 g.   1 serving of corn x 15 g = 15 g.   1 serving of milk x 15 g = 15 g.   1 serving of strawberries x 15 g = 15 g. Step 4: Add  together all of the amounts to find the total grams of carbohydrates eaten: 30 g + 15 g + 15 g + 15 g = 75 g.   This information is not intended to replace advice given to you by your health care provider. Make sure you discuss any questions you have with your health care provider.   Document Released: 12/21/2004 Document Revised: 01/11/2014 Document Reviewed: 11/17/2012 Elsevier Interactive Patient Education 2016 Else[employer] Monilial Vaginitis Vaginitis in a soreness, swelling and redness (inflammation) of the vagina and vulva. Monilial vaginitis is not a sexually transmitted infection. CAUSES  Yeast vaginitis is caused by yeast (candida) that is normally found in your vagina. With a yeast infection, the candida has overgrown in number to a point that upsets the chemical balance. SYMPTOMS   White, thick vaginal discharge.  Swelling, itching, redness and irritation of the vagina and possibly the lips of the vagina (vulva).  Burning or painful urination.  Painful intercourse. DIAGNOSIS  Things that may contribute to monilial vaginitis are:  Postmenopausal and virginal states.  Pregnancy.  Infections.  Being tired, sick or stressed, especially if you had monilial vaginitis in the past.  Diabetes. Good control will help lower the chance.  Birth control pills.  Tight fitting garments.  Using bubble bath, feminine sprays, douches or deodorant tampons.  Taking certain medications that kill germs (antibiotics).  Sporadic recurrence can occur if you become ill. TREATMENT  Your caregiver will give you medication.  There are several kinds of anti monilial vaginal creams and suppositories specific for monilial vaginitis. For recurrent yeast infections, use a suppository or cream in the vagina 2 times a week, or as directed.  Anti-monilial or steroid cream for the itching or irritation of the vulva may also be used. Get your caregiver's permission.  Painting the vagina with  methylene blue solution may help if the monilial cream does not work.  Eating yogurt may help prevent monilial vaginitis. HOME CARE INSTRUCTIONS   Finish all medication as prescribed.  Do not have sex until treatment is completed or after your caregiver tells you it is okay.  Take warm sitz baths.  Do not douche.  Do not use tampons, especially scented ones.  Wear cotton underwear.  Avoid tight pants and panty hose.  Tell your sexual partner that you have a yeast infection. They should go to their caregiver if they have symptoms such as mild rash or itching.  Your sexual partner should be treated as well if your infection is difficult to eliminate.  Practice safer sex. Use condoms.  Some vaginal medications cause latex condoms to fail. Vaginal medications that harm condoms are:  Cleocin cream.  Butoconazole (Femstat).  Terconazole (Terazol) vaginal suppository.  Miconazole (Monistat) (may be purchased over the counter). SEEK MEDICAL CARE IF:  You have a temperature by mouth above 102 F (38.9 C).  The infection is getting worse after 2 days of treatment.  The infection is not getting better after 3 days of treatment.  You develop blisters in or around your vagina.  You develop vaginal bleeding, and it is not your menstrual period.  You have pain when you urinate.  You develop intestinal problems.  You have pain with sexual intercourse.   This information is not intended to replace advice given to you by your health care provider. Make sure you discuss any questions you have with your health care provider.   Document Released: 09/30/2004 Document Revised: 03/15/2011 Document Reviewed: 06/24/2014 Elsevier Interactive Patient Education [employer].

## 2015-10-17 NOTE — Progress Notes (Signed)
Presents with vaginal itching/irritation and dysuria for a week. Burning with urination occurred a couple of days after intercourse.  Admits to white vaginal discharge and painful  urination as urine touches the skin. Intense vaginal itching was treated with Diflucan 150 mg2 days ago with some relief but continues with painful spot near vagina and some itching. Denies abdominal cramping, vaginal odor, urinary frequency. 10/16/2015 UA negative at urgent care/ her workplace. Hx of type 2 diabetes, currently uncontrolled on insulin. States that glucose readings at home range from 275-300's.   Exam: Appears well. No CVA tenderness. External genitalia with erythema. Small fissure present at the clitoral hood. Speculum exam: moderate amount of white, clumpy discharge with no odor. Wett prep: few yeast present.   Small vaginal fissure at clitoral hood Yeast Vaginitis Uncontrolled type 2 diabetes   Plan: Diflucan 17m 2 today and repeat in 3 days if needed,  prescription given instructions reviewed. Discussed the importance of a heart healthy, low carb diet to aid in glycemic control, viewed correlation of elevated blood sugars and vaginal yeast infections.. Neosporin  applied to fissure site. Discussed longer healing time r/t Diabetes.  Instructed to call if continued problems.

## 2015-11-18 ENCOUNTER — Other Ambulatory Visit: Payer: Self-pay | Admitting: Family Medicine

## 2015-11-18 DIAGNOSIS — Z1231 Encounter for screening mammogram for malignant neoplasm of breast: Secondary | ICD-10-CM

## 2015-12-11 ENCOUNTER — Ambulatory Visit: Payer: 59

## 2015-12-11 ENCOUNTER — Ambulatory Visit: Payer: 59 | Admitting: Skilled Nursing Facility1

## 2015-12-19 ENCOUNTER — Encounter: Payer: Self-pay | Admitting: Women's Health

## 2015-12-19 ENCOUNTER — Ambulatory Visit (INDEPENDENT_AMBULATORY_CARE_PROVIDER_SITE_OTHER): Payer: 59 | Admitting: Women's Health

## 2015-12-19 VITALS — BP 124/80 | Ht 61.0 in | Wt 171.0 lb

## 2015-12-19 DIAGNOSIS — Z13820 Encounter for screening for osteoporosis: Secondary | ICD-10-CM

## 2015-12-19 DIAGNOSIS — Z01419 Encounter for gynecological examination (general) (routine) without abnormal findings: Secondary | ICD-10-CM

## 2015-12-19 NOTE — Patient Instructions (Signed)
Carbohydrate Counting for Diabetes Mellitus, Adult Carbohydrate counting is a method for keeping track of how many carbohydrates you eat. Eating carbohydrates naturally increases the amount of sugar (glucose) in the blood. Counting how many carbohydrates you eat helps keep your blood glucose within normal limits, which helps you manage your diabetes (diabetes mellitus). It is important to know how many carbohydrates you can safely have in each meal. This is different for every person. A diet and nutrition specialist (registered dietitian) can help you make a meal plan and calculate how many carbohydrates you should have at each meal and snack. Carbohydrates are found in the following foods:  Grains, such as breads and cereals.  Dried beans and soy products.  Starchy vegetables, such as potatoes, peas, and corn.  Fruit and fruit juices.  Milk and yogurt.  Sweets and snack foods, such as cake, cookies, candy, chips, and soft drinks. How do I count carbohydrates? There are two ways to count carbohydrates in food. You can use either of the methods or a combination of both. Reading "Nutrition Facts" on packaged food  The "Nutrition Facts" list is included on the labels of almost all packaged foods and beverages in the [place] It includes:  The serving size.  Information about nutrients in each serving, including the grams (g) of carbohydrate per serving. To use the "Nutrition Facts":  Decide how many servings you will have.  Multiply the number of servings by the number of carbohydrates per serving.  The resulting number is the total amount of carbohydrates that you will be having. Learning standard serving sizes of other foods  When you eat foods containing carbohydrates that are not packaged or do not include "Nutrition Facts" on the label, you need to measure the servings in order to count the amount of carbohydrates:  Measure the foods that you will eat with a food scale or measuring  cup, if needed.  Decide how many standard-size servings you will eat.  Multiply the number of servings by 15. Most carbohydrate-rich foods have about 15 g of carbohydrates per serving.  For example, if you eat 8 oz (170 g) of strawberries, you will have eaten 2 servings and 30 g of carbohydrates (2 servings x 15 g = 30 g).  For foods that have more than one food mixed, such as soups and casseroles, you must count the carbohydrates in each food that is included. The following list contains standard serving sizes of common carbohydrate-rich foods. Each of these servings has about 15 g of carbohydrates:   hamburger bun or  English muffin.   oz (15 mL) syrup.   oz (14 g) jelly.  1 slice of bread.  1 six-inch tortilla.  3 oz (85 g) cooked rice or pasta.  4 oz (113 g) cooked dried beans.  4 oz (113 g) starchy vegetable, such as peas, corn, or potatoes.  4 oz (113 g) hot cereal.  4 oz (113 g) mashed potatoes or  of a large baked potato.  4 oz (113 g) canned or frozen fruit.  4 oz (120 mL) fruit juice.  4-6 crackers.  6 chicken nuggets.  6 oz (170 g) unsweetened dry cereal.  6 oz (170 g) plain fat-free yogurt or yogurt sweetened with artificial sweeteners.  8 oz (240 mL) milk.  8 oz (170 g) fresh fruit or one small piece of fruit.  24 oz (680 g) popped popcorn. Example of carbohydrate counting Sample meal  3 oz (85 g) chicken breast.  6 oz (  170 g) brown rice.  4 oz (113 g) corn.  8 oz (240 mL) milk.  8 oz (170 g) strawberries with sugar-free whipped topping. Carbohydrate calculation 1. Identify the foods that contain carbohydrates:  Rice.  Corn.  Milk.  Strawberries. 2. Calculate how many servings you have of each food:  2 servings rice.  1 serving corn.  1 serving milk.  1 serving strawberries. 3. Multiply each number of servings by 15 g:  2 servings rice x 15 g = 30 g.  1 serving corn x 15 g = 15 g.  1 serving milk x 15 g = 15  g.  1 serving strawberries x 15 g = 15 g. 4. Add together all of the amounts to find the total grams of carbohydrates eaten:  30 g + 15 g + 15 g + 15 g = 75 g of carbohydrates total. This information is not intended to replace advice given to you by your health care provider. Make sure you discuss any questions you have with your health care provider. Document Released: 12/21/2004 Document Revised: 07/11/2015 Document Reviewed: 06/04/2015 Elsevier Interactive Patient Education  2017 [place] Maintenance for Postmenopausal Women Introduction Menopause is a normal process in which your reproductive ability comes to an end. This process happens gradually over a span of months to years, usually between the ages of 6 and 25. Menopause is complete when you have missed 12 consecutive menstrual periods. It is important to talk with your health care provider about some of the most common conditions that affect postmenopausal women, such as heart disease, cancer, and bone loss (osteoporosis). Adopting a healthy lifestyle and getting preventive care can help to promote your health and wellness. Those actions can also lower your chances of developing some of these common conditions. What should I know about menopause? During menopause, you may experience a number of symptoms, such as:  Moderate-to-severe hot flashes.  Night sweats.  Decrease in sex drive.  Mood swings.  Headaches.  Tiredness.  Irritability.  Memory problems.  Insomnia. Choosing to treat or not to treat menopausal changes is an individual decision that you make with your health care provider. What should I know about hormone replacement therapy and supplements? Hormone therapy products are effective for treating symptoms that are associated with menopause, such as hot flashes and night sweats. Hormone replacement carries certain risks, especially as you become older. If you are thinking about using estrogen or  estrogen with progestin treatments, discuss the benefits and risks with your health care provider. What should I know about heart disease and stroke? Heart disease, heart attack, and stroke become more likely as you age. This may be due, in part, to the hormonal changes that your body experiences during menopause. These can affect how your body processes dietary fats, triglycerides, and cholesterol. Heart attack and stroke are both medical emergencies. There are many things that you can do to help prevent heart disease and stroke:  Have your blood pressure checked at least every 1-2 years. High blood pressure causes heart disease and increases the risk of stroke.  If you are 46-59 years old, ask your health care provider if you should take aspirin to prevent a heart attack or a stroke.  Do not use any tobacco products, including cigarettes, chewing tobacco, or electronic cigarettes. If you need help quitting, ask your health care provider.  It is important to eat a healthy diet and maintain a healthy weight.  Be sure to include plenty of  vegetables, fruits, low-fat dairy products, and lean protein.  Avoid eating foods that are high in solid fats, added sugars, or salt (sodium).  Get regular exercise. This is one of the most important things that you can do for your health.  Try to exercise for at least 150 minutes each week. The type of exercise that you do should increase your heart rate and make you sweat. This is known as moderate-intensity exercise.  Try to do strengthening exercises at least twice each week. Do these in addition to the moderate-intensity exercise.  Know your numbers.Ask your health care provider to check your cholesterol and your blood glucose. Continue to have your blood tested as directed by your health care provider. What should I know about cancer screening? There are several types of cancer. Take the following steps to reduce your risk and to catch any cancer  development as early as possible. Breast Cancer  Practice breast self-awareness.  This means understanding how your breasts normally appear and feel.  It also means doing regular breast self-exams. Let your health care provider know about any changes, no matter how small.  If you are 46 or older, have a clinician do a breast exam (clinical breast exam or CBE) every year. Depending on your age, family history, and medical history, it may be recommended that you also have a yearly breast X-ray (mammogram).  If you have a family history of breast cancer, talk with your health care provider about genetic screening.  If you are at high risk for breast cancer, talk with your health care provider about having an MRI and a mammogram every year.  Breast cancer (BRCA) gene test is recommended for women who have family members with BRCA-related cancers. Results of the assessment will determine the need for genetic counseling and BRCA1 and for BRCA2 testing. BRCA-related cancers include these types:  Breast. This occurs in males or females.  Ovarian.  Tubal. This may also be called fallopian tube cancer.  Cancer of the abdominal or pelvic lining (peritoneal cancer).  Prostate.  Pancreatic. Cervical, Uterine, and Ovarian Cancer  Your health care provider may recommend that you be screened regularly for cancer of the pelvic organs. These include your ovaries, uterus, and vagina. This screening involves a pelvic exam, which includes checking for microscopic changes to the surface of your cervix (Pap test).  For women ages 21-65, health care providers may recommend a pelvic exam and a Pap test every three years. For women ages 67-65, they may recommend the Pap test and pelvic exam, combined with testing for human papilloma virus (HPV), every five years. Some types of HPV increase your risk of cervical cancer. Testing for HPV may also be done on women of any age who have unclear Pap test  results.  Other health care providers may not recommend any screening for nonpregnant women who are considered low risk for pelvic cancer and have no symptoms. Ask your health care provider if a screening pelvic exam is right for you.  If you have had past treatment for cervical cancer or a condition that could lead to cancer, you need Pap tests and screening for cancer for at least 20 years after your treatment. If Pap tests have been discontinued for you, your risk factors (such as having a new sexual partner) need to be reassessed to determine if you should start having screenings again. Some women have medical problems that increase the chance of getting cervical cancer. In these cases, your health care provider  may recommend that you have screening and Pap tests more often.  If you have a family history of uterine cancer or ovarian cancer, talk with your health care provider about genetic screening.  If you have vaginal bleeding after reaching menopause, tell your health care provider.  There are currently no reliable tests available to screen for ovarian cancer. Lung Cancer  Lung cancer screening is recommended for adults 70-66 years old who are at high risk for lung cancer because of a history of smoking. A yearly low-dose CT scan of the lungs is recommended if you:  Currently smoke.  Have a history of at least 30 pack-years of smoking and you currently smoke or have quit within the past 15 years. A pack-year is smoking an average of one pack of cigarettes per day for one year. Yearly screening should:  Continue until it has been 15 years since you quit.  Stop if you develop a health problem that would prevent you from having lung cancer treatment. Colorectal Cancer  This type of cancer can be detected and can often be prevented.  Routine colorectal cancer screening usually begins at age 31 and continues through age 58.  If you have risk factors for colon cancer, your health care  provider may recommend that you be screened at an earlier age.  If you have a family history of colorectal cancer, talk with your health care provider about genetic screening.  Your health care provider may also recommend using home test kits to check for hidden blood in your stool.  A small camera at the end of a tube can be used to examine your colon directly (sigmoidoscopy or colonoscopy). This is done to check for the earliest forms of colorectal cancer.  Direct examination of the colon should be repeated every 5-10 years until age 44. However, if early forms of precancerous polyps or small growths are found or if you have a family history or genetic risk for colorectal cancer, you may need to be screened more often. Skin Cancer  Check your skin from head to toe regularly.  Monitor any moles. Be sure to tell your health care provider:  About any new moles or changes in moles, especially if there is a change in a mole's shape or color.  If you have a mole that is larger than the size of a pencil eraser.  If any of your family members has a history of skin cancer, especially at a Mariah [name] age, talk with your health care provider about genetic screening.  Always use sunscreen. Apply sunscreen liberally and repeatedly throughout the day.  Whenever you are outside, protect yourself by wearing long sleeves, pants, a wide-brimmed hat, and sunglasses. What should I know about osteoporosis? Osteoporosis is a condition in which bone destruction happens more quickly than new bone creation. After menopause, you may be at an increased risk for osteoporosis. To help prevent osteoporosis or the bone fractures that can happen because of osteoporosis, the following is recommended:  If you are 43-27 years old, get at least 1,000 mg of calcium and at least 600 mg of vitamin D per day.  If you are older than age 75 but younger than age 44, get at least 1,200 mg of calcium and at least 600 mg of vitamin D  per day.  If you are older than age 64, get at least 1,200 mg of calcium and at least 800 mg of vitamin D per day. Smoking and excessive alcohol intake increase the risk  of osteoporosis. Eat foods that are rich in calcium and vitamin D, and do weight-bearing exercises several times each week as directed by your health care provider. What should I know about how menopause affects my mental health? Depression may occur at any age, but it is more common as you become older. Common symptoms of depression include:  Low or sad mood.  Changes in sleep patterns.  Changes in appetite or eating patterns.  Feeling an overall lack of motivation or enjoyment of activities that you previously enjoyed.  Frequent crying spells. Talk with your health care provider if you think that you are experiencing depression. What should I know about immunizations? It is important that you get and maintain your immunizations. These include:  Tetanus, diphtheria, and pertussis (Tdap) booster vaccine.  Influenza every year before the flu season begins.  Pneumonia vaccine.  Shingles vaccine. Your health care provider may also recommend other immunizations. This information is not intended to replace advice given to you by your health care provider. Make sure you discuss any questions you have with your health care provider. Document Released: 02/12/2005 Document Revised: 07/11/2015 Document Reviewed: 09/24/2014  2017 Elsevier

## 2015-12-19 NOTE — Progress Notes (Signed)
Mariah [name] [birth year] [id number]    History:    Presents for annual exam.  Postmenopausal, no HRT, no bleeding. Normal Pap and mammogram history.  DEXA 2014 normal. Type 2 diabetes, on insulin per Dr. Chalmers Cater, having great difficulty lowering hemoglobin A1c Dr. Chalmers Cater referring to a specialist, has lost 13 pounds in the past year with no help of lowering hemoglobin A1c. 2012 ruptured diverticuli, abdominal hernia, history of a colostomy, reversed.    Past medical history, past surgical history, family history and social history were all reviewed and documented in the EPIC chart. Works at [employer].  Husband diabetes, history of prostate cancer. Adopted son, [name], Ship broker at [employer].  Parents hypertension, mother diabetes.  ROS:  A ROS was performed and pertinent positives and negatives are included.  Exam:  Vitals:   12/19/15 0842  BP: 124/80  Weight: 171 lb (77.6 kg)  Height: 5' 1"  (1.549 m)   Body mass index is 32.31 kg/m.   General appearance:  Normal Thyroid:  Symmetrical, normal in size, without palpable masses or nodularity. Respiratory  Auscultation:  Clear without wheezing or rhonchi Cardiovascular  Auscultation:  Regular rate, without rubs, murmurs or gallops  Edema/varicosities:  Not grossly evident Abdominal  Soft,nontender, without masses, guarding or rebound.  Liver/spleen:  No organomegaly noted  Hernia:  None appreciated  Skin  Inspection:  Grossly normal   Breasts: Examined lying and sitting.     Right: Without masses, retractions, discharge or axillary adenopathy.     Left: Without masses, retractions, discharge or axillary adenopathy. Gentitourinary   Inguinal/mons:  Normal without inguinal adenopathy  External genitalia:  Normal  BUS/Urethra/[place]'s glands:  Normal  Vagina:  Atrophic  Cervix:  Normal  Uterus:  Normal in size, shape and contour.  Midline and mobile  Adnexa/parametria:     Rt: Without masses or  tenderness.   Lt: Without masses or tenderness.  Anus and perineum: Normal  Digital rectal exam: Normal sphincter tone without palpated masses or tenderness  Assessment/Plan:  56 y.o.MBF G0 +1 adoped  for annual exam with no complaints.   Postmenopausal, no HRT, no bleeding Vaginal atrophy Type 2 diabetes on insulin, elevated hemoglobin A1c's, managed by Dr. Chalmers Cater Attention/hypercholesterolemia-primary care managing History of diverticulitis with ruptured diverticuli  Plan: Congratulated on 13 pound weight loss. Encouraged to continue low carb diet and get regular weight bearing exercise.  SBEs, calcium rich diet. Vaginal lubricants with intercourse. Instructed to schedule DEXA 2016 Pap normal with HR HPV, reviewed new guidelines. Mammogram scheduled 01/01/2016.     [name] [facility], 8:57 AM 12/19/2015

## 2015-12-25 ENCOUNTER — Encounter: Payer: 59 | Attending: Endocrinology | Admitting: Skilled Nursing Facility1

## 2015-12-25 ENCOUNTER — Encounter: Payer: Self-pay | Admitting: Skilled Nursing Facility1

## 2015-12-25 DIAGNOSIS — E119 Type 2 diabetes mellitus without complications: Secondary | ICD-10-CM

## 2015-12-25 DIAGNOSIS — E1165 Type 2 diabetes mellitus with hyperglycemia: Secondary | ICD-10-CM | POA: Insufficient documentation

## 2015-12-25 DIAGNOSIS — Z794 Long term (current) use of insulin: Secondary | ICD-10-CM

## 2015-12-25 DIAGNOSIS — Z713 Dietary counseling and surveillance: Secondary | ICD-10-CM | POA: Insufficient documentation

## 2015-12-25 NOTE — Progress Notes (Signed)
Diabetes Self-Management Education  Visit Type: Follow-up  Appt. Start Time: 2:30 Appt. End Time: 3:30  12/25/2015  Ms. Mariah [name], identified by name and date of birth, is a 55 y.o. female with a diagnosis of Diabetes:  .   ASSESSMENT  Height 5' (1.524 m), weight 173 lb (78.5 kg). Body mass index is 33.79 kg/m. Pt states she does not know why she is here, "They" just made me come. Pt states she is plateuing with her blood sugar numbers: pt states her sugars are supposed to be between 70-170, blood sugars regularly above that can not get them down with an A1C of 14.4. Pt states she is feeling Defeated. Pt states her Husbands A1C is 7 which discourages her. Pt states she is avoiding nuts and popcorn due to diverticulitis. Pt sat with her arms crossed throughout the appointment. Pt states she checks her sugars fasting and also before eating dinner. Pt states she does not do anything if her blood sugars are over 200. Pt states she has lost 17 pounds. Pt states she is going to see a diabetes specialist in a few weeks.      Diabetes Self-Management Education - 12/25/15 1427      Visit Information   Visit Type Follow-up     Health Coping   How would you rate your overall health? Good     Pre-Education Assessment   Patient understands the diabetes disease and treatment process. Needs Review   Patient understands incorporating nutritional management into lifestyle. Needs Review   Patient undertands incorporating physical activity into lifestyle. Needs Review   Patient understands using medications safely. Needs Review   Patient understands monitoring blood glucose, interpreting and using results Needs Review   Patient understands prevention, detection, and treatment of acute complications. Needs Review   Patient understands prevention, detection, and treatment of chronic complications. Needs Review   Patient understands how to develop strategies to address psychosocial issues.  Needs Review   Patient understands how to develop strategies to promote health/change behavior. Needs Review     Complications   Last HgB A1C per patient/outside source 14.4 %   How often do you check your blood sugar? 1-2 times/day   Have you had a dilated eye exam in the past 12 months? Yes   Have you had a dental exam in the past 12 months? Yes   Are you checking your feet? Yes   How many days per week are you checking your feet? 7     Dietary Intake   Breakfast none----oatmeal   Lunch shrimp scampi   Dinner tuna and chips   Beverage(s) sweet tea, coffee with flavored creamers and 5-6 packs of sugar, soda, water     Exercise   Exercise Type ADL's     Patient Education   Previous Diabetes Education Yes (please comment)   Nutrition management  Role of diet in the treatment of diabetes and the relationship between the three main macronutrients and blood glucose level;Food label reading, portion sizes and measuring food.;Carbohydrate counting;Reviewed blood glucose goals for pre and post meals and how to evaluate the patients' food intake on their blood glucose level.;Meal timing in regards to the patients' current diabetes medication.;Information on hints to eating out and maintain blood glucose control.   Physical activity and exercise  Role of exercise on diabetes management, blood pressure control and cardiac health.;Identified with patient nutritional and/or medication changes necessary with exercise.   Monitoring Yearly dilated eye exam;Daily foot exams;Purpose and frequency of  SMBG.   Chronic complications Assessed and discussed foot care and prevention of foot problems;Retinopathy and reason for yearly dilated eye exams;Dental care   Psychosocial adjustment Worked with patient to identify barriers to care and solutions;Role of stress on diabetes     Individualized Goals (developed by patient)   Physical Activity Exercise 3-5 times per week;15 minutes per day;30 minutes per day      Post-Education Assessment   Patient understands the diabetes disease and treatment process. Demonstrates understanding / competency   Patient understands incorporating nutritional management into lifestyle. Demonstrates understanding / competency   Patient undertands incorporating physical activity into lifestyle. Demonstrates understanding / competency   Patient understands using medications safely. Demonstrates understanding / competency   Patient understands monitoring blood glucose, interpreting and using results Demonstrates understanding / competency   Patient understands prevention, detection, and treatment of acute complications. Demonstrates understanding / competency   Patient understands prevention, detection, and treatment of chronic complications. Demonstrates understanding / competency   Patient understands how to develop strategies to address psychosocial issues. Demonstrates understanding / competency   Patient understands how to develop strategies to promote health/change behavior. Demonstrates understanding / competency     Outcomes   Expected Outcomes Demonstrated interest in learning. Expect positive outcomes   Future DMSE PRN   Program Status Completed     Subsequent Visit   Since your last visit have you continued or begun to take your medications as prescribed? Yes      Individualized Plan for Diabetes Self-Management Training:   Learning Objective:  Patient will have a greater understanding of diabetes self-management. Patient education plan is to attend individual and/or group sessions per assessed needs and concerns.   Plan:   Patient Instructions  -Try to get to the gym 3 days a week 15-30 minutes at a time; try to be as active as possible to get those numbers down   -Keep doing your best to control your stress  -Every time you eat a carbohydrate choice have a protein with it  -Consistency and structure are the keys to controlling your diabetes    -When buying fruit you never want it packed in syrup   -Aim for 2-3 carbohydrate choices per meal and 1 for snack    Expected Outcomes:  Demonstrated interest in learning. Expect positive outcomes  Education material provided: Living Well with Diabetes, A1C conversion sheet, Meal plan card, My Plate and Snack sheet  If problems or questions, patient to contact team via:  Phone  Future DSME appointment: PRN

## 2015-12-25 NOTE — Patient Instructions (Addendum)
-  Try to get to the gym 3 days a week 15-30 minutes at a time; try to be as active as possible to get those numbers down   -Keep doing your best to control your stress  -Every time you eat a carbohydrate choice have a protein with it  -Consistency and structure are the keys to controlling your diabetes   -When buying fruit you never want it packed in syrup   -Aim for 2-3 carbohydrate choices per meal and 1 for snack

## 2016-01-01 ENCOUNTER — Ambulatory Visit
Admission: RE | Admit: 2016-01-01 | Discharge: 2016-01-01 | Disposition: A | Discharge status: Home / Self Care | Payer: 59 | Source: Ambulatory Visit | Attending: Family Medicine | Admitting: Family Medicine

## 2016-01-01 DIAGNOSIS — Z1231 Encounter for screening mammogram for malignant neoplasm of breast: Secondary | ICD-10-CM

## 2016-01-02 ENCOUNTER — Other Ambulatory Visit: Payer: Self-pay | Admitting: Family Medicine

## 2016-01-02 ENCOUNTER — Ambulatory Visit: Payer: 59

## 2016-01-02 DIAGNOSIS — R928 Other abnormal and inconclusive findings on diagnostic imaging of breast: Secondary | ICD-10-CM

## 2016-01-13 ENCOUNTER — Other Ambulatory Visit: Payer: 59

## 2016-01-20 ENCOUNTER — Other Ambulatory Visit: Payer: 59

## 2016-02-03 ENCOUNTER — Other Ambulatory Visit: Payer: Self-pay | Admitting: Family Medicine

## 2016-02-03 DIAGNOSIS — R928 Other abnormal and inconclusive findings on diagnostic imaging of breast: Secondary | ICD-10-CM

## 2016-02-10 ENCOUNTER — Other Ambulatory Visit: Payer: 59

## 2016-02-20 ENCOUNTER — Ambulatory Visit
Admission: RE | Admit: 2016-02-20 | Discharge: 2016-02-20 | Disposition: A | Discharge status: Home / Self Care | Payer: Self-pay | Source: Ambulatory Visit | Attending: Family Medicine | Admitting: Family Medicine

## 2016-02-20 DIAGNOSIS — R928 Other abnormal and inconclusive findings on diagnostic imaging of breast: Secondary | ICD-10-CM | POA: Diagnosis not present

## 2016-03-15 DIAGNOSIS — G4733 Obstructive sleep apnea (adult) (pediatric): Secondary | ICD-10-CM | POA: Diagnosis not present

## 2016-03-31 DIAGNOSIS — H40023 Open angle with borderline findings, high risk, bilateral: Secondary | ICD-10-CM | POA: Diagnosis not present

## 2016-03-31 DIAGNOSIS — E119 Type 2 diabetes mellitus without complications: Secondary | ICD-10-CM | POA: Diagnosis not present

## 2016-04-01 DIAGNOSIS — L245 Irritant contact dermatitis due to other chemical products: Secondary | ICD-10-CM | POA: Diagnosis not present

## 2016-04-13 ENCOUNTER — Encounter: Payer: Self-pay | Admitting: Podiatry

## 2016-04-13 ENCOUNTER — Ambulatory Visit (INDEPENDENT_AMBULATORY_CARE_PROVIDER_SITE_OTHER): Payer: Commercial Managed Care - HMO | Admitting: Podiatry

## 2016-04-13 ENCOUNTER — Ambulatory Visit (INDEPENDENT_AMBULATORY_CARE_PROVIDER_SITE_OTHER): Payer: Commercial Managed Care - HMO

## 2016-04-13 DIAGNOSIS — M2010 Hallux valgus (acquired), unspecified foot: Secondary | ICD-10-CM | POA: Diagnosis not present

## 2016-04-13 DIAGNOSIS — M779 Enthesopathy, unspecified: Secondary | ICD-10-CM | POA: Diagnosis not present

## 2016-04-13 MED ORDER — TRIAMCINOLONE ACETONIDE 10 MG/ML IJ SUSP
10.0000 mg | Freq: Once | INTRAMUSCULAR | Status: AC
  Administered 2016-04-13: 10 mg

## 2016-04-15 DIAGNOSIS — R252 Cramp and spasm: Secondary | ICD-10-CM | POA: Diagnosis not present

## 2016-04-15 NOTE — Progress Notes (Signed)
Subjective:     Patient ID: Mariah [name], female   DOB: 1960-07-02, 56 y.o.   MRN: [id number]  HPI patient states this bunion is been sore with fluid and I know on getting need to get it fixed someday but I want something temporarily   Review of Systems  All other systems reviewed and are negative.      Objective:   Physical Exam  Constitutional: She is oriented to person, place, and time.  Cardiovascular: Intact distal pulses.   Musculoskeletal: Normal range of motion.  Neurological: She is oriented to person, place, and time.  Skin: Skin is warm.  Nursing note and vitals reviewed.  neurovascular status found to be intact muscle strength was adequate with patient found to have an irritated inflamed left first metatarsal with fluid buildup and pain when palpated. 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 We'll get a try conservative and today injected around the joint 3 mg Kenalog 5 mill grams Xylocaine and gave instructions on wider shoes gave instructions on bunion and reviewed bunions with her. She wants to get this done and we will get this done as soon as we can  X-rays indicate structural deformity of the left over right foot with enlargement of the first metatarsal and elevated ankle of approximate 15 noted

## 2016-04-26 DIAGNOSIS — E782 Mixed hyperlipidemia: Secondary | ICD-10-CM | POA: Diagnosis not present

## 2016-04-26 DIAGNOSIS — Z0000 Encounter for general adult medical examination without abnormal findings: Secondary | ICD-10-CM | POA: Diagnosis not present

## 2016-04-26 DIAGNOSIS — E1165 Type 2 diabetes mellitus with hyperglycemia: Secondary | ICD-10-CM | POA: Diagnosis not present

## 2016-04-26 DIAGNOSIS — I10 Essential (primary) hypertension: Secondary | ICD-10-CM | POA: Diagnosis not present

## 2016-05-14 ENCOUNTER — Ambulatory Visit (INDEPENDENT_AMBULATORY_CARE_PROVIDER_SITE_OTHER): Payer: Commercial Managed Care - HMO | Admitting: Cardiology

## 2016-05-14 VITALS — BP 110/80 | HR 91 | Ht 61.0 in | Wt 165.4 lb

## 2016-05-14 DIAGNOSIS — G4762 Sleep related leg cramps: Secondary | ICD-10-CM

## 2016-05-14 DIAGNOSIS — G4733 Obstructive sleep apnea (adult) (pediatric): Secondary | ICD-10-CM

## 2016-05-14 DIAGNOSIS — I10 Essential (primary) hypertension: Secondary | ICD-10-CM

## 2016-05-14 DIAGNOSIS — Z8249 Family history of ischemic heart disease and other diseases of the circulatory system: Secondary | ICD-10-CM | POA: Diagnosis not present

## 2016-05-14 DIAGNOSIS — E669 Obesity, unspecified: Secondary | ICD-10-CM | POA: Diagnosis not present

## 2016-05-14 NOTE — Patient Instructions (Signed)
Medication Instructions:  Your physician recommends that you continue on your current medications as directed. Please refer to the Current Medication list given to you today.   Labwork: None  Testing/Procedures: Your physician has requested that you have a lower extremity arterial duplex. During this test, ultrasound is used to evaluate arterial blood flow in the legs. Allow one hour for this exam. There are no restrictions or special instructions.   Dr. Radford Pax recommends you have a NUCLEAR STRESS TEST.  Dr. Radford Pax recommends you have a CALCIUM SCORE. Please call back when you are ready to schedule.  Follow-Up: Your physician wants you to follow-up in: 1 year with Dr. Radford Pax. You will receive a reminder letter in the mail two months in advance. If you don't receive a letter, please call our office to schedule the follow-up appointment.   Any Other Special Instructions Will Be Listed Below (If Applicable).     If you need a refill on your cardiac medications before your next appointment, please call your pharmacy.

## 2016-05-14 NOTE — Progress Notes (Signed)
Cardiology Office Note    Date:  05/14/2016   ID:  Mariah [name], DOB 19-Aug-1960, MRN [id number]  PCP:  Mariah Dus, MD  Cardiologist:  Mariah Him, MD   Chief Complaint  Patient presents with  . Sleep Apnea  . Hypertension    History of Present Illness:  Mariah [name] is a 56 y.o. female with a history of OSA, HTN and obesity who presents today for followup. She is doing well. She tolerates her CPAP device well. She tolerates the full face mask and feels the pressure is adequate. She has  some nasal congestion. She feels rested when she gets up and has no daytime sleepiness.She occasionally exercises when she has the time. She has lost 20 lbs since I saw her last through exercise and diet. She also is concerned because her grandmother had an MI in her 37's and her mom had a DCM in her 86's but was an alcoholic.      Past Medical History:  Diagnosis Date  . Allergy   . Anemia   . Asthma   . Body odor    from colostomy takedown  . Cervicalgia   . Depression   . Diabetes mellitus without complication ([place])   . Fibroid   . GERD (gastroesophageal reflux disease)   . Hyperlipidemia   . Hypertension   . Hypothyroidism   . Infertility   . OSA (obstructive sleep apnea)    upper airway resistance syndrome on CPAP at 9cm H2O  . Reflux   . Vertigo     Past Surgical History:  Procedure Laterality Date  . ABDOMINAL SURGERY  2011   PERFORATED DIVERTICULUM  . COLON SURGERY  2012   sigmoid colectomy for perforated sigmoid diverticulitis  . COLOSTOMY     TEMPORARY  . COLOSTOMY TAKEDOWN    . TONSILLECTOMY    . VENTRAL HERNIA REPAIR  2012    Current Medications: Current Meds  Medication Sig  . ammonium lactate (LAC-HYDRIN) 12 % cream Apply topically as needed for dry skin.  Marland Kitchen aspirin 81 MG tablet Take 81 mg by mouth daily.  . Biotin 1000 MCG tablet Take 1,000 mcg by mouth 2 (two) times daily.   . Cholecalciferol (VITAMIN D) 2000 UNITS  tablet Take 2,000 Units by mouth 2 (two) times daily.   . clotrimazole-betamethasone (LOTRISONE) cream Apply 1 application topically 2 (two) times daily.  . DULoxetine (CYMBALTA) 30 MG capsule Take 1 capsule by mouth daily.  Marland Kitchen EPIPEN 2-PAK 0.3 MG/0.3ML DEVI Ad lib.  Marland Kitchen FARXIGA 10 MG TABS tablet Take 10 mg by mouth daily.  . ferrous sulfate 325 (65 FE) MG tablet Take 325 mg by mouth 2 (two) times daily with a meal.   . insulin regular (NOVOLIN R,HUMULIN R) 100 units/mL injection Inject 20 Units into the skin daily.  . Insulin Zinc Human (NOVOLIN L [place]) Inject 75 Units into the skin 3 (three) times daily. AS DIRECTED   . lisinopril (PRINIVIL,ZESTRIL) 10 MG tablet Take 1 tablet by mouth daily.  . pantoprazole (PROTONIX) 40 MG tablet Take 40 mg by mouth every morning.  . pravastatin (PRAVACHOL) 40 MG tablet Take 1 tablet by mouth daily.  Marland Kitchen PROAIR HFA 108 (90 BASE) MCG/ACT inhaler   . vitamin C (ASCORBIC ACID) 500 MG tablet Take 500 mg by mouth every other day.  . zolpidem (AMBIEN) 5 MG tablet     Allergies:   Codeine and Seasonal ic [cholestatin]   Social History   Social History  .  Marital status: Married    Spouse name: N/A  . Number of children: N/A  . Years of education: N/A   Social History Main Topics  . Smoking status: Former Smoker    Quit date: 11/19/1989  . Smokeless tobacco: Never Used  . Alcohol use No  . Drug use: No  . Sexual activity: Yes    Birth control/ protection: Post-menopausal   Other Topics Concern  . Not on file   Social History Narrative  . No narrative on file     Family History:  The patient's family history includes Breast cancer in her paternal grandmother; Cancer in her maternal grandfather; Diabetes in her father and maternal grandmother; Heart disease in her maternal grandmother; Hypertension in her father, maternal grandmother, and mother.   ROS:   Please see the history of present illness.    ROS All other systems reviewed and are  negative.  No flowsheet data found.     PHYSICAL EXAM:   VS:  BP 110/80 (BP Location: Left Arm, Patient Position: Sitting, Cuff Size: Normal)   Pulse 91   Ht 5' 1"  (1.549 m)   Wt 165 lb 6.4 oz (75 kg)   SpO2 98% Comment: at rest  BMI 31.25 kg/m    GEN: Well nourished, well developed, in no acute distress  HEENT: normal  Neck: no JVD, carotid bruits, or masses Cardiac: RRR; no murmurs, rubs, or gallops,no edema.  Intact distal pulses bilaterally.  Respiratory:  clear to auscultation bilaterally, normal work of breathing GI: soft, nontender, nondistended, + BS MS: no deformity or atrophy  Skin: warm and dry, no rash Neuro:  Alert and Oriented x 3, Strength and sensation are intact Psych: euthymic mood, full affect  Wt Readings from Last 3 Encounters:  05/14/16 165 lb 6.4 oz (75 kg)  12/25/15 173 lb (78.5 kg)  12/19/15 171 lb (77.6 kg)      Studies/Labs Reviewed:   EKG:  EKG is not ordered today.    Recent Labs: No results found for requested labs within last 8760 hours.   Lipid Panel    Component Value Date/Time   CHOL  02/07/2010 0432    72        ATP III CLASSIFICATION:  <200     mg/dL   Desirable  200-239  mg/dL   Borderline High  >=240    mg/dL   High          TRIG 47 02/07/2010 0432   HDL 36 (L) 02/07/2010 0432   CHOLHDL 2.0 02/07/2010 0432   VLDL 9 02/07/2010 0432   LDLCALC  02/07/2010 0432    27        Total Cholesterol/HDL:CHD Risk Coronary Heart Disease Risk Table                     Men   Women  1/2 Average Risk   3.4   3.3  Average Risk       5.0   4.4  2 X Average Risk   9.6   7.1  3 X Average Risk  23.4   11.0        Use the calculated Patient Ratio above and the CHD Risk Table to determine the patient's CHD Risk.        ATP III CLASSIFICATION (LDL):  <100     mg/dL   Optimal  100-129  mg/dL   Near or Above  Optimal  130-159  mg/dL   Borderline  160-189  mg/dL   High  >190     mg/dL   Very High    Additional  studies/ records that were reviewed today include:  CPAP download    ASSESSMENT:    1. OSA (obstructive sleep apnea)   2. Essential hypertension   3. Obesity (BMI 30-39.9)   4. Leg cramps, sleep related   5. Family history of early CAD      PLAN:  In order of problems listed above:  OSA - the patient is tolerating PAP therapy well without any problems.   The patient has been using and benefiting from CPAP use and will continue to benefit from therapy. I will get a download from her DME. HTN - BP controlled on current meds.  She will continue on ACE I. 3.   Obesity - She has lost 20lbs since the fall and I congratulated her.  She will continue with her diet and exercise program. 4.   Leg cramps - this occurs only at night and not with exertion. It usually occurs when she stretches her legs.  Labs were normal at her PCP .  Her pulses in her LE are reduced so I will get LE arterial dopplers to assess further but likely this is related to MSK etiology. 5.   Family history of CAD - she is very worried about her cardiac risk.  I will get a baseline stress myoview to rule out ischemia and chest CT calcium score to assess future risk. She wants to think about the Chest CT since it will cost her OOP.    Medication Adjustments/Labs and Tests Ordered: Current medicines are reviewed at length with the patient today.  Concerns regarding medicines are outlined above.  Medication changes, Labs and Tests ordered today are listed in the Patient Instructions below.  There are no Patient Instructions on file for this visit.   Signed, Mariah Him, MD  05/14/2016 8:15 AM    [facility] [place], [place], [place]  [address] Phone: [phone]; Fax: [phone]

## 2016-05-17 ENCOUNTER — Telehealth: Payer: Self-pay | Admitting: *Deleted

## 2016-05-17 NOTE — Telephone Encounter (Signed)
-----   Message from [name], RN sent at 05/14/2016  7:55 AM EDT ----- Regarding: Mariah [name] download Will you request a download from [place] please?  Thanks!

## 2016-05-17 NOTE — Telephone Encounter (Signed)
Called [facility] spoke to [place] and requested a download for the patient. Baker Janus states she will send in a request for a 30 day compliance report and she explained it can take 24 to 48 hours to fax over to [place].

## 2016-05-18 DIAGNOSIS — E1165 Type 2 diabetes mellitus with hyperglycemia: Secondary | ICD-10-CM | POA: Diagnosis not present

## 2016-05-18 DIAGNOSIS — I10 Essential (primary) hypertension: Secondary | ICD-10-CM | POA: Diagnosis not present

## 2016-05-18 DIAGNOSIS — Z794 Long term (current) use of insulin: Secondary | ICD-10-CM | POA: Diagnosis not present

## 2016-05-19 ENCOUNTER — Encounter (INDEPENDENT_AMBULATORY_CARE_PROVIDER_SITE_OTHER): Payer: Self-pay

## 2016-05-19 ENCOUNTER — Encounter: Payer: Self-pay | Admitting: Gynecology

## 2016-05-19 DIAGNOSIS — Z794 Long term (current) use of insulin: Secondary | ICD-10-CM | POA: Diagnosis not present

## 2016-05-19 DIAGNOSIS — E1165 Type 2 diabetes mellitus with hyperglycemia: Secondary | ICD-10-CM | POA: Diagnosis not present

## 2016-05-27 ENCOUNTER — Other Ambulatory Visit: Payer: Self-pay | Admitting: Cardiology

## 2016-05-27 DIAGNOSIS — G4762 Sleep related leg cramps: Secondary | ICD-10-CM

## 2016-06-01 ENCOUNTER — Encounter: Payer: Self-pay | Admitting: Cardiology

## 2016-06-01 NOTE — Telephone Encounter (Signed)
Spoke to [place] at [facility] concerning the download for patient since we have been waiting a while for the download to be faxed over.  Thayer Headings informed me that the patient rents the cpap for 13 months and the cpap is under warranty for 2 years. After 2 years [facility] no longer has access to the cpap so the patient has to take their cpap to their doctor to get a download.   I will reach out to the patient to get a download.

## 2016-06-03 ENCOUNTER — Ambulatory Visit (INDEPENDENT_AMBULATORY_CARE_PROVIDER_SITE_OTHER)
Admission: RE | Admit: 2016-06-03 | Discharge: 2016-06-03 | Disposition: A | Discharge status: Home / Self Care | Payer: Self-pay | Source: Ambulatory Visit | Attending: Cardiology | Admitting: Cardiology

## 2016-06-03 DIAGNOSIS — Z8249 Family history of ischemic heart disease and other diseases of the circulatory system: Secondary | ICD-10-CM

## 2016-06-08 NOTE — Telephone Encounter (Signed)
Spoke to patient and informed her that she is responsible for getting a download for [place] because [facility] no longer has access to her cpap. Patient states she had already sent the chip to [place] and now she has to wait for the chip to come back. Patient states she will call [facility] Wednesday morning 06/09/16 to see if they have received her chip and to ask them tom sent it back to her.

## 2016-06-09 ENCOUNTER — Telehealth (HOSPITAL_COMMUNITY): Payer: Self-pay | Admitting: *Deleted

## 2016-06-09 NOTE — Telephone Encounter (Signed)
Left message on voicemail per DPR in reference to upcoming appointment scheduled on 06/11/16 with detailed instructions given per Myocardial Perfusion Study Information Sheet for the test. LM to arrive 15 minutes early, and that it is imperative to arrive on time for appointment to keep from having the test rescheduled. If you need to cancel or reschedule your appointment, please call the office within 24 hours of your appointment. Failure to do so may result in a cancellation of your appointment, and a $50 no show fee. Phone number given for call back for any questions. Kirstie Peri

## 2016-06-11 ENCOUNTER — Ambulatory Visit (HOSPITAL_BASED_OUTPATIENT_CLINIC_OR_DEPARTMENT_OTHER): Payer: Commercial Managed Care - HMO

## 2016-06-11 ENCOUNTER — Ambulatory Visit (HOSPITAL_COMMUNITY)
Admission: RE | Admit: 2016-06-11 | Discharge: 2016-06-11 | Disposition: A | Discharge status: Home / Self Care | Payer: Commercial Managed Care - HMO | Source: Ambulatory Visit | Attending: Cardiology | Admitting: Cardiology

## 2016-06-11 ENCOUNTER — Encounter (HOSPITAL_COMMUNITY): Payer: Commercial Managed Care - HMO

## 2016-06-11 DIAGNOSIS — I10 Essential (primary) hypertension: Secondary | ICD-10-CM

## 2016-06-11 DIAGNOSIS — E1159 Type 2 diabetes mellitus with other circulatory complications: Secondary | ICD-10-CM | POA: Diagnosis not present

## 2016-06-11 DIAGNOSIS — G4733 Obstructive sleep apnea (adult) (pediatric): Secondary | ICD-10-CM | POA: Diagnosis not present

## 2016-06-11 DIAGNOSIS — E785 Hyperlipidemia, unspecified: Secondary | ICD-10-CM | POA: Diagnosis not present

## 2016-06-11 DIAGNOSIS — E109 Type 1 diabetes mellitus without complications: Secondary | ICD-10-CM | POA: Insufficient documentation

## 2016-06-11 DIAGNOSIS — Z8249 Family history of ischemic heart disease and other diseases of the circulatory system: Secondary | ICD-10-CM | POA: Diagnosis not present

## 2016-06-11 DIAGNOSIS — G4762 Sleep related leg cramps: Secondary | ICD-10-CM | POA: Diagnosis not present

## 2016-06-11 DIAGNOSIS — E669 Obesity, unspecified: Secondary | ICD-10-CM | POA: Diagnosis not present

## 2016-06-11 DIAGNOSIS — I2510 Atherosclerotic heart disease of native coronary artery without angina pectoris: Secondary | ICD-10-CM | POA: Diagnosis present

## 2016-06-11 LAB — MYOCARDIAL PERFUSION IMAGING
CHL CUP NUCLEAR SRS: 1
CHL CUP NUCLEAR SSS: 4
CSEPEDS: 30 s
CSEPEW: 7 METS
CSEPHR: 86 %
Exercise duration (min): 6 min
LV dias vol: 68 mL (ref 46–106)
LV sys vol: 25 mL
MPHR: 165 {beats}/min
NUC STRESS TID: 0.93
Peak HR: 142 {beats}/min
RATE: 0.34
Rest HR: 82 {beats}/min
SDS: 3

## 2016-06-11 MED ORDER — TECHNETIUM TC 99M TETROFOSMIN IV KIT
32.4000 | PACK | Freq: Once | INTRAVENOUS | Status: AC | PRN
  Administered 2016-06-11: 32.4 via INTRAVENOUS
  Filled 2016-06-11: qty 33

## 2016-06-11 MED ORDER — TECHNETIUM TC 99M TETROFOSMIN IV KIT
10.5000 | PACK | Freq: Once | INTRAVENOUS | Status: AC | PRN
  Administered 2016-06-11: 10.5 via INTRAVENOUS
  Filled 2016-06-11: qty 11

## 2016-06-14 NOTE — Telephone Encounter (Signed)
-----   Message from [name], RN sent at 06/14/2016  9:08 AM EDT ----- Regarding: [place]- I talked to this patient today and she said she got a new SD card and her DME should be sending a download soon. Just wanted to let you know so you could keep an eye out.  Thanks!

## 2016-06-14 NOTE — Telephone Encounter (Deleted)
-----   Message from [name], RN sent at 06/14/2016  9:08 AM EDT ----- Regarding: [place]- I talked to this patient today and she said she got a new SD card and her DME should be sending a download soon. Just wanted to let you know so you could keep an eye out.  Thanks!

## 2016-06-15 DIAGNOSIS — G4733 Obstructive sleep apnea (adult) (pediatric): Secondary | ICD-10-CM | POA: Diagnosis not present

## 2016-06-16 ENCOUNTER — Telehealth: Payer: Self-pay | Admitting: *Deleted

## 2016-06-16 NOTE — Telephone Encounter (Signed)
-----   Message from Sueanne Margarita, MD sent at 06/15/2016  3:09 PM EDT ----- Good AHI and compliance.  Continue current CPAP settings.

## 2016-06-16 NOTE — Telephone Encounter (Signed)
Informed patient of compliance results and patient understanding was verbalized. Patient understands her current settings will not change.

## 2016-06-16 NOTE — Telephone Encounter (Signed)
Download received and scanned.

## 2016-06-16 NOTE — Telephone Encounter (Signed)
Patient called to say she is taking a flight out of town for a week on June 28th and she wants to carry her cpap with her but she needs a letter from the doctor stating she needs to carry her cpap with her.

## 2016-06-17 NOTE — Telephone Encounter (Signed)
Please type up a letter for her

## 2016-06-18 ENCOUNTER — Encounter: Payer: Self-pay | Admitting: *Deleted

## 2016-06-18 NOTE — Telephone Encounter (Signed)
Letter is typed up and awaiting signature

## 2016-06-23 NOTE — Telephone Encounter (Signed)
Letter signed. Patient notified to pick up.

## 2016-08-17 DIAGNOSIS — E1165 Type 2 diabetes mellitus with hyperglycemia: Secondary | ICD-10-CM | POA: Diagnosis not present

## 2016-08-17 DIAGNOSIS — I10 Essential (primary) hypertension: Secondary | ICD-10-CM | POA: Diagnosis not present

## 2016-08-17 DIAGNOSIS — Z794 Long term (current) use of insulin: Secondary | ICD-10-CM | POA: Diagnosis not present

## 2016-09-15 DIAGNOSIS — G4733 Obstructive sleep apnea (adult) (pediatric): Secondary | ICD-10-CM | POA: Diagnosis not present

## 2016-09-19 DIAGNOSIS — S8002XA Contusion of left knee, initial encounter: Secondary | ICD-10-CM | POA: Diagnosis not present

## 2016-09-19 DIAGNOSIS — M25561 Pain in right knee: Secondary | ICD-10-CM | POA: Diagnosis not present

## 2016-09-21 DIAGNOSIS — S8001XA Contusion of right knee, initial encounter: Secondary | ICD-10-CM | POA: Diagnosis not present

## 2016-09-21 DIAGNOSIS — M1711 Unilateral primary osteoarthritis, right knee: Secondary | ICD-10-CM | POA: Diagnosis not present

## 2016-09-21 DIAGNOSIS — S5002XA Contusion of left elbow, initial encounter: Secondary | ICD-10-CM | POA: Diagnosis not present

## 2016-10-16 DIAGNOSIS — S5012XA Contusion of left forearm, initial encounter: Secondary | ICD-10-CM | POA: Diagnosis not present

## 2016-10-16 DIAGNOSIS — S5002XA Contusion of left elbow, initial encounter: Secondary | ICD-10-CM | POA: Diagnosis not present

## 2016-10-22 DIAGNOSIS — S8001XD Contusion of right knee, subsequent encounter: Secondary | ICD-10-CM | POA: Diagnosis not present

## 2016-10-22 DIAGNOSIS — M1711 Unilateral primary osteoarthritis, right knee: Secondary | ICD-10-CM | POA: Diagnosis not present

## 2016-10-22 DIAGNOSIS — S5002XD Contusion of left elbow, subsequent encounter: Secondary | ICD-10-CM | POA: Diagnosis not present

## 2016-11-02 DIAGNOSIS — Z23 Encounter for immunization: Secondary | ICD-10-CM | POA: Diagnosis not present

## 2016-11-02 DIAGNOSIS — E782 Mixed hyperlipidemia: Secondary | ICD-10-CM | POA: Diagnosis not present

## 2016-11-02 DIAGNOSIS — I10 Essential (primary) hypertension: Secondary | ICD-10-CM | POA: Diagnosis not present

## 2016-11-02 DIAGNOSIS — J4520 Mild intermittent asthma, uncomplicated: Secondary | ICD-10-CM | POA: Diagnosis not present

## 2016-11-03 DIAGNOSIS — E782 Mixed hyperlipidemia: Secondary | ICD-10-CM | POA: Diagnosis not present

## 2016-11-08 DIAGNOSIS — M1812 Unilateral primary osteoarthritis of first carpometacarpal joint, left hand: Secondary | ICD-10-CM | POA: Diagnosis not present

## 2016-11-08 DIAGNOSIS — M1811 Unilateral primary osteoarthritis of first carpometacarpal joint, right hand: Secondary | ICD-10-CM | POA: Diagnosis not present

## 2016-11-16 DIAGNOSIS — E1165 Type 2 diabetes mellitus with hyperglycemia: Secondary | ICD-10-CM | POA: Diagnosis not present

## 2016-11-16 DIAGNOSIS — Z794 Long term (current) use of insulin: Secondary | ICD-10-CM | POA: Diagnosis not present

## 2016-11-16 DIAGNOSIS — I10 Essential (primary) hypertension: Secondary | ICD-10-CM | POA: Diagnosis not present

## 2016-12-21 ENCOUNTER — Encounter: Payer: Self-pay | Admitting: Women's Health

## 2016-12-21 ENCOUNTER — Ambulatory Visit (INDEPENDENT_AMBULATORY_CARE_PROVIDER_SITE_OTHER): Payer: 59 | Admitting: Women's Health

## 2016-12-21 VITALS — BP 118/80 | Ht 61.0 in | Wt 180.0 lb

## 2016-12-21 DIAGNOSIS — B3731 Acute candidiasis of vulva and vagina: Secondary | ICD-10-CM

## 2016-12-21 DIAGNOSIS — Z01419 Encounter for gynecological examination (general) (routine) without abnormal findings: Secondary | ICD-10-CM | POA: Diagnosis not present

## 2016-12-21 DIAGNOSIS — B373 Candidiasis of vulva and vagina: Secondary | ICD-10-CM

## 2016-12-21 LAB — WET PREP FOR TRICH, YEAST, CLUE

## 2016-12-21 MED ORDER — FLUCONAZOLE 150 MG PO TABS: ORAL_TABLET | ORAL | 1 refills | Status: DC

## 2016-12-21 NOTE — Patient Instructions (Signed)
Vaginal Yeast infection, Adult Vaginal yeast infection is a condition that causes soreness, swelling, and redness (inflammation) of the vagina. It also causes vaginal discharge. This is a common condition. Some women get this infection frequently. What are the causes? This condition is caused by a change in the normal balance of the yeast (candida) and bacteria that live in the vagina. This change causes an overgrowth of yeast, which causes the inflammation. What increases the risk? This condition is more likely to develop in:  Women who take antibiotic medicines.  Women who have diabetes.  Women who take birth control pills.  Women who are pregnant.  Women who douche often.  Women who have a weak defense (immune) system.  Women who have been taking steroid medicines for a long time.  Women who frequently wear tight clothing.  What are the signs or symptoms? Symptoms of this condition include:  White, thick vaginal discharge.  Swelling, itching, redness, and irritation of the vagina. The lips of the vagina (vulva) may be affected as well.  Pain or a burning feeling while urinating.  Pain during sex.  How is this diagnosed? This condition is diagnosed with a medical history and physical exam. This will include a pelvic exam. Your health care provider will examine a sample of your vaginal discharge under a microscope. Your health care provider may send this sample for testing to confirm the diagnosis. How is this treated? This condition is treated with medicine. Medicines may be over-the-counter or prescription. You may be told to use one or more of the following:  Medicine that is taken orally.  Medicine that is applied as a cream.  Medicine that is inserted directly into the vagina (suppository).  Follow these instructions at home:  Take or apply over-the-counter and prescription medicines only as told by your health care provider.  Do not have sex until your health  care provider has approved. Tell your sex partner that you have a yeast infection. That person should go to his or her health care provider if he or she develops symptoms.  Do not wear tight clothes, such as pantyhose or tight pants.  Avoid using tampons until your health care provider approves.  Eat more yogurt. This may help to keep your yeast infection from returning.  Try taking a sitz bath to help with discomfort. This is a warm water bath that is taken while you are sitting down. The water should only come up to your hips and should cover your buttocks. Do this 3-4 times per day or as told by your health care provider.  Do not douche.  Wear breathable, cotton underwear.  If you have diabetes, keep your blood sugar levels under control. Contact a health care provider if:  You have a fever.  Your symptoms go away and then return.  Your symptoms do not get better with treatment.  Your symptoms get worse.  You have new symptoms.  You develop blisters in or around your vagina.  You have blood coming from your vagina and it is not your menstrual period.  You develop pain in your abdomen. This information is not intended to replace advice given to you by your health care provider. Make sure you discuss any questions you have with your health care provider. Document Released: 09/30/2004 Document Revised: 06/04/2015 Document Reviewed: 06/24/2014 Elsevier Interactive Patient Education  2018 [employer]. Carbohydrate Counting for Diabetes Mellitus, Adult Carbohydrate counting is a method for keeping track of how many carbohydrates you eat. Eating  carbohydrates naturally increases the amount of sugar (glucose) in the blood. Counting how many carbohydrates you eat helps keep your blood glucose within normal limits, which helps you manage your diabetes (diabetes mellitus). It is important to know how many carbohydrates you can safely have in each meal. This is different for every  person. A diet and nutrition specialist (registered dietitian) can help you make a meal plan and calculate how many carbohydrates you should have at each meal and snack. Carbohydrates are found in the following foods:  Grains, such as breads and cereals.  Dried beans and soy products.  Starchy vegetables, such as potatoes, peas, and corn.  Fruit and fruit juices.  Milk and yogurt.  Sweets and snack foods, such as cake, cookies, candy, chips, and soft drinks.  How do I count carbohydrates? There are two ways to count carbohydrates in food. You can use either of the methods or a combination of both. Reading "Nutrition Facts" on packaged food The "Nutrition Facts" list is included on the labels of almost all packaged foods and beverages in the [place] It includes:  The serving size.  Information about nutrients in each serving, including the grams (g) of carbohydrate per serving.  To use the "Nutrition Facts":  Decide how many servings you will have.  Multiply the number of servings by the number of carbohydrates per serving.  The resulting number is the total amount of carbohydrates that you will be having.  Learning standard serving sizes of other foods When you eat foods containing carbohydrates that are not packaged or do not include "Nutrition Facts" on the label, you need to measure the servings in order to count the amount of carbohydrates:  Measure the foods that you will eat with a food scale or measuring cup, if needed.  Decide how many standard-size servings you will eat.  Multiply the number of servings by 15. Most carbohydrate-rich foods have about 15 g of carbohydrates per serving. ? For example, if you eat 8 oz (170 g) of strawberries, you will have eaten 2 servings and 30 g of carbohydrates (2 servings x 15 g = 30 g).  For foods that have more than one food mixed, such as soups and casseroles, you must count the carbohydrates in each food that is included.  The  following list contains standard serving sizes of common carbohydrate-rich foods. Each of these servings has about 15 g of carbohydrates:   hamburger bun or  English muffin.   oz (15 mL) syrup.   oz (14 g) jelly.  1 slice of bread.  1 six-inch tortilla.  3 oz (85 g) cooked rice or pasta.  4 oz (113 g) cooked dried beans.  4 oz (113 g) starchy vegetable, such as peas, corn, or potatoes.  4 oz (113 g) hot cereal.  4 oz (113 g) mashed potatoes or  of a large baked potato.  4 oz (113 g) canned or frozen fruit.  4 oz (120 mL) fruit juice.  4-6 crackers.  6 chicken nuggets.  6 oz (170 g) unsweetened dry cereal.  6 oz (170 g) plain fat-free yogurt or yogurt sweetened with artificial sweeteners.  8 oz (240 mL) milk.  8 oz (170 g) fresh fruit or one small piece of fruit.  24 oz (680 g) popped popcorn.  Example of carbohydrate counting Sample meal  3 oz (85 g) chicken breast.  6 oz (170 g) brown rice.  4 oz (113 g) corn.  8 oz (240 mL) milk.  8 oz (170 g) strawberries with sugar-free whipped topping. Carbohydrate calculation 1. Identify the foods that contain carbohydrates: ? Rice. ? Corn. ? Milk. ? Strawberries. 2. Calculate how many servings you have of each food: ? 2 servings rice. ? 1 serving corn. ? 1 serving milk. ? 1 serving strawberries. 3. Multiply each number of servings by 15 g: ? 2 servings rice x 15 g = 30 g. ? 1 serving corn x 15 g = 15 g. ? 1 serving milk x 15 g = 15 g. ? 1 serving strawberries x 15 g = 15 g. 4. Add together all of the amounts to find the total grams of carbohydrates eaten: ? 30 g + 15 g + 15 g + 15 g = 75 g of carbohydrates total. This information is not intended to replace advice given to you by your health care provider. Make sure you discuss any questions you have with your health care provider. Document Released: 12/21/2004 Document Revised: 07/11/2015 Document Reviewed: 06/04/2015 Elsevier Interactive Patient  Education  [employer].

## 2016-12-22 NOTE — Progress Notes (Signed)
Mariah [name] 06/01/1960 [id number]    History:    Presents for annual exam. Postmenopausal on no HRT with no bleeding. Normal Pap and mammogram history. Diabetes on insulin seeing a new endocrinologist at [facility]. Hypertension, hypothyroidism and hypercholesterolemia managed by primary care. 2012 ruptured diverticuli. 2014 normal DEXA. 2017 negative colonoscopy.   Past medical history, past surgical history, family history and social history were all reviewed and documented in the EPIC chart. Works at [employer]. [name] 20 at a [place] doing well.  ROS:  A ROS was performed and pertinent positives and negatives are included.  Exam:  Vitals:   12/21/16 1524  BP: 118/80  Weight: 180 lb (81.6 kg)  Height: 5' 1"  (1.549 m)   Body mass index is 34.01 kg/m.   General appearance:  Normal Thyroid:  Symmetrical, normal in size, without palpable masses or nodularity. Respiratory  Auscultation:  Clear without wheezing or rhonchi Cardiovascular  Auscultation:  Regular rate, without rubs, murmurs or gallops  Edema/varicosities:  Not grossly evident Abdominal : lower right abdominal  wall larger than left side since diverticuli surgery.  Soft,nontender, without masses, guarding or rebound.  Liver/spleen:  No organomegaly noted  Hernia:  None appreciated  Skin  Inspection:  Grossly normal   Breasts: Examined lying and sitting.     Right: Without masses, retractions, discharge or axillary adenopathy.     Left: Without masses, retractions, discharge or axillary adenopathy. Gentitourinary   Inguinal/mons:  Normal without inguinal adenopathy  External genitalia:  Erythematous area at clitoral hood   BUS/Urethra/Skene's glands:  Normal  Vagina:  Erythematous, wet prep negative  Cervix:  Normal  Uterus:   normal in size, shape and contour.  Midline and mobile  Adnexa/parametria:     Rt: Without masses or tenderness.   Lt: Without masses or  tenderness.  Anus and perineum: Normal  Digital rectal exam: Normal sphincter tone without palpated masses or tenderness  Assessment/Plan:  56 y.o. MBF G0 for annual exam with mild vaginal itching  Postmenopausal/no HRT/no bleeding Diabetes on insulin endocrinologist managing Hypertension, hypothyroidism and hypercholesterolemia-primary care manages labs and meds Obesity  Plan: Reviewed wet prep negative, vaginal walls erythematous Diflucan 150 by mouth 1 dose, yeast prevention discussed, reviewed importance of glucose management in relationship to yeast. Continue management with dietitian, reviewed importance of low carbohydrate diet. SBE's, continue annual screening mammograms, calcium rich diet, vitamin D 2000 daily encouraged. Pap normal 2016, new screening guidelines reviewed.     [name] [facility], 7:05 AM 12/22/2016

## 2017-02-16 DIAGNOSIS — H40023 Open angle with borderline findings, high risk, bilateral: Secondary | ICD-10-CM | POA: Diagnosis not present

## 2017-02-18 DIAGNOSIS — M13842 Other specified arthritis, left hand: Secondary | ICD-10-CM | POA: Diagnosis not present

## 2017-02-18 DIAGNOSIS — M13841 Other specified arthritis, right hand: Secondary | ICD-10-CM | POA: Diagnosis not present

## 2017-02-18 DIAGNOSIS — G5601 Carpal tunnel syndrome, right upper limb: Secondary | ICD-10-CM | POA: Diagnosis not present

## 2017-02-20 DIAGNOSIS — G4733 Obstructive sleep apnea (adult) (pediatric): Secondary | ICD-10-CM | POA: Diagnosis not present

## 2017-02-22 DIAGNOSIS — E1165 Type 2 diabetes mellitus with hyperglycemia: Secondary | ICD-10-CM | POA: Diagnosis not present

## 2017-02-22 DIAGNOSIS — I10 Essential (primary) hypertension: Secondary | ICD-10-CM | POA: Diagnosis not present

## 2017-02-22 DIAGNOSIS — Z794 Long term (current) use of insulin: Secondary | ICD-10-CM | POA: Diagnosis not present

## 2017-02-28 DIAGNOSIS — G5601 Carpal tunnel syndrome, right upper limb: Secondary | ICD-10-CM | POA: Diagnosis not present

## 2017-03-12 DIAGNOSIS — R6889 Other general symptoms and signs: Secondary | ICD-10-CM | POA: Diagnosis not present

## 2017-04-18 DIAGNOSIS — G5601 Carpal tunnel syndrome, right upper limb: Secondary | ICD-10-CM | POA: Diagnosis not present

## 2017-05-02 DIAGNOSIS — Z23 Encounter for immunization: Secondary | ICD-10-CM | POA: Diagnosis not present

## 2017-05-02 DIAGNOSIS — E782 Mixed hyperlipidemia: Secondary | ICD-10-CM | POA: Diagnosis not present

## 2017-05-02 DIAGNOSIS — I10 Essential (primary) hypertension: Secondary | ICD-10-CM | POA: Diagnosis not present

## 2017-05-02 DIAGNOSIS — Z111 Encounter for screening for respiratory tuberculosis: Secondary | ICD-10-CM | POA: Diagnosis not present

## 2017-05-02 DIAGNOSIS — G5601 Carpal tunnel syndrome, right upper limb: Secondary | ICD-10-CM | POA: Diagnosis not present

## 2017-05-02 DIAGNOSIS — Z0000 Encounter for general adult medical examination without abnormal findings: Secondary | ICD-10-CM | POA: Diagnosis not present

## 2017-05-02 DIAGNOSIS — K219 Gastro-esophageal reflux disease without esophagitis: Secondary | ICD-10-CM | POA: Diagnosis not present

## 2017-05-12 ENCOUNTER — Ambulatory Visit (INDEPENDENT_AMBULATORY_CARE_PROVIDER_SITE_OTHER): Payer: 59

## 2017-05-12 ENCOUNTER — Other Ambulatory Visit: Payer: Self-pay | Admitting: Podiatry

## 2017-05-12 ENCOUNTER — Encounter: Payer: Self-pay | Admitting: Podiatry

## 2017-05-12 ENCOUNTER — Ambulatory Visit: Payer: 59 | Admitting: Podiatry

## 2017-05-12 DIAGNOSIS — M21619 Bunion of unspecified foot: Secondary | ICD-10-CM

## 2017-05-12 DIAGNOSIS — M779 Enthesopathy, unspecified: Secondary | ICD-10-CM

## 2017-05-12 DIAGNOSIS — M79671 Pain in right foot: Secondary | ICD-10-CM

## 2017-05-13 NOTE — Progress Notes (Signed)
Subjective:   Patient ID: Mariah [name], female   DOB: 57 y.o.   MRN: [id number]   HPI Patient presents doing pretty well but does want a diabetic foot exam is also concerned about discomfort in the outside of the right foot   ROS      Objective:  Physical Exam  Neurovascular status found to be intact muscle strength is adequate range of motion within normal limits.  I did not note any neurological loss currently she does have dry feet and she has mild inflammation around the fifth metatarsal base of the right foot     Assessment:  Low-grade tendinitis right with dermatitis right foot condition with no indication of diabetic pathology     Plan:  H&P discussed condition discussed ice therapy using basis for the outside of the right shoe gear modifications and if symptoms were to worsen will need to consider medication.  She will use over-the-counter medicines for dry skin and will be seen back to recheck again in the next 4 weeks as needed  X-ray right indicates that there is no indications of bone pathology or other structural pathology

## 2017-05-24 DIAGNOSIS — Z794 Long term (current) use of insulin: Secondary | ICD-10-CM | POA: Diagnosis not present

## 2017-05-24 DIAGNOSIS — E119 Type 2 diabetes mellitus without complications: Secondary | ICD-10-CM | POA: Diagnosis not present

## 2017-05-24 DIAGNOSIS — I10 Essential (primary) hypertension: Secondary | ICD-10-CM | POA: Diagnosis not present

## 2017-05-24 DIAGNOSIS — E1165 Type 2 diabetes mellitus with hyperglycemia: Secondary | ICD-10-CM | POA: Diagnosis not present

## 2017-07-18 ENCOUNTER — Encounter: Payer: Self-pay | Admitting: Cardiology

## 2017-07-19 ENCOUNTER — Ambulatory Visit: Payer: Commercial Managed Care - HMO | Admitting: Cardiology

## 2017-07-21 ENCOUNTER — Ambulatory Visit: Payer: 59 | Admitting: Cardiology

## 2017-07-21 ENCOUNTER — Telehealth: Payer: Self-pay | Admitting: Cardiology

## 2017-07-21 ENCOUNTER — Encounter: Payer: Self-pay | Admitting: Cardiology

## 2017-07-21 VITALS — BP 130/82 | HR 92 | Ht 61.0 in | Wt 193.0 lb

## 2017-07-21 DIAGNOSIS — G4733 Obstructive sleep apnea (adult) (pediatric): Secondary | ICD-10-CM | POA: Diagnosis not present

## 2017-07-21 DIAGNOSIS — E669 Obesity, unspecified: Secondary | ICD-10-CM

## 2017-07-21 DIAGNOSIS — I10 Essential (primary) hypertension: Secondary | ICD-10-CM | POA: Diagnosis not present

## 2017-07-21 NOTE — Patient Instructions (Signed)
Medication Instructions:  Your physician recommends that you continue on your current medications as directed. Please refer to the Current Medication list given to you today.   Labwork: None  Testing/Procedures: None  Follow-Up: 10 Weeks with Dr. Radford Pax   If you need a refill on your cardiac medications before your next appointment, please call your pharmacy.

## 2017-07-21 NOTE — Telephone Encounter (Signed)
New Message    1) What problem are you experiencing? none  2) Who is your medical equipment company?  Please send order for new cpap to [facility] if possible    Please route to the sleep study assistant.

## 2017-07-21 NOTE — Progress Notes (Signed)
Cardiology Office Note:    Date:  07/21/2017   ID:  Mariah [name], DOB [birth year], MRN [id number]  PCP:  Maury Dus, MD  Cardiologist:  No primary care provider on file.    Referring MD: Maury Dus, MD   Chief Complaint  Patient presents with  . Sleep Apnea  . Hypertension    History of Present Illness:    Mariah [name] is a 57 y.o. female with a hx of OSA on PAP, HTN and obesity.  She is doing well with her CPAP device.  She tolerates the mask and feels the pressure is adequate.  Since going on CPAP she feels rested in the am and has no significant daytime sleepiness.  She denies any significant mouth or nasal dryness or nasal congestion.  She does not think that she snores.  Yesterday she was very late to her doctor's appointment and was rushing around it is been very stressed and had some mild chest discomfort.  This is identical to what she has had in the past at which time a work-up was done and stress test was normal.  She says it is not changed any in frequency or severity and only comes on when she gets emotionally stressed.  She never gets any exertional discomfort.   Past Medical History:  Diagnosis Date  . Allergy   . Anemia   . Asthma   . Body odor    from colostomy takedown  . Cervicalgia   . Depression   . Diabetes mellitus without complication ([place])   . Fibroid   . GERD (gastroesophageal reflux disease)   . Hyperlipidemia   . Hypertension   . Hypothyroidism   . Infertility   . OSA (obstructive sleep apnea)    upper airway resistance syndrome on CPAP at 9cm H2O  . Reflux   . Vertigo     Past Surgical History:  Procedure Laterality Date  . ABDOMINAL SURGERY  2011   PERFORATED DIVERTICULUM  . COLON SURGERY  2012   sigmoid colectomy for perforated sigmoid diverticulitis  . COLOSTOMY     TEMPORARY  . COLOSTOMY TAKEDOWN    . TONSILLECTOMY    . VENTRAL HERNIA REPAIR  2012    Current Medications: Current Meds  Medication  Sig  . ammonium lactate (LAC-HYDRIN) 12 % cream Apply topically as needed for dry skin.  Marland Kitchen aspirin 81 MG tablet Take 81 mg by mouth daily.  . Aspirin-Calcium Carbonate 81-777 MG TABS Take by mouth.  . Biotin 1000 MCG tablet Take 1,000 mcg by mouth 2 (two) times daily.   . cephALEXin (KEFLEX) 500 MG capsule TAKE 1 CAPSULE BY MOUTH EVERY 6 HOURS FOR 10 DAYS  . Cholecalciferol (VITAMIN D) 2000 UNITS tablet Take 2,000 Units by mouth 2 (two) times daily.   . clotrimazole-betamethasone (LOTRISONE) cream Apply 1 application topically 2 (two) times daily.  . Continuous Blood Gluc Receiver (FREESTYLE LIBRE 14 DAY READER) DEVI 1 Device by Misc.(Non-Drug; Combo Route) route continuous.  . Continuous Blood Gluc Sensor (FREESTYLE LIBRE 14 DAY SENSOR) MISC FreeStyle Libre 14 Day Sensor kit  PLACE 1 PATCH EVERY 14 DAYS TO USE WITH READER  . Diclofenac Sodium (PENNSAID) 2 % SOLN Pennsaid 20 mg/gram/actuation (2 %) topical soln in metered-dose pump  apply ONE TO TWO pumps topically TO affected AREAS TWICE A DAY  . DULoxetine (CYMBALTA) 30 MG capsule Take 1 capsule by mouth daily.  . empagliflozin (JARDIANCE) 25 MG TABS tablet Jardiance 25 mg tablet  . EPIPEN  2-PAK 0.3 MG/0.3ML DEVI Ad lib.  . ferrous sulfate 325 (65 FE) MG tablet Take 325 mg by mouth 2 (two) times daily with a meal.   . fluconazole (DIFLUCAN) 150 MG tablet Take one today repeat in 3 days  . fluticasone (FLONASE) 50 MCG/ACT nasal spray Flonase Allergy Relief 50 mcg/actuation nasal spray,suspension  Spray 1 spray every day by intranasal route as directed for 10 days.  Marland Kitchen glycopyrrolate (ROBINUL) 1 MG tablet Take by mouth.  . hydrocortisone cream 0.5 % hydrocortisone 2.5 % topical cream  . insulin aspart (NOVOLOG FLEXPEN) 100 UNIT/ML FlexPen Novolog Flexpen U-100 Insulin aspart 100 unit/mL (3 mL) subcutaneous  . Insulin Pen Needle (FIFTY50 PEN NEEDLES) 32G X 4 MM MISC Use to inject insulin three times daily  . insulin regular (NOVOLIN R,HUMULIN  R) 100 units/mL injection Inject 500 Units into the skin 3 (three) times daily before meals.  Marland Kitchen lisinopril (PRINIVIL,ZESTRIL) 10 MG tablet Take 1 tablet by mouth daily.  . metFORMIN (GLUCOPHAGE-XR) 500 MG 24 hr tablet Take 1,000 mg by mouth 2 (two) times daily.  . pantoprazole (PROTONIX) 40 MG tablet Take 40 mg by mouth every morning.  . pravastatin (PRAVACHOL) 40 MG tablet Take 1 tablet by mouth daily.  Marland Kitchen PROAIR HFA 108 (90 BASE) MCG/ACT inhaler   . TRULICITY 0.10 UV/2.5DG SOPN INJECT 0.75 MG INTO THE SKIN ONCE A WEEK.  . vitamin C (ASCORBIC ACID) 500 MG tablet Take 500 mg by mouth every other day.  . zolpidem (AMBIEN) 5 MG tablet      Allergies:   Codeine; Other; and Seasonal ic [cholestatin]   Social History   Socioeconomic History  . Marital status: Married    Spouse name: Not on file  . Number of children: Not on file  . Years of education: Not on file  . Highest education level: Not on file  Occupational History  . Not on file  Social Needs  . Financial resource strain: Not on file  . Food insecurity:    Worry: Not on file    Inability: Not on file  . Transportation needs:    Medical: Not on file    Non-medical: Not on file  Tobacco Use  . Smoking status: Former Smoker    Last attempt to quit: 11/19/1989    Years since quitting: 27.6  . Smokeless tobacco: Never Used  Substance and Sexual Activity  . Alcohol use: No  . Drug use: No  . Sexual activity: Yes    Birth control/protection: Post-menopausal  Lifestyle  . Physical activity:    Days per week: Not on file    Minutes per session: Not on file  . Stress: Not on file  Relationships  . Social connections:    Talks on phone: Not on file    Gets together: Not on file    Attends religious service: Not on file    Active member of club or organization: Not on file    Attends meetings of clubs or organizations: Not on file    Relationship status: Not on file  Other Topics Concern  . Not on file  Social History  Narrative  . Not on file     Family History: The patient's family history includes Breast cancer in her paternal grandmother; Cancer in her maternal grandfather; Diabetes in her father and maternal grandmother; Heart disease in her maternal grandmother; Hypertension in her father, maternal grandmother, and mother.  ROS:   Please see the history of present illness.  ROS  All other systems reviewed and negative.   EKGs/Labs/Other Studies Reviewed:    The following studies were reviewed today: PAP download  EKG:  EKG is not ordered today.    Recent Labs: No results found for requested labs within last 8760 hours.   Recent Lipid Panel    Component Value Date/Time   CHOL  02/07/2010 0432    72        ATP III CLASSIFICATION:  <200     mg/dL   Desirable  200-239  mg/dL   Borderline High  >=240    mg/dL   High          TRIG 47 02/07/2010 0432   HDL 36 (L) 02/07/2010 0432   CHOLHDL 2.0 02/07/2010 0432   VLDL 9 02/07/2010 0432   LDLCALC  02/07/2010 0432    27        Total Cholesterol/HDL:CHD Risk Coronary Heart Disease Risk Table                     Men   Women  1/2 Average Risk   3.4   3.3  Average Risk       5.0   4.4  2 X Average Risk   9.6   7.1  3 X Average Risk  23.4   11.0        Use the calculated Patient Ratio above and the CHD Risk Table to determine the patient's CHD Risk.        ATP III CLASSIFICATION (LDL):  <100     mg/dL   Optimal  100-129  mg/dL   Near or Above                    Optimal  130-159  mg/dL   Borderline  160-189  mg/dL   High  >190     mg/dL   Very High    Physical Exam:    VS:  BP 130/82 (BP Location: Left Arm, Patient Position: Sitting, Cuff Size: Large)   Pulse 92   Ht 5' 1"  (1.549 m)   Wt 193 lb (87.5 kg)   SpO2 99%   BMI 36.47 kg/m     Wt Readings from Last 3 Encounters:  07/21/17 193 lb (87.5 kg)  12/21/16 180 lb (81.6 kg)  05/14/16 165 lb 6.4 oz (75 kg)     GEN:  Well nourished, well developed in no acute  distress HEENT: Normal NECK: No JVD; No carotid bruits LYMPHATICS: No lymphadenopathy CARDIAC: RRR, no murmurs, rubs, gallops RESPIRATORY:  Clear to auscultation without rales, wheezing or rhonchi  ABDOMEN: Soft, non-tender, non-distended MUSCULOSKELETAL:  No edema; No deformity  SKIN: Warm and dry NEUROLOGIC:  Alert and oriented x 3 PSYCHIATRIC:  Normal affect   ASSESSMENT:    1. OSA (obstructive sleep apnea)   2. Essential hypertension   3. Obesity (BMI 30-39.9)    PLAN:    In order of problems listed above:  1.  OSA - the patient is tolerating PAP therapy well without any problems. The PAP download was reviewed today and showed an AHI of 2.1/hr on 9 cm H2O with 73% compliance in using more than 4 hours nightly.  The patient has been using and benefiting from PAP use and will continue to benefit from therapy.   2.  HTN -BP is well controlled on exam today.  She will continue on lisinopril 10 mg daily.  Creatinine was normal at 0.81 on 05/02/2017.  3.  Obesity - I have encouraged her to get into a routine exercise program and cut back on carbs and portions.    Medication Adjustments/Labs and Tests Ordered: Current medicines are reviewed at length with the patient today.  Concerns regarding medicines are outlined above.  No orders of the defined types were placed in this encounter.  No orders of the defined types were placed in this encounter.   Signed, Fransico Him, MD  07/21/2017 3:26 PM    [place]

## 2017-07-22 NOTE — Telephone Encounter (Addendum)
Per Dr Radford Pax, Order a new Resmed Cpap @ 9 cm H20 sent to [facility]

## 2017-07-27 DIAGNOSIS — H40023 Open angle with borderline findings, high risk, bilateral: Secondary | ICD-10-CM | POA: Diagnosis not present

## 2017-08-23 DIAGNOSIS — G5601 Carpal tunnel syndrome, right upper limb: Secondary | ICD-10-CM | POA: Diagnosis not present

## 2017-08-23 DIAGNOSIS — G5602 Carpal tunnel syndrome, left upper limb: Secondary | ICD-10-CM | POA: Diagnosis not present

## 2017-08-23 DIAGNOSIS — E1165 Type 2 diabetes mellitus with hyperglycemia: Secondary | ICD-10-CM | POA: Diagnosis not present

## 2017-08-23 DIAGNOSIS — Z794 Long term (current) use of insulin: Secondary | ICD-10-CM | POA: Diagnosis not present

## 2017-09-29 ENCOUNTER — Ambulatory Visit: Payer: 59 | Admitting: Cardiology

## 2017-09-29 ENCOUNTER — Encounter: Payer: Self-pay | Admitting: Cardiology

## 2017-09-29 VITALS — BP 126/70 | HR 107 | Ht 61.0 in | Wt 191.4 lb

## 2017-09-29 DIAGNOSIS — I10 Essential (primary) hypertension: Secondary | ICD-10-CM | POA: Diagnosis not present

## 2017-09-29 DIAGNOSIS — E669 Obesity, unspecified: Secondary | ICD-10-CM

## 2017-09-29 DIAGNOSIS — R000 Tachycardia, unspecified: Secondary | ICD-10-CM | POA: Diagnosis not present

## 2017-09-29 DIAGNOSIS — G4733 Obstructive sleep apnea (adult) (pediatric): Secondary | ICD-10-CM

## 2017-09-29 NOTE — Progress Notes (Signed)
Cardiology Office Note:    Date:  09/29/2017   ID:  Mariah [name], DOB 1960-03-20, MRN [id number]  PCP:  Maury Dus, MD  Cardiologist:  No primary care provider on file.    Referring MD: Maury Dus, MD   Chief Complaint  Patient presents with  . Sleep Apnea  . Hypertension    History of Present Illness:    Mariah [name] is a 57 y.o. female with a hx of OSA on PAP, HTN and obesity. She is doing well with her CPAP device.  She tolerates the mask and feels the pressure is adequate.  Since going on CPAP her feels rested in the am and has no significant daytime sleepiness.  She denies any significant mouth or nasal dryness or nasal congestion.  She does not think that he snores.     Past Medical History:  Diagnosis Date  . Allergy   . Anemia   . Asthma   . Body odor    from colostomy takedown  . Cervicalgia   . Depression   . Diabetes mellitus without complication ([place])   . Fibroid   . GERD (gastroesophageal reflux disease)   . Hyperlipidemia   . Hypertension   . Hypothyroidism   . Infertility   . OSA (obstructive sleep apnea)    upper airway resistance syndrome on CPAP at 9cm H2O  . Reflux   . Vertigo     Past Surgical History:  Procedure Laterality Date  . ABDOMINAL SURGERY  2011   PERFORATED DIVERTICULUM  . COLON SURGERY  2012   sigmoid colectomy for perforated sigmoid diverticulitis  . COLOSTOMY     TEMPORARY  . COLOSTOMY TAKEDOWN    . TONSILLECTOMY    . VENTRAL HERNIA REPAIR  2012    Current Medications: Current Meds  Medication Sig  . ammonium lactate (LAC-HYDRIN) 12 % cream Apply topically as needed for dry skin.  Marland Kitchen aspirin 81 MG tablet Take 81 mg by mouth daily.  . Biotin 1000 MCG tablet Take 1,000 mcg by mouth 2 (two) times daily.   . Cholecalciferol (VITAMIN D) 2000 UNITS tablet Take 2,000 Units by mouth 2 (two) times daily.   . clotrimazole-betamethasone (LOTRISONE) cream Apply 1 application topically 2 (two) times  daily.  . Continuous Blood Gluc Receiver (FREESTYLE LIBRE 14 DAY READER) DEVI 1 Device by Misc.(Non-Drug; Combo Route) route continuous.  . Continuous Blood Gluc Sensor (FREESTYLE LIBRE 14 DAY SENSOR) MISC FreeStyle Libre 14 Day Sensor kit  PLACE 1 PATCH EVERY 14 DAYS TO USE WITH READER  . Diclofenac Sodium (PENNSAID) 2 % SOLN Pennsaid 20 mg/gram/actuation (2 %) topical soln in metered-dose pump  apply ONE TO TWO pumps topically TO affected AREAS TWICE A DAY  . DULoxetine (CYMBALTA) 30 MG capsule Take 1 capsule by mouth daily.  . empagliflozin (JARDIANCE) 25 MG TABS tablet Jardiance 25 mg tablet  . EPIPEN 2-PAK 0.3 MG/0.3ML DEVI Ad lib.  . ferrous sulfate 325 (65 FE) MG tablet Take 325 mg by mouth 2 (two) times daily with a meal.   . fluticasone (FLONASE) 50 MCG/ACT nasal spray Flonase Allergy Relief 50 mcg/actuation nasal spray,suspension  Spray 1 spray every day by intranasal route as directed for 10 days.  Marland Kitchen glycopyrrolate (ROBINUL) 1 MG tablet Take by mouth.  . hydrocortisone 2.5 % cream Apply 1 application topically daily.  . Insulin Pen Needle (FIFTY50 PEN NEEDLES) 32G X 4 MM MISC Use to inject insulin three times daily  . insulin regular (NOVOLIN R,HUMULIN  R) 100 units/mL injection Inject 500 Units into the skin 3 (three) times daily before meals.  Marland Kitchen lisinopril (PRINIVIL,ZESTRIL) 10 MG tablet Take 1 tablet by mouth daily.  . metFORMIN (GLUCOPHAGE-XR) 500 MG 24 hr tablet Take 1,000 mg by mouth 2 (two) times daily.  . pantoprazole (PROTONIX) 40 MG tablet Take 40 mg by mouth every morning.  . pravastatin (PRAVACHOL) 40 MG tablet Take 1 tablet by mouth daily.  Marland Kitchen PROAIR HFA 108 (90 BASE) MCG/ACT inhaler   . TRULICITY 0.96 EA/5.4UJ SOPN INJECT 0.75 MG INTO THE SKIN ONCE A WEEK.  . vitamin C (ASCORBIC ACID) 500 MG tablet Take 500 mg by mouth every other day.  . zolpidem (AMBIEN) 5 MG tablet Take 5 mg by mouth as needed.      Allergies:   Codeine; Other; and Seasonal ic [cholestatin]    Social History   Socioeconomic History  . Marital status: Married    Spouse name: Not on file  . Number of children: Not on file  . Years of education: Not on file  . Highest education level: Not on file  Occupational History  . Not on file  Social Needs  . Financial resource strain: Not on file  . Food insecurity:    Worry: Not on file    Inability: Not on file  . Transportation needs:    Medical: Not on file    Non-medical: Not on file  Tobacco Use  . Smoking status: Former Smoker    Last attempt to quit: 11/19/1989    Years since quitting: 27.8  . Smokeless tobacco: Never Used  Substance and Sexual Activity  . Alcohol use: No  . Drug use: No  . Sexual activity: Yes    Birth control/protection: Post-menopausal  Lifestyle  . Physical activity:    Days per week: Not on file    Minutes per session: Not on file  . Stress: Not on file  Relationships  . Social connections:    Talks on phone: Not on file    Gets together: Not on file    Attends religious service: Not on file    Active member of club or organization: Not on file    Attends meetings of clubs or organizations: Not on file    Relationship status: Not on file  Other Topics Concern  . Not on file  Social History Narrative  . Not on file     Family History: The patient's family history includes Breast cancer in her paternal grandmother; Cancer in her maternal grandfather; Diabetes in her father and maternal grandmother; Heart disease in her maternal grandmother; Hypertension in her father, maternal grandmother, and mother.  ROS:   Please see the history of present illness.    ROS  All other systems reviewed and negative.   EKGs/Labs/Other Studies Reviewed:    The following studies were reviewed today: PAP download  EKG:  EKG is  ordered today and showed sinus tachycardia 102 bpm with no ST changes.  Recent Labs: No results found for requested labs within last 8760 hours.   Recent Lipid Panel     Component Value Date/Time   CHOL  02/07/2010 0432    72        ATP III CLASSIFICATION:  <200     mg/dL   Desirable  200-239  mg/dL   Borderline High  >=240    mg/dL   High          TRIG 47 02/07/2010 0432   HDL  36 (L) 02/07/2010 0432   CHOLHDL 2.0 02/07/2010 0432   VLDL 9 02/07/2010 0432   LDLCALC  02/07/2010 0432    27        Total Cholesterol/HDL:CHD Risk Coronary Heart Disease Risk Table                     Men   Women  1/2 Average Risk   3.4   3.3  Average Risk       5.0   4.4  2 X Average Risk   9.6   7.1  3 X Average Risk  23.4   11.0        Use the calculated Patient Ratio above and the CHD Risk Table to determine the patient's CHD Risk.        ATP III CLASSIFICATION (LDL):  <100     mg/dL   Optimal  100-129  mg/dL   Near or Above                    Optimal  130-159  mg/dL   Borderline  160-189  mg/dL   High  >190     mg/dL   Very High    Physical Exam:    VS:  BP 126/70   Pulse (!) 107   Ht 5' 1"  (1.549 m)   Wt 191 lb 6.4 oz (86.8 kg)   SpO2 98%   BMI 36.16 kg/m     Wt Readings from Last 3 Encounters:  09/29/17 191 lb 6.4 oz (86.8 kg)  07/21/17 193 lb (87.5 kg)  12/21/16 180 lb (81.6 kg)     GEN:  Well nourished, well developed in no acute distress HEENT: Normal NECK: No JVD; No carotid bruits LYMPHATICS: No lymphadenopathy CARDIAC: RRR, no murmurs, rubs, gallops RESPIRATORY:  Clear to auscultation without rales, wheezing or rhonchi  ABDOMEN: Soft, non-tender, non-distended MUSCULOSKELETAL:  No edema; No deformity  SKIN: Warm and dry NEUROLOGIC:  Alert and oriented x 3 PSYCHIATRIC:  Normal affect   ASSESSMENT:    1. OSA (obstructive sleep apnea)   2. Essential hypertension   3. Obesity (BMI 30-39.9)   4. Sinus tachycardia    PLAN:    In order of problems listed above:  1.  OSA - the patient is tolerating PAP therapy well without any problems. The PAP download was reviewed today and showed an AHI of 1.09/hr  with 97% compliance in  using more than 4 hours nightly.  The patient has been using and benefiting from PAP use and will continue to benefit from therapy.   2.  HTN -BP is well controlled on exam today.  She will continue on lisinopril 10 mg daily.  Creatinine was stable at 0.81 on 05/02/2017.  3.  Obesity - I have encouraged her to get into a routine exercise program and cut back on carbs and portions.   4.  Tachycardia -EKG shows sinus tachycardia 102 bpm.  I am going to get a 24-hour Holter monitor to evaluate average heart rate.   Medication Adjustments/Labs and Tests Ordered: Current medicines are reviewed at length with the patient today.  Concerns regarding medicines are outlined above.  No orders of the defined types were placed in this encounter.  No orders of the defined types were placed in this encounter.   Signed, Fransico Him, MD  09/29/2017 4:11 PM    [place]

## 2017-09-29 NOTE — Patient Instructions (Addendum)
Medication Instructions:  Your physician recommends that you continue on your current medications as directed. Please refer to the Current Medication list given to you today.  Testing/Procedures: Your physician has recommended that you wear a 24 Hr holter monitor. Holter monitors are medical devices that record the heart's electrical activity. Doctors most often use these monitors to diagnose arrhythmias. Arrhythmias are problems with the speed or rhythm of the heartbeat. The monitor is a small, portable device. You can wear one while you do your normal daily activities. This is usually used to diagnose what is causing palpitations/syncope (passing out).   Follow-Up: Your physician wants you to follow-up in: 1 year with Dr. Radford Pax. You will receive a reminder letter in the mail two months in advance. If you don't receive a letter, please call our office to schedule the follow-up appointment.  If you need a refill on your cardiac medications before your next appointment, please call your pharmacy.

## 2017-10-05 ENCOUNTER — Ambulatory Visit (INDEPENDENT_AMBULATORY_CARE_PROVIDER_SITE_OTHER): Payer: 59

## 2017-10-05 DIAGNOSIS — R000 Tachycardia, unspecified: Secondary | ICD-10-CM | POA: Diagnosis not present

## 2017-10-10 ENCOUNTER — Telehealth: Payer: Self-pay | Admitting: Cardiology

## 2017-10-10 DIAGNOSIS — R000 Tachycardia, unspecified: Secondary | ICD-10-CM

## 2017-10-10 NOTE — Telephone Encounter (Signed)
Spoke with the patient, she requested to have her EKG and Holter monitor results sent to PCP and have a letter made for having CPAP supplies on a plane. She expressed understanding about her Holter monitor results and accepted the TSH, scheduled tomorrow.     Notes recorded by Sueanne Margarita, MD on 10/10/2017 at 10:36 AM EDT Please let patient know that heart monitor showed no arrhythmias but heart rate borderline elevated. Please order a TSH.

## 2017-10-10 NOTE — Telephone Encounter (Signed)
° ° °  Patient requesting response to MyChart messages. Please call

## 2017-10-11 ENCOUNTER — Other Ambulatory Visit: Payer: 59

## 2017-10-11 DIAGNOSIS — R000 Tachycardia, unspecified: Secondary | ICD-10-CM

## 2017-10-11 LAB — TSH: TSH: 0.727 u[IU]/mL (ref 0.450–4.500)

## 2017-10-18 DIAGNOSIS — G5602 Carpal tunnel syndrome, left upper limb: Secondary | ICD-10-CM | POA: Diagnosis not present

## 2017-10-18 DIAGNOSIS — G5601 Carpal tunnel syndrome, right upper limb: Secondary | ICD-10-CM | POA: Diagnosis not present

## 2017-11-02 DIAGNOSIS — Z23 Encounter for immunization: Secondary | ICD-10-CM | POA: Diagnosis not present

## 2017-11-10 DIAGNOSIS — E782 Mixed hyperlipidemia: Secondary | ICD-10-CM | POA: Diagnosis not present

## 2017-11-10 DIAGNOSIS — I10 Essential (primary) hypertension: Secondary | ICD-10-CM | POA: Diagnosis not present

## 2017-11-10 DIAGNOSIS — E1165 Type 2 diabetes mellitus with hyperglycemia: Secondary | ICD-10-CM | POA: Diagnosis not present

## 2017-11-12 IMAGING — NM NM MISC PROCEDURE
6 series · 36 of 36 positions shown · non-contrast
Comparison: none

[Series 1: wbr_r-proj_st rest · 6.51mm/px · 6 of 64 frames shown]
[frame 6/64]
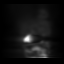
[frame 16/64]
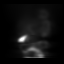
[frame 27/64]
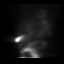
[frame 38/64]
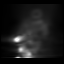
[frame 48/64]
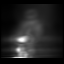
[frame 59/64]
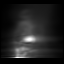

[Series 1: rest · 6.51mm/px · 6 of 64 frames shown]
[frame 6/64]
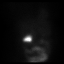
[frame 16/64]
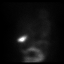
[frame 27/64]
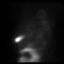
[frame 38/64]
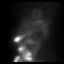
[frame 48/64]
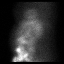
[frame 59/64]
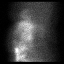

[Series 2: stress · 6.51mm/px · 6 of 512 frames shown (1 of 2)]
[frame 43/512]
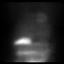
[frame 128/512]
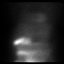
[frame 214/512]
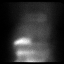
[frame 299/512]
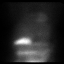
[frame 384/512]
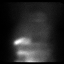
[frame 470/512]
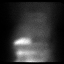

[Series 2: wbr_s-proj_st stress · 6.51mm/px · 6 of 64 frames shown (1 of 2)]
[frame 6/64]
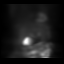
[frame 16/64]
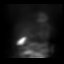
[frame 27/64]
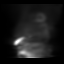
[frame 38/64]
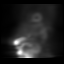
[frame 48/64]
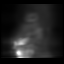
[frame 59/64]
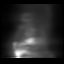

[Series 2: stress · 6.51mm/px · 6 of 64 frames shown (2 of 2)]
[frame 6/64]
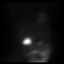
[frame 16/64]
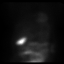
[frame 27/64]
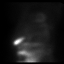
[frame 38/64]
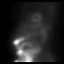
[frame 48/64]
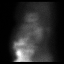
[frame 59/64]
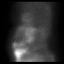

[Series 2: wbr_s-proj_st stress · 6.51mm/px · 6 of 512 frames shown (2 of 2)]
[frame 43/512]
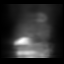
[frame 128/512]
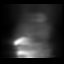
[frame 214/512]
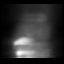
[frame 299/512]
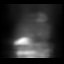
[frame 384/512]
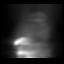
[frame 470/512]
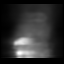

[36 of 36 positions shown; findings below may reference images not displayed]

Canned report from images found in remote index.

Refer to host system for actual result text.

## 2017-12-12 ENCOUNTER — Other Ambulatory Visit: Payer: Self-pay | Admitting: Women's Health

## 2017-12-12 DIAGNOSIS — Z1231 Encounter for screening mammogram for malignant neoplasm of breast: Secondary | ICD-10-CM

## 2017-12-22 ENCOUNTER — Encounter: Payer: Self-pay | Admitting: Women's Health

## 2018-01-03 ENCOUNTER — Encounter: Payer: 59 | Admitting: Women's Health

## 2018-01-31 ENCOUNTER — Encounter: Payer: Self-pay | Admitting: Women's Health

## 2018-01-31 ENCOUNTER — Ambulatory Visit (INDEPENDENT_AMBULATORY_CARE_PROVIDER_SITE_OTHER): Payer: Self-pay | Admitting: Women's Health

## 2018-01-31 VITALS — BP 116/72 | Ht 60.0 in | Wt 189.6 lb

## 2018-01-31 DIAGNOSIS — Z01419 Encounter for gynecological examination (general) (routine) without abnormal findings: Secondary | ICD-10-CM

## 2018-01-31 MED ORDER — FLUCONAZOLE 100 MG PO TABS: ORAL_TABLET | ORAL | 0 refills | Status: DC

## 2018-01-31 NOTE — Progress Notes (Signed)
Mariah [name] 1960/11/29 [id number]    History: 58 yo, MBF, G0P0 + 1 adopted presents for annual exam with no complaints. Post menopausal on no HRT with no bleeding. History of diabetes type 2 , hypertension, sleep apnea and asthma. Diabetes managed with metformin, Jardiance and Humulin by endocrinologist at [facility]. 2014 Dexa normal, will schedule repeat this year. 2017 negative colonoscopy,Normal mammogram and Pap history. Not sexually active for the past couple of years, married, husband prostate cancer.  Past medical history, past surgical history, family history and social history were all reviewed and documented in the EPIC chart. 57 year old adopted son [name], working at [employer]. Husband is 10 year Prostate Cancer free. 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   ROS:  A ROS was performed and pertinent positives and negatives are included.  Exam:  Vitals:   01/31/18 1216  BP: 116/72  Weight: 189 lb 9.6 oz (86 kg)  Height: 5' (1.524 m)   Body mass index is 37.03 kg/m.   General appearance:  Obese Thyroid:  Symmetrical, normal in size, without palpable masses or nodularity. Respiratory  Auscultation:  Clear without wheezing or rhonchi Cardiovascular  Auscultation:  Regular rate, without rubs, murmurs or gallops  Edema/varicosities:  Not grossly evident Abdominal   Soft,nontender, without masses, guarding or rebound.  Liver/spleen:  No organomegaly noted  Hernia: Right lower abdomen hernia nontender/abdomen obese  Skin  Inspection:  Grossly normal   Breasts: Examined lying and sitting.     Right: Without masses, retractions, discharge or axillary adenopathy.     Left: Without masses, retractions, discharge or axillary adenopathy. Gentitourinary   Inguinal/mons:  Normal without inguinal adenopathy  External genitalia:  Normal  BUS/Urethra/Skene's glands:  Normal  Vagina:  Normal  Cervix:   Normal  Uterus:  normal in size, shape and contour.  Midline and mobile  Adnexa/parametria:     Rt: Without masses or tenderness.   Lt: Without masses or tenderness.  Anus and perineum: Normal  Digital rectal exam: Normal sphincter tone without palpated masses or tenderness  Assessment/Plan:  58 y.o. G0 +1 adopted, MBF presents for annual exam with no complaints.  Postmenopausal on no HRT with no bleeding Obesity Hypertension/Diabetes/Asthma/Sleep Apnea lads and care managed by primary care Yeast vaginitis  Plan:Diflucan 100 mg 2 tablets today, #15 given to use as needed, history of recurrent yeast on Jardiance, prescribed, instructions and side effect discussed. Encouraged a low carb/low caloies diet and increase cardio exercise daily.  SBEs, continue annual mammograms, calcium rich diet, Vitamin D 2000 ius daily. Vaginal lubricants with intercourse. Instructed to schedule DEXA.  Normal Pap history will recheck next year, currently without insurance.     [place], 12:54 PM 01/31/2018

## 2018-01-31 NOTE — Patient Instructions (Signed)
Health Maintenance for Postmenopausal Women Menopause is a normal process in which your reproductive ability comes to an end. This process happens gradually over a span of months to years, usually between the ages of 62 and 89. Menopause is complete when you have missed 12 consecutive menstrual periods. It is important to talk with your health care provider about some of the most common conditions that affect postmenopausal women, such as heart disease, cancer, and bone loss (osteoporosis). Adopting a healthy lifestyle and getting preventive care can help to promote your health and wellness. Those actions can also lower your chances of developing some of these common conditions. What should I know about menopause? During menopause, you may experience a number of symptoms, such as:  Moderate-to-severe hot flashes.  Night sweats.  Decrease in sex drive.  Mood swings.  Headaches.  Tiredness.  Irritability.  Memory problems.  Insomnia. Choosing to treat or not to treat menopausal changes is an individual decision that you make with your health care provider. What should I know about hormone replacement therapy and supplements? Hormone therapy products are effective for treating symptoms that are associated with menopause, such as hot flashes and night sweats. Hormone replacement carries certain risks, especially as you become older. If you are thinking about using estrogen or estrogen with progestin treatments, discuss the benefits and risks with your health care provider. What should I know about heart disease and stroke? Heart disease, heart attack, and stroke become more likely as you age. This may be due, in part, to the hormonal changes that your body experiences during menopause. These can affect how your body processes dietary fats, triglycerides, and cholesterol. Heart attack and stroke are both medical emergencies. There are many things that you can do to help prevent heart disease  and stroke:  Have your blood pressure checked at least every 1-2 years. High blood pressure causes heart disease and increases the risk of stroke.  If you are 79-72 years old, ask your health care provider if you should take aspirin to prevent a heart attack or a stroke.  Do not use any tobacco products, including cigarettes, chewing tobacco, or electronic cigarettes. If you need help quitting, ask your health care provider.  It is important to eat a healthy diet and maintain a healthy weight. ? Be sure to include plenty of vegetables, fruits, low-fat dairy products, and lean protein. ? Avoid eating foods that are high in solid fats, added sugars, or salt (sodium).  Get regular exercise. This is one of the most important things that you can do for your health. ? Try to exercise for at least 150 minutes each week. The type of exercise that you do should increase your heart rate and make you sweat. This is known as moderate-intensity exercise. ? Try to do strengthening exercises at least twice each week. Do these in addition to the moderate-intensity exercise.  Know your numbers.Ask your health care provider to check your cholesterol and your blood glucose. Continue to have your blood tested as directed by your health care provider.  What should I know about cancer screening? There are several types of cancer. Take the following steps to reduce your risk and to catch any cancer development as early as possible. Breast Cancer  Practice breast self-awareness. ? This means understanding how your breasts normally appear and feel. ? It also means doing regular breast self-exams. Let your health care provider know about any changes, no matter how small.  If you are 40 or  older, have a clinician do a breast exam (clinical breast exam or CBE) every year. Depending on your age, family history, and medical history, it may be recommended that you also have a yearly breast X-ray (mammogram).  If you  have a family history of breast cancer, talk with your health care provider about genetic screening.  If you are at high risk for breast cancer, talk with your health care provider about having an MRI and a mammogram every year.  Breast cancer (BRCA) gene test is recommended for women who have family members with BRCA-related cancers. Results of the assessment will determine the need for genetic counseling and BRCA1 and for BRCA2 testing. BRCA-related cancers include these types: ? Breast. This occurs in males or females. ? Ovarian. ? Tubal. This may also be called fallopian tube cancer. ? Cancer of the abdominal or pelvic lining (peritoneal cancer). ? Prostate. ? Pancreatic. Cervical, Uterine, and Ovarian Cancer Your health care provider may recommend that you be screened regularly for cancer of the pelvic organs. These include your ovaries, uterus, and vagina. This screening involves a pelvic exam, which includes checking for microscopic changes to the surface of your cervix (Pap test).  For women ages 21-65, health care providers may recommend a pelvic exam and a Pap test every three years. For women ages 39-65, they may recommend the Pap test and pelvic exam, combined with testing for human papilloma virus (HPV), every five years. Some types of HPV increase your risk of cervical cancer. Testing for HPV may also be done on women of any age who have unclear Pap test results.  Other health care providers may not recommend any screening for nonpregnant women who are considered low risk for pelvic cancer and have no symptoms. Ask your health care provider if a screening pelvic exam is right for you.  If you have had past treatment for cervical cancer or a condition that could lead to cancer, you need Pap tests and screening for cancer for at least 20 years after your treatment. If Pap tests have been discontinued for you, your risk factors (such as having a new sexual partner) need to be reassessed  to determine if you should start having screenings again. Some women have medical problems that increase the chance of getting cervical cancer. In these cases, your health care provider may recommend that you have screening and Pap tests more often.  If you have a family history of uterine cancer or ovarian cancer, talk with your health care provider about genetic screening.  If you have vaginal bleeding after reaching menopause, tell your health care provider.  There are currently no reliable tests available to screen for ovarian cancer. Lung Cancer Lung cancer screening is recommended for adults 57-50 years old who are at high risk for lung cancer because of a history of smoking. A yearly low-dose CT scan of the lungs is recommended if you:  Currently smoke.  Have a history of at least 30 pack-years of smoking and you currently smoke or have quit within the past 15 years. A pack-year is smoking an average of one pack of cigarettes per day for one year. Yearly screening should:  Continue until it has been 15 years since you quit.  Stop if you develop a health problem that would prevent you from having lung cancer treatment. Colorectal Cancer  This type of cancer can be detected and can often be prevented.  Routine colorectal cancer screening usually begins at age 12 and continues through  age 75.  If you have risk factors for colon cancer, your health care provider may recommend that you be screened at an earlier age.  If you have a family history of colorectal cancer, talk with your health care provider about genetic screening.  Your health care provider may also recommend using home test kits to check for hidden blood in your stool.  A small camera at the end of a tube can be used to examine your colon directly (sigmoidoscopy or colonoscopy). This is done to check for the earliest forms of colorectal cancer.  Direct examination of the colon should be repeated every 5-10 years until  age 75. However, if early forms of precancerous polyps or small growths are found or if you have a family history or genetic risk for colorectal cancer, you may need to be screened more often. Skin Cancer  Check your skin from head to toe regularly.  Monitor any moles. Be sure to tell your health care provider: ? About any new moles or changes in moles, especially if there is a change in a mole's shape or color. ? If you have a mole that is larger than the size of a pencil eraser.  If any of your family members has a history of skin cancer, especially at a young age, talk with your health care provider about genetic screening.  Always use sunscreen. Apply sunscreen liberally and repeatedly throughout the day.  Whenever you are outside, protect yourself by wearing long sleeves, pants, a wide-brimmed hat, and sunglasses. What should I know about osteoporosis? Osteoporosis is a condition in which bone destruction happens more quickly than new bone creation. After menopause, you may be at an increased risk for osteoporosis. To help prevent osteoporosis or the bone fractures that can happen because of osteoporosis, the following is recommended:  If you are 19-50 years old, get at least 1,000 mg of calcium and at least 600 mg of vitamin D per day.  If you are older than age 50 but younger than age 70, get at least 1,200 mg of calcium and at least 600 mg of vitamin D per day.  If you are older than age 70, get at least 1,200 mg of calcium and at least 800 mg of vitamin D per day. Smoking and excessive alcohol intake increase the risk of osteoporosis. Eat foods that are rich in calcium and vitamin D, and do weight-bearing exercises several times each week as directed by your health care provider. What should I know about how menopause affects my mental health? Depression may occur at any age, but it is more common as you become older. Common symptoms of depression include:  Low or sad  mood.  Changes in sleep patterns.  Changes in appetite or eating patterns.  Feeling an overall lack of motivation or enjoyment of activities that you previously enjoyed.  Frequent crying spells. Talk with your health care provider if you think that you are experiencing depression. What should I know about immunizations? It is important that you get and maintain your immunizations. These include:  Tetanus, diphtheria, and pertussis (Tdap) booster vaccine.  Influenza every year before the flu season begins.  Pneumonia vaccine.  Shingles vaccine. Your health care provider may also recommend other immunizations. This information is not intended to replace advice given to you by your health care provider. Make sure you discuss any questions you have with your health care provider. Document Released: 02/12/2005 Document Revised: 07/11/2015 Document Reviewed: 09/24/2014 Elsevier Interactive Patient Education    2019 [place].

## 2018-10-16 ENCOUNTER — Telehealth: Payer: Self-pay | Admitting: *Deleted

## 2018-10-16 NOTE — Telephone Encounter (Signed)

## 2018-10-18 NOTE — Progress Notes (Signed)
Virtual Visit via Telephone Note   This visit type was conducted due to national recommendations for restrictions regarding the COVID-19 Pandemic (e.g. social distancing) in an effort to limit this patient's exposure and mitigate transmission in our community.  Due to her co-morbid illnesses, this patient is at least at moderate risk for complications without adequate follow up.  This format is felt to be most appropriate for this patient at this time.  All issues noted in this document were discussed and addressed.  A limited physical exam was performed with this format.  Please refer to the patient's chart for her consent to telehealth for [facility].   Evaluation Performed:  Follow-up visit  This visit type was conducted due to national recommendations for restrictions regarding the COVID-19 Pandemic (e.g. social distancing).  This format is felt to be most appropriate for this patient at this time.  All issues noted in this document were discussed and addressed.  No physical exam was performed (except for noted visual exam findings with Video Visits).  Please refer to the patient's chart (MyChart message for video visits and phone note for telephone visits) for the patient's consent to telehealth for [facility].  Date:  10/19/2018   ID:  Mariah [name], DOB 09-09-1960, MRN [id number]  Patient Location:  Home  Provider location:   [place]  PCP:  Maury Dus, MD  Sleep Medicine:  Fransico Him, MD Electrophysiologist:  None   Chief Complaint:  OSA  History of Present Illness:    Mariah [name] is a 58 y.o. female who presents via audio/video conferencing for a telehealth visit today.    Mariah [name] is a 58 y.o. female with a hx of OSA on PAP, HTN and obesity.  She is doing well with her CPAP device and thinks that she has gotten used to it.  She tolerates the mask and feels the pressure is adequate.  Since going on CPAP she feels rested  in the am and has no significant daytime sleepiness.  She denies any significant mouth or nasal dryness or nasal congestion.  She does not think that he snores.    The patient does not have symptoms concerning for COVID-19 infection (fever, chills, cough, or new shortness of breath).   Prior CV studies:   The following studies were reviewed today:  PAP compliance download  Past Medical History:  Diagnosis Date   Allergy    Anemia    Asthma    Body odor    from colostomy takedown   Cervicalgia    Depression    Diabetes mellitus without complication ([place])    Fibroid    GERD (gastroesophageal reflux disease)    Hyperlipidemia    Hypertension    Hypothyroidism    Infertility    OSA (obstructive sleep apnea)    upper airway resistance syndrome on CPAP at 9cm H2O   Reflux    Vertigo    Past Surgical History:  Procedure Laterality Date   ABDOMINAL SURGERY  2011   PERFORATED DIVERTICULUM   COLON SURGERY  2012   sigmoid colectomy for perforated sigmoid diverticulitis   COLOSTOMY     TEMPORARY   COLOSTOMY TAKEDOWN     TONSILLECTOMY     VENTRAL HERNIA REPAIR  2012     Current Meds  Medication Sig   ammonium lactate (LAC-HYDRIN) 12 % cream Apply topically as needed for dry skin.   amphetamine-dextroamphetamine (ADDERALL XR) 10 MG 24 hr capsule Take 10 mg by  mouth daily.   aspirin 81 MG tablet Take 81 mg by mouth daily.   Biotin 1000 MCG tablet Take 1,000 mcg by mouth 2 (two) times daily.    Cholecalciferol (VITAMIN D) 2000 UNITS tablet Take 2,000 Units by mouth 2 (two) times daily.    clotrimazole-betamethasone (LOTRISONE) cream Apply 1 application topically 2 (two) times daily.   Continuous Blood Gluc Receiver (FREESTYLE LIBRE 14 DAY READER) DEVI 1 Device by Misc.(Non-Drug; Combo Route) route continuous.   Continuous Blood Gluc Sensor (FREESTYLE LIBRE 14 DAY SENSOR) MISC FreeStyle Libre 14 Day Sensor kit  PLACE 1 PATCH EVERY 14 DAYS TO USE WITH  READER   Diclofenac Sodium (PENNSAID) 2 % SOLN Pennsaid 20 mg/gram/actuation (2 %) topical soln in metered-dose pump  apply ONE TO TWO pumps topically TO affected AREAS TWICE A DAY   DULoxetine (CYMBALTA) 30 MG capsule Take 1 capsule by mouth daily.   empagliflozin (JARDIANCE) 25 MG TABS tablet Jardiance 25 mg tablet   EPIPEN 2-PAK 0.3 MG/0.3ML DEVI Ad lib.   ferrous sulfate 325 (65 FE) MG tablet Take 325 mg by mouth 2 (two) times daily with a meal.    fluconazole (DIFLUCAN) 100 MG tablet Take 2 today and as needed   fluticasone (FLONASE) 50 MCG/ACT nasal spray Flonase Allergy Relief 50 mcg/actuation nasal spray,suspension  Spray 1 spray every day by intranasal route as directed for 10 days.   glycopyrrolate (ROBINUL) 1 MG tablet Take by mouth.   hydrocortisone 2.5 % cream Apply 1 application topically daily.   Insulin Pen Needle (FIFTY50 PEN NEEDLES) 32G X 4 MM MISC Use to inject insulin three times daily   insulin regular (NOVOLIN R,HUMULIN R) 100 units/mL injection Inject 500 Units into the skin 3 (three) times daily before meals. 120 units in morning and 70 units at night   lisinopril (PRINIVIL,ZESTRIL) 10 MG tablet Take 1 tablet by mouth daily.   metFORMIN (GLUCOPHAGE-XR) 500 MG 24 hr tablet Take 1,000 mg by mouth 2 (two) times daily.   pantoprazole (PROTONIX) 40 MG tablet Take 40 mg by mouth every morning.   pravastatin (PRAVACHOL) 40 MG tablet Take 1 tablet by mouth daily.   PROAIR HFA 108 (90 BASE) MCG/ACT inhaler Inhale 2 puffs into the lungs every 6 (six) hours as needed.    TRULICITY 7.28 AS/6.0RV SOPN INJECT 0.75 MG INTO THE SKIN ONCE A WEEK.   vitamin C (ASCORBIC ACID) 500 MG tablet Take 500 mg by mouth every other day.   zolpidem (AMBIEN) 5 MG tablet Take 5 mg by mouth as needed.      Allergies:   Codeine, Other, and Seasonal ic [cholestatin]   Social History   Tobacco Use   Smoking status: Former Smoker    Quit date: 11/19/1989    Years since  quitting: 28.9   Smokeless tobacco: Never Used  Substance Use Topics   Alcohol use: No   Drug use: No     Family Hx: The patient's family history includes Breast cancer in her paternal grandmother; Cancer in her maternal grandfather; Diabetes in her father and maternal grandmother; Heart disease in her maternal grandmother; Hypertension in her father, maternal grandmother, and mother.  ROS:   Please see the history of present illness.     All other systems reviewed and are negative.   Labs/Other Tests and Data Reviewed:    Recent Labs: No results found for requested labs within last 8760 hours.   Recent Lipid Panel Lab Results  Component Value Date/Time  CHOL  02/07/2010 04:32 AM    72        ATP III CLASSIFICATION:  <200     mg/dL   Desirable  200-239  mg/dL   Borderline High  >=240    mg/dL   High          TRIG 47 02/07/2010 04:32 AM   HDL 36 (L) 02/07/2010 04:32 AM   CHOLHDL 2.0 02/07/2010 04:32 AM   LDLCALC  02/07/2010 04:32 AM    27        Total Cholesterol/HDL:CHD Risk Coronary Heart Disease Risk Table                     Men   Women  1/2 Average Risk   3.4   3.3  Average Risk       5.0   4.4  2 X Average Risk   9.6   7.1  3 X Average Risk  23.4   11.0        Use the calculated Patient Ratio above and the CHD Risk Table to determine the patient's CHD Risk.        ATP III CLASSIFICATION (LDL):  <100     mg/dL   Optimal  100-129  mg/dL   Near or Above                    Optimal  130-159  mg/dL   Borderline  160-189  mg/dL   High  >190     mg/dL   Very High    Wt Readings from Last 3 Encounters:  10/19/18 192 lb 11.2 oz (87.4 kg)  01/31/18 189 lb 9.6 oz (86 kg)  09/29/17 191 lb 6.4 oz (86.8 kg)     Objective:    Vital Signs:  BP 124/81    Pulse (!) 111    Ht 5' (1.524 m)    Wt 192 lb 11.2 oz (87.4 kg)    BMI 37.63 kg/m     ASSESSMENT & PLAN:    1.  OSA - The patient is tolerating PAP therapy well without any problems. The PAP download was  reviewed today and showed an AHI of 1.5/hr on 9 cm H2O with 90% compliance in using more than 4 hours nightly.  The patient has been using and benefiting from PAP use and will continue to benefit from therapy.   2.  HTN -BP controlled -continue Lisinopril 6m daily  3.  Obesity  -I have encouraged her to get into a routine exercise program and cut back on carbs and portions.   4.  Sinus tachycardia -workup in the past was normal with average HR 101bpm -I will repeat Holter to make sure HR is still ok  COVID-19 Education: The signs and symptoms of COVID-19 were discussed with the patient and how to seek care for testing (follow up with PCP or arrange E-visit).  The importance of social distancing was discussed today.  Patient Risk:   After full review of this patient's clinical status, I feel that they are at least moderate risk at this time.  Time:   Today, I have spent 20 minutes directly with the patient on telemedciein discussing medical problems including OSA, obesity and HTN.  We also reviewed the symptoms of COVID 19 and the ways to protect against contracting the virus with telehealth technology.  I spent an additional 5 minutes reviewing patient's chart including PAP compliance download.  Medication Adjustments/Labs and Tests Ordered:  Current medicines are reviewed at length with the patient today.  Concerns regarding medicines are outlined above.  Tests Ordered: No orders of the defined types were placed in this encounter.  Medication Changes: No orders of the defined types were placed in this encounter.   Disposition:  Follow up in 1 year(s)  Signed, Fransico Him, MD  10/19/2018 8:25 AM    [facility]

## 2018-10-19 ENCOUNTER — Encounter: Payer: Self-pay | Admitting: Cardiology

## 2018-10-19 ENCOUNTER — Other Ambulatory Visit: Payer: Self-pay

## 2018-10-19 ENCOUNTER — Telehealth (INDEPENDENT_AMBULATORY_CARE_PROVIDER_SITE_OTHER): Payer: 59 | Admitting: Cardiology

## 2018-10-19 VITALS — BP 124/81 | HR 111 | Ht 60.0 in | Wt 192.7 lb

## 2018-10-19 DIAGNOSIS — R000 Tachycardia, unspecified: Secondary | ICD-10-CM

## 2018-10-19 DIAGNOSIS — I10 Essential (primary) hypertension: Secondary | ICD-10-CM

## 2018-10-19 DIAGNOSIS — G4733 Obstructive sleep apnea (adult) (pediatric): Secondary | ICD-10-CM

## 2018-10-19 DIAGNOSIS — E669 Obesity, unspecified: Secondary | ICD-10-CM

## 2018-10-19 NOTE — Patient Instructions (Addendum)
Medication Instructions:  Your physician recommends that you continue on your current medications as directed. Please refer to the Current Medication list given to you today.  If you need a refill on your cardiac medications before your next appointment, please call your pharmacy.   Lab work: None Ordered  If you have labs (blood work) drawn today and your tests are completely normal, you will receive your results only by: Marland Kitchen MyChart Message (if you have MyChart) OR . A paper copy in the mail If you have any lab test that is abnormal or we need to change your treatment, we will call you to review the results.  Testing/Procedures: Your physician has recommended that you wear a 3 day monitor. These monitors are medical devices that record the heart's electrical activity. Doctors most often use these monitors to diagnose arrhythmias. Arrhythmias are problems with the speed or rhythm of the heartbeat. The monitor is a small, portable device. You can wear one while you do your normal daily activities. This is usually used to diagnose what is causing palpitations/syncope (passing out).   Follow-Up: At [facility], you and your health needs are our priority.  As part of our continuing mission to provide you with exceptional heart care, we have created designated Provider Care Teams.  These Care Teams include your primary Cardiologist (physician) and Advanced Practice Providers (APPs -  Physician Assistants and Nurse Practitioners) who all work together to provide you with the care you need, when you need it. . You will need a follow up appointment in 1 year with Fransico Him, MD.  Please call our office 2 months in advance to schedule this appointment.    Any Other Special Instructions Will Be Listed Below (If Applicable).

## 2018-10-25 ENCOUNTER — Telehealth: Payer: Self-pay | Admitting: *Deleted

## 2018-10-25 NOTE — Telephone Encounter (Signed)
3 day ZIO XT long term holter monitor to be mailed to the patient home.  Instructions reviewed briefly as they are included in the monitor kit.

## 2018-10-30 ENCOUNTER — Ambulatory Visit (INDEPENDENT_AMBULATORY_CARE_PROVIDER_SITE_OTHER): Payer: 59

## 2018-10-30 DIAGNOSIS — R000 Tachycardia, unspecified: Secondary | ICD-10-CM | POA: Diagnosis not present

## 2018-11-14 ENCOUNTER — Telehealth: Payer: Self-pay | Admitting: Cardiology

## 2018-11-14 NOTE — Telephone Encounter (Signed)
Notes recorded by Sueanne Margarita, MD on 11/13/2018 at 7:10 PM EST  Please let patient know that labs were normal. Continue current medical therapy.  ------   Notes recorded by Sueanne Margarita, MD on 11/13/2018 at 7:10 PM EST  Please let patient know that heart monitor showed sinus tachycardia with average HR 104bpm. Repeat TSH and CBC. Please have her followup with PCP as her Adderall may be contributing and would like to see if she could come off of this and use something else for ADD

## 2018-11-14 NOTE — Telephone Encounter (Signed)
Patient returning call for monitor results. 

## 2018-11-14 NOTE — Telephone Encounter (Signed)
Spoke with the pt and endorsed to her, her monitor results and recommendations per Dr. Radford Pax.  Pt states she is going in to see her PCP Dr Alyson Ingles in the next week, and she will have him check her TSH/CBC and he will also be addressing her ADD medicine, for she is coming off of Adderall and going to something different anyway's, due to cost. Informed the pt that I will fax her monitor results to her PCP, so that he has this to refer to, and he knows which labs Dr. Radford Pax is requesting to be drawn. Pt verbalized understanding and agrees with this plan. Will send this message to Dr. Radford Pax, to make her aware of this plan.

## 2019-01-01 ENCOUNTER — Encounter: Payer: Self-pay | Admitting: Women's Health

## 2019-02-06 ENCOUNTER — Other Ambulatory Visit: Payer: Self-pay

## 2019-02-07 ENCOUNTER — Encounter: Payer: Self-pay | Admitting: Women's Health

## 2019-02-07 ENCOUNTER — Ambulatory Visit (INDEPENDENT_AMBULATORY_CARE_PROVIDER_SITE_OTHER): Payer: 59 | Admitting: Women's Health

## 2019-02-07 VITALS — Ht 60.0 in | Wt 190.0 lb

## 2019-02-07 DIAGNOSIS — Z13820 Encounter for screening for osteoporosis: Secondary | ICD-10-CM | POA: Diagnosis not present

## 2019-02-07 DIAGNOSIS — Z01419 Encounter for gynecological examination (general) (routine) without abnormal findings: Secondary | ICD-10-CM

## 2019-02-07 NOTE — Progress Notes (Signed)
la 

## 2019-02-07 NOTE — Progress Notes (Signed)
Mariah [name] 12/08/1960 [id number]    History:    Presents for annual exam.  Postmenopausal on no HRT with no bleeding history of infertility.  Endocrinologist manages diabetes on insulin.  Hypertension, hypercholesteremia, GERD, anxiety/depression-primary care manages.  Diverticulosis, 2012 colectomy for diverticulitis.  Not sexually active, husband prostate cancer.  Past medical history, past surgical history, family history and social history were all reviewed and documented in the EPIC chart.  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  Adopted son [name] 22 doing well.  ROS:  A ROS was performed and pertinent positives and negatives are included.  Exam:  Vitals:   02/07/19 0843  Weight: 190 lb (86.2 kg)  Height: 5' (1.524 m)   Body mass index is 37.11 kg/m.   General appearance:  Normal Thyroid:  Symmetrical, normal in size, without palpable masses or nodularity. Respiratory  Auscultation:  Clear without wheezing or rhonchi Cardiovascular  Auscultation:  Regular rate, without rubs, murmurs or gallops  Edema/varicosities:  Not grossly evident Abdominal  Soft,nontender, without masses, guarding or rebound.  Liver/spleen:  No organomegaly noted  Hernia:  None appreciated  Skin  Inspection:  Grossly normal   Breasts: Examined lying and sitting.     Right: Without masses, retractions, discharge or axillary adenopathy.     Left: Without masses, retractions, discharge or axillary adenopathy. Gentitourinary   Inguinal/mons:  Normal without inguinal adenopathy  External genitalia:  Normal  BUS/Urethra/Skene's glands:  Normal  Vagina:  Normal  Cervix:  Normal  Uterus:  normal in size, shape and contour.  Midline and mobile  Adnexa/parametria:     Rt: Without masses or tenderness.   Lt: Without masses or tenderness.  Anus and perineum: Normal  Digital rectal exam: Normal sphincter tone without palpated masses or tenderness  Assessment/Plan:  59 y.o. MBF G0 +1 adopted son for  annual exam with no GYN complaints.  Postmenopausal/no HRT/no bleeding Hypertension, hypercholesteremia, anxiety/depression, GERD-primary care manages labs and meds Hypothyroidism, diabetes on insulin-endocrinologist manages Obesity  Plan: Aware of importance of increasing exercise and decreasing calories/carbs.  SBEs, continue annual 3D screening mammogram due instructed to schedule.  Calcium rich foods, vitamin D 2000 IUs daily encouraged.Marland Kitchen  DEXA normal 2014, instructed to repeat, will schedule.  Pap normal 2016, Pap with HR HPV typing, new screening guidelines reviewed.      [place], 5:39 PM 02/07/2019

## 2019-02-07 NOTE — Patient Instructions (Signed)
Good to see you today! Vit D 2000 iu daily Health Maintenance for Postmenopausal Women Menopause is a normal process in which your ability to get pregnant comes to an end. This process happens slowly over many months or years, usually between the ages of 73 and 67. Menopause is complete when you have missed your menstrual periods for 12 months. It is important to talk with your health care provider about some of the most common conditions that affect women after menopause (postmenopausal women). These include heart disease, cancer, and bone loss (osteoporosis). Adopting a healthy lifestyle and getting preventive care can help to promote your health and wellness. The actions you take can also lower your chances of developing some of these common conditions. What should I know about menopause? During menopause, you may get a number of symptoms, such as:  Hot flashes. These can be moderate or severe.  Night sweats.  Decrease in sex drive.  Mood swings.  Headaches.  Tiredness.  Irritability.  Memory problems.  Insomnia. Choosing to treat or not to treat these symptoms is a decision that you make with your health care provider. Do I need hormone replacement therapy?  Hormone replacement therapy is effective in treating symptoms that are caused by menopause, such as hot flashes and night sweats.  Hormone replacement carries certain risks, especially as you become older. If you are thinking about using estrogen or estrogen with progestin, discuss the benefits and risks with your health care provider. What is my risk for heart disease and stroke? The risk of heart disease, heart attack, and stroke increases as you age. One of the causes may be a change in the body's hormones during menopause. This can affect how your body uses dietary fats, triglycerides, and cholesterol. Heart attack and stroke are medical emergencies. There are many things that you can do to help prevent heart disease and  stroke. Watch your blood pressure  High blood pressure causes heart disease and increases the risk of stroke. This is more likely to develop in people who have high blood pressure readings, are of African descent, or are overweight.  Have your blood pressure checked: ? Every 3-5 years if you are 49-24 years of age. ? Every year if you are 71 years old or older. Eat a healthy diet   Eat a diet that includes plenty of vegetables, fruits, low-fat dairy products, and lean protein.  Do not eat a lot of foods that are high in solid fats, added sugars, or sodium. Get regular exercise Get regular exercise. This is one of the most important things you can do for your health. Most adults should:  Try to exercise for at least 150 minutes each week. The exercise should increase your heart rate and make you sweat (moderate-intensity exercise).  Try to do strengthening exercises at least twice each week. Do these in addition to the moderate-intensity exercise.  Spend less time sitting. Even light physical activity can be beneficial. Other tips  Work with your health care provider to achieve or maintain a healthy weight.  Do not use any products that contain nicotine or tobacco, such as cigarettes, e-cigarettes, and chewing tobacco. If you need help quitting, ask your health care provider.  Know your numbers. Ask your health care provider to check your cholesterol and your blood sugar (glucose). Continue to have your blood tested as directed by your health care provider. Do I need screening for cancer? Depending on your health history and family history, you may need to  have cancer screening at different stages of your life. This may include screening for:  Breast cancer.  Cervical cancer.  Lung cancer.  Colorectal cancer. What is my risk for osteoporosis? After menopause, you may be at increased risk for osteoporosis. Osteoporosis is a condition in which bone destruction happens more  quickly than new bone creation. To help prevent osteoporosis or the bone fractures that can happen because of osteoporosis, you may take the following actions:  If you are 25-70 years old, get at least 1,000 mg of calcium and at least 600 mg of vitamin D per day.  If you are older than age 30 but younger than age 81, get at least 1,200 mg of calcium and at least 600 mg of vitamin D per day.  If you are older than age 40, get at least 1,200 mg of calcium and at least 800 mg of vitamin D per day. Smoking and drinking excessive alcohol increase the risk of osteoporosis. Eat foods that are rich in calcium and vitamin D, and do weight-bearing exercises several times each week as directed by your health care provider. How does menopause affect my mental health? Depression may occur at any age, but it is more common as you become older. Common symptoms of depression include:  Low or sad mood.  Changes in sleep patterns.  Changes in appetite or eating patterns.  Feeling an overall lack of motivation or enjoyment of activities that you previously enjoyed.  Frequent crying spells. Talk with your health care provider if you think that you are experiencing depression. General instructions See your health care provider for regular wellness exams and vaccines. This may include:  Scheduling regular health, dental, and eye exams.  Getting and maintaining your vaccines. These include: ? Influenza vaccine. Get this vaccine each year before the flu season begins. ? Pneumonia vaccine. ? Shingles vaccine. ? Tetanus, diphtheria, and pertussis (Tdap) booster vaccine. Your health care provider may also recommend other immunizations. Tell your health care provider if you have ever been abused or do not feel safe at home. Summary  Menopause is a normal process in which your ability to get pregnant comes to an end.  This condition causes hot flashes, night sweats, decreased interest in sex, mood swings,  headaches, or lack of sleep.  Treatment for this condition may include hormone replacement therapy.  Take actions to keep yourself healthy, including exercising regularly, eating a healthy diet, watching your weight, and checking your blood pressure and blood sugar levels.  Get screened for cancer and depression. Make sure that you are up to date with all your vaccines. This information is not intended to replace advice given to you by your health care provider. Make sure you discuss any questions you have with your health care provider. Document Revised: 12/14/2017 Document Reviewed: 12/14/2017 Elsevier Patient Education  2020 [employer].

## 2019-02-08 LAB — URINALYSIS, COMPLETE W/RFL CULTURE
Bacteria, UA: NONE SEEN /HPF
Bilirubin Urine: NEGATIVE
Hgb urine dipstick: NEGATIVE
Hyaline Cast: NONE SEEN /LPF
Ketones, ur: NEGATIVE
Leukocyte Esterase: NEGATIVE
Nitrites, Initial: NEGATIVE
Protein, ur: NEGATIVE
RBC / HPF: NONE SEEN /HPF (ref 0–2)
Specific Gravity, Urine: 1.026 (ref 1.001–1.03)
Squamous Epithelial / HPF: NONE SEEN /HPF (ref <=5)
WBC, UA: NONE SEEN /HPF (ref 0–5)
pH: 5.5 (ref 5.0–8.0)

## 2019-02-08 LAB — PAP, TP IMAGING W/ HPV RNA, RFLX HPV TYPE 16,18/45: HPV DNA High Risk: NOT DETECTED

## 2019-02-08 LAB — NO CULTURE INDICATED

## 2019-12-27 ENCOUNTER — Ambulatory Visit (INDEPENDENT_AMBULATORY_CARE_PROVIDER_SITE_OTHER): Payer: 59 | Admitting: Podiatry

## 2019-12-27 ENCOUNTER — Encounter: Payer: Self-pay | Admitting: Podiatry

## 2019-12-27 ENCOUNTER — Other Ambulatory Visit: Payer: Self-pay

## 2019-12-27 DIAGNOSIS — L309 Dermatitis, unspecified: Secondary | ICD-10-CM

## 2019-12-27 DIAGNOSIS — M21619 Bunion of unspecified foot: Secondary | ICD-10-CM | POA: Diagnosis not present

## 2019-12-27 NOTE — Progress Notes (Signed)
Subjective:   Patient ID: Mariah [name], female   DOB: 59 y.o.   MRN: [id number]   HPI Patient presents stating I am concerned about my feet and I have had a lot of dry skin and I wanted to get checked for my diabetes and states her last A1c was 8.2   ROS      Objective:  Physical Exam  Neurovascular status remains intact with range of motion adequate of the subtalar midtarsal joint.  Patient has mild structural bunion deformity left and no increased discomfort associated with it.  Patient has good digit perfusion well oriented x3 and has very dry skin bilateral with nothing in between her toes     Assessment:  Significant dermatitis bilateral dry skin formation mild structural bunion deformity left     Plan:  H&P reviewed condition discussed importance of good control of her diabetes and daily inspections of her feet.  Advised her on different modalities to use for her dry skin and also structural bunion do not recommend correction at this point but if it becomes worse or symptomatic may require correction at 1 point in future

## 2020-03-19 ENCOUNTER — Ambulatory Visit (INDEPENDENT_AMBULATORY_CARE_PROVIDER_SITE_OTHER): Payer: Self-pay | Admitting: Nurse Practitioner

## 2020-03-19 ENCOUNTER — Ambulatory Visit: Payer: Self-pay | Admitting: Nurse Practitioner

## 2020-03-19 ENCOUNTER — Encounter: Payer: Self-pay | Admitting: Nurse Practitioner

## 2020-03-19 ENCOUNTER — Other Ambulatory Visit: Payer: Self-pay

## 2020-03-19 VITALS — BP 126/84 | HR 92 | Resp 14 | Ht 60.0 in | Wt 180.4 lb

## 2020-03-19 DIAGNOSIS — B373 Candidiasis of vulva and vagina: Secondary | ICD-10-CM

## 2020-03-19 DIAGNOSIS — Z01419 Encounter for gynecological examination (general) (routine) without abnormal findings: Secondary | ICD-10-CM

## 2020-03-19 DIAGNOSIS — Z780 Asymptomatic menopausal state: Secondary | ICD-10-CM

## 2020-03-19 DIAGNOSIS — B3731 Acute candidiasis of vulva and vagina: Secondary | ICD-10-CM

## 2020-03-19 MED ORDER — FLUCONAZOLE 100 MG PO TABS: ORAL_TABLET | ORAL | 0 refills | Status: DC

## 2020-03-19 NOTE — Progress Notes (Signed)
   Mariah [name] 1960/07/15 [id number]   History:  60 y.o. G0 presents for annual exam without GYN complaints. Postmenopausal - no HRT, no bleeding. Normal pap and mammogram history. HTN, asthma, and hypothyroidism managed by PCP, T2DM managed by endocrinology. History of vaginal yeast infections - She is on Jardiance.   Gynecologic History No LMP recorded. Patient is postmenopausal.   Contraception/Family planning: post menopausal status  Health Maintenance Last Pap: 02/07/2019. Results were: normal Last mammogram: 01/2020 per patient. Results were:  normal Last colonoscopy: 2012 Last Dexa: 2014. Results were: normal  Past medical history, past surgical history, family history and social history were all reviewed and documented in the EPIC chart.  ROS:  A ROS was performed and pertinent positives and negatives are included.  Exam:  Vitals:   03/19/20 1025  BP: 126/84  Pulse: 92  Resp: 14  Weight: 180 lb 6.4 oz (81.8 kg)  Height: 5' (1.524 m)   Body mass index is 35.23 kg/m.  General appearance:  Normal Thyroid:  Symmetrical, normal in size, without palpable masses or nodularity. Respiratory  Auscultation:  Clear without wheezing or rhonchi Cardiovascular  Auscultation:  Regular rate, without rubs, murmurs or gallops  Edema/varicosities:  Not grossly evident Abdominal  Soft,nontender, without masses, guarding or rebound.  Liver/spleen:  No organomegaly noted  Hernia:  None appreciated  Skin  Inspection:  Grossly normal   Breasts: Examined lying and sitting.   Right: Without masses, retractions, discharge or axillary adenopathy.   Left: Without masses, retractions, discharge or axillary adenopathy. Gentitourinary   Inguinal/mons:  Normal without inguinal adenopathy  External genitalia:  Normal  BUS/Urethra/Skene's glands:  Normal  Vagina:  Normal  Cervix:  Normal  Uterus:  Normal in size, shape and contour.  Midline and mobile  Adnexa/parametria:      Rt: Without masses or tenderness.   Lt: Without masses or tenderness.  Anus and perineum: Normal  Digital rectal exam: Normal sphincter tone without palpated masses or tenderness  Assessment/Plan:  60 y.o. G0 for annual exam.   Well female exam with routine gynecological exam - Education provided on SBEs, importance of preventative screenings, current guidelines, high calcium diet, regular exercise, and multivitamin daily. Labs with PCP and endocrinology.   Postmenopausal - denies menopausal symptoms. No HRT, no bleeding.   Vaginal candidiasis - Plan: fluconazole (DIFLUCAN) 100 MG tablet as needed. #15 provided and aware to only take if needed. History of recurrent yeast infections since starting Jardiance. Reports less infections this past year.   Screening for cervical cancer - Normal Pap history.  Will repeat at 5-year interval per guidelines.  Screening for breast cancer - Normal mammogram history.  Continue annual screenings.  Normal breast exam today.  Screening for colon cancer - 2012 colonoscopy. Will repeat at GI's recommended interval.   Return in 1 year for annual.      [name] DNP, 10:43 AM 03/19/2020

## 2020-03-19 NOTE — Patient Instructions (Signed)

## 2020-05-28 DIAGNOSIS — H401111 Primary open-angle glaucoma, right eye, mild stage: Secondary | ICD-10-CM | POA: Diagnosis not present

## 2020-05-28 DIAGNOSIS — H35033 Hypertensive retinopathy, bilateral: Secondary | ICD-10-CM | POA: Diagnosis not present

## 2020-05-28 DIAGNOSIS — H40002 Preglaucoma, unspecified, left eye: Secondary | ICD-10-CM | POA: Diagnosis not present

## 2020-05-28 DIAGNOSIS — E113293 Type 2 diabetes mellitus with mild nonproliferative diabetic retinopathy without macular edema, bilateral: Secondary | ICD-10-CM | POA: Diagnosis not present

## 2020-06-10 DIAGNOSIS — I10 Essential (primary) hypertension: Secondary | ICD-10-CM | POA: Diagnosis not present

## 2020-06-10 DIAGNOSIS — E113293 Type 2 diabetes mellitus with mild nonproliferative diabetic retinopathy without macular edema, bilateral: Secondary | ICD-10-CM | POA: Diagnosis not present

## 2020-06-10 DIAGNOSIS — Z794 Long term (current) use of insulin: Secondary | ICD-10-CM | POA: Diagnosis not present

## 2020-06-10 DIAGNOSIS — E1165 Type 2 diabetes mellitus with hyperglycemia: Secondary | ICD-10-CM | POA: Diagnosis not present

## 2020-06-11 ENCOUNTER — Other Ambulatory Visit (HOSPITAL_COMMUNITY): Payer: Self-pay

## 2020-06-12 ENCOUNTER — Other Ambulatory Visit (HOSPITAL_COMMUNITY): Payer: Self-pay

## 2020-06-12 MED ORDER — SYNJARDY 12.5-1000 MG PO TABS
1.0000 | ORAL_TABLET | Freq: Two times a day (BID) | ORAL | 0 refills | Status: DC
  Filled 2020-06-16: qty 180, 90d supply, fill #0
  Filled 2020-09-18 – 2020-12-19 (×2): qty 180, 90d supply, fill #1

## 2020-06-12 MED ORDER — GLYCOPYRROLATE 1 MG PO TABS
1.0000 mg | ORAL_TABLET | Freq: Two times a day (BID) | ORAL | 0 refills | Status: DC
  Filled 2020-06-16: qty 180, 90d supply, fill #0

## 2020-06-12 MED ORDER — LEVALBUTEROL HCL 0.63 MG/3ML IN NEBU: INHALATION_SOLUTION | RESPIRATORY_TRACT | 0 refills | Status: DC

## 2020-06-12 MED ORDER — INSULIN PEN NEEDLE 32G X 4 MM MISC
0 refills | Status: DC
  Filled 2020-06-12: qty 200, 90d supply, fill #0
  Filled 2020-06-12: qty 100, 50d supply, fill #0
  Filled 2020-10-09: qty 200, 90d supply, fill #1

## 2020-06-12 MED ORDER — MELOXICAM 7.5 MG PO TABS: 7.5000 mg | ORAL_TABLET | Freq: Two times a day (BID) | ORAL | 0 refills | Status: DC

## 2020-06-12 MED ORDER — ALBUTEROL SULFATE HFA 108 (90 BASE) MCG/ACT IN AERS: 1.0000 | INHALATION_SPRAY | RESPIRATORY_TRACT | 0 refills | Status: DC

## 2020-06-12 MED ORDER — DULOXETINE HCL 60 MG PO CPEP
ORAL_CAPSULE | ORAL | 0 refills | Status: DC
  Filled 2020-06-20: qty 30, 30d supply, fill #0

## 2020-06-12 MED ORDER — FREESTYLE LIBRE 14 DAY SENSOR MISC
0 refills | Status: DC
  Filled 2020-06-12: qty 2, 28d supply, fill #0
  Filled 2020-09-27 – 2020-10-09 (×2): qty 2, 28d supply, fill #1

## 2020-06-12 MED ORDER — FLUTICASONE-SALMETEROL 500-50 MCG/ACT IN AEPB: 1.0000 | INHALATION_SPRAY | Freq: Two times a day (BID) | RESPIRATORY_TRACT | 0 refills | Status: DC

## 2020-06-12 MED ORDER — INSULIN LISPRO PROT & LISPRO (75-25 MIX) 100 UNIT/ML KWIKPEN: PEN_INJECTOR | SUBCUTANEOUS | 0 refills | Status: DC

## 2020-06-12 MED ORDER — GLOBAL EASE INJECT PEN NEEDLES 31G X 8 MM MISC: 0 refills | Status: DC

## 2020-06-12 MED ORDER — SYNJARDY 12.5-1000 MG PO TABS
1.0000 | ORAL_TABLET | Freq: Two times a day (BID) | ORAL | 0 refills | Status: DC
  Filled 2020-09-08: qty 30, 30d supply, fill #0
  Filled 2020-09-18: qty 180, 90d supply, fill #0

## 2020-06-12 MED ORDER — METFORMIN HCL ER 500 MG PO TB24
1000.0000 mg | ORAL_TABLET | Freq: Two times a day (BID) | ORAL | 0 refills | Status: DC
  Filled 2020-06-12: qty 360, 90d supply, fill #0

## 2020-06-14 ENCOUNTER — Other Ambulatory Visit (HOSPITAL_COMMUNITY): Payer: Self-pay

## 2020-06-16 ENCOUNTER — Other Ambulatory Visit (HOSPITAL_COMMUNITY): Payer: Self-pay

## 2020-06-16 MED ORDER — METHYLPHENIDATE HCL ER (OSM) 27 MG PO TBCR
EXTENDED_RELEASE_TABLET | ORAL | 0 refills | Status: DC
  Filled 2020-06-20: qty 30, fill #0
  Filled 2020-06-27: qty 30, 30d supply, fill #0

## 2020-06-16 MED ORDER — EMPAGLIFLOZIN 25 MG PO TABS: 25.0000 mg | ORAL_TABLET | Freq: Every day | ORAL | 3 refills | Status: DC

## 2020-06-17 ENCOUNTER — Other Ambulatory Visit (HOSPITAL_COMMUNITY): Payer: Self-pay

## 2020-06-17 DIAGNOSIS — G4733 Obstructive sleep apnea (adult) (pediatric): Secondary | ICD-10-CM | POA: Diagnosis not present

## 2020-06-20 ENCOUNTER — Other Ambulatory Visit (HOSPITAL_COMMUNITY): Payer: Self-pay

## 2020-06-23 ENCOUNTER — Other Ambulatory Visit (HOSPITAL_COMMUNITY): Payer: Self-pay

## 2020-06-23 MED ORDER — BRIMONIDINE TARTRATE 0.1 % OP SOLN
OPHTHALMIC | 1 refills | Status: DC
  Filled 2020-06-23: qty 15, 75d supply, fill #0

## 2020-06-24 ENCOUNTER — Other Ambulatory Visit (HOSPITAL_COMMUNITY): Payer: Self-pay

## 2020-06-27 ENCOUNTER — Other Ambulatory Visit (HOSPITAL_COMMUNITY): Payer: Self-pay

## 2020-06-27 MED ORDER — FREESTYLE LIBRE 2 SENSOR MISC
11 refills | Status: DC
  Filled 2020-06-27: qty 2, 28d supply, fill #0
  Filled 2020-08-05: qty 2, 28d supply, fill #1
  Filled 2020-09-08: qty 2, 28d supply, fill #2
  Filled 2020-10-14: qty 2, 28d supply, fill #3
  Filled 2020-11-14: qty 2, 28d supply, fill #4
  Filled 2020-12-18: qty 2, 28d supply, fill #5
  Filled 2021-01-23: qty 2, 28d supply, fill #6

## 2020-06-27 MED ORDER — FREESTYLE LIBRE 14 DAY SENSOR MISC: 2 refills | Status: DC

## 2020-07-19 ENCOUNTER — Other Ambulatory Visit (HOSPITAL_COMMUNITY): Payer: Self-pay

## 2020-07-19 MED ORDER — PANTOPRAZOLE SODIUM 40 MG PO TBEC
40.0000 mg | DELAYED_RELEASE_TABLET | Freq: Every morning | ORAL | 0 refills | Status: DC
  Filled 2020-07-19: qty 90, 90d supply, fill #0

## 2020-07-21 ENCOUNTER — Other Ambulatory Visit (HOSPITAL_COMMUNITY): Payer: Self-pay

## 2020-07-21 MED ORDER — DULOXETINE HCL 60 MG PO CPEP
60.0000 mg | ORAL_CAPSULE | Freq: Every day | ORAL | 0 refills | Status: DC
  Filled 2020-07-21: qty 90, 90d supply, fill #0

## 2020-07-22 ENCOUNTER — Other Ambulatory Visit (HOSPITAL_COMMUNITY): Payer: Self-pay

## 2020-07-22 DIAGNOSIS — W57XXXA Bitten or stung by nonvenomous insect and other nonvenomous arthropods, initial encounter: Secondary | ICD-10-CM | POA: Diagnosis not present

## 2020-07-22 DIAGNOSIS — S20362A Insect bite (nonvenomous) of left front wall of thorax, initial encounter: Secondary | ICD-10-CM | POA: Diagnosis not present

## 2020-07-22 DIAGNOSIS — J309 Allergic rhinitis, unspecified: Secondary | ICD-10-CM | POA: Diagnosis not present

## 2020-07-22 MED ORDER — TRIAMCINOLONE ACETONIDE 0.1 % EX CREA
TOPICAL_CREAM | CUTANEOUS | 0 refills | Status: DC
  Filled 2020-07-22: qty 30, 10d supply, fill #0

## 2020-07-22 MED ORDER — CETIRIZINE HCL 10 MG PO TABS
ORAL_TABLET | ORAL | 1 refills | Status: DC
  Filled 2020-07-22: qty 90, 90d supply, fill #0

## 2020-08-05 ENCOUNTER — Other Ambulatory Visit (HOSPITAL_COMMUNITY): Payer: Self-pay

## 2020-08-06 ENCOUNTER — Other Ambulatory Visit (HOSPITAL_COMMUNITY): Payer: Self-pay

## 2020-08-06 MED ORDER — METHYLPHENIDATE HCL ER (OSM) 27 MG PO TBCR
27.0000 mg | EXTENDED_RELEASE_TABLET | Freq: Every morning | ORAL | 0 refills | Status: DC
  Filled 2020-08-06: qty 30, 30d supply, fill #0

## 2020-08-08 ENCOUNTER — Other Ambulatory Visit (HOSPITAL_COMMUNITY): Payer: Self-pay

## 2020-08-19 ENCOUNTER — Other Ambulatory Visit (HOSPITAL_COMMUNITY): Payer: Self-pay

## 2020-08-19 DIAGNOSIS — G4700 Insomnia, unspecified: Secondary | ICD-10-CM | POA: Diagnosis not present

## 2020-08-19 DIAGNOSIS — E782 Mixed hyperlipidemia: Secondary | ICD-10-CM | POA: Diagnosis not present

## 2020-08-19 DIAGNOSIS — F900 Attention-deficit hyperactivity disorder, predominantly inattentive type: Secondary | ICD-10-CM | POA: Diagnosis not present

## 2020-08-19 DIAGNOSIS — I10 Essential (primary) hypertension: Secondary | ICD-10-CM | POA: Diagnosis not present

## 2020-08-19 DIAGNOSIS — J309 Allergic rhinitis, unspecified: Secondary | ICD-10-CM | POA: Diagnosis not present

## 2020-08-19 DIAGNOSIS — J4520 Mild intermittent asthma, uncomplicated: Secondary | ICD-10-CM | POA: Diagnosis not present

## 2020-08-19 DIAGNOSIS — E1165 Type 2 diabetes mellitus with hyperglycemia: Secondary | ICD-10-CM | POA: Diagnosis not present

## 2020-08-19 DIAGNOSIS — K219 Gastro-esophageal reflux disease without esophagitis: Secondary | ICD-10-CM | POA: Diagnosis not present

## 2020-08-19 DIAGNOSIS — F419 Anxiety disorder, unspecified: Secondary | ICD-10-CM | POA: Diagnosis not present

## 2020-08-19 MED ORDER — CETIRIZINE HCL 10 MG PO TABS
ORAL_TABLET | ORAL | 1 refills | Status: DC
  Filled 2020-08-19: qty 90, 90d supply, fill #0

## 2020-08-19 MED ORDER — PANTOPRAZOLE SODIUM 40 MG PO TBEC
DELAYED_RELEASE_TABLET | ORAL | 1 refills | Status: DC
  Filled 2020-08-19: qty 30, 30d supply, fill #0

## 2020-08-19 MED ORDER — PRAVASTATIN SODIUM 40 MG PO TABS
ORAL_TABLET | ORAL | 1 refills | Status: DC
  Filled 2020-08-19: qty 90, 90d supply, fill #0
  Filled 2020-11-14: qty 90, 90d supply, fill #1

## 2020-08-19 MED ORDER — FLUTICASONE PROPIONATE 50 MCG/ACT NA SUSP
NASAL | 11 refills | Status: DC
  Filled 2020-08-19: qty 16, 30d supply, fill #0

## 2020-08-19 MED ORDER — METHYLPHENIDATE HCL ER (OSM) 27 MG PO TBCR: EXTENDED_RELEASE_TABLET | ORAL | 0 refills | Status: DC

## 2020-08-19 MED ORDER — ZOLPIDEM TARTRATE 10 MG PO TABS
ORAL_TABLET | ORAL | 0 refills | Status: DC
  Filled 2020-08-19: qty 30, 30d supply, fill #0

## 2020-08-19 MED ORDER — METHYLPHENIDATE HCL ER (OSM) 27 MG PO TBCR
EXTENDED_RELEASE_TABLET | ORAL | 0 refills | Status: DC
  Filled 2020-09-08: qty 30, fill #0
  Filled 2020-10-09: qty 30, 30d supply, fill #0

## 2020-08-19 MED ORDER — METHYLPHENIDATE HCL ER (OSM) 27 MG PO TBCR
EXTENDED_RELEASE_TABLET | ORAL | 0 refills | Status: DC
  Filled 2020-09-09: qty 30, 30d supply, fill #0

## 2020-08-19 MED ORDER — DULOXETINE HCL 60 MG PO CPEP
ORAL_CAPSULE | ORAL | 0 refills | Status: DC
  Filled 2020-08-19 – 2020-10-09 (×2): qty 90, 90d supply, fill #0

## 2020-08-19 MED ORDER — LISINOPRIL 20 MG PO TABS
ORAL_TABLET | ORAL | 1 refills | Status: DC
  Filled 2020-08-19: qty 90, 90d supply, fill #0
  Filled 2020-12-18: qty 90, 90d supply, fill #1

## 2020-09-09 ENCOUNTER — Other Ambulatory Visit (HOSPITAL_COMMUNITY): Payer: Self-pay

## 2020-09-10 ENCOUNTER — Other Ambulatory Visit (HOSPITAL_COMMUNITY): Payer: Self-pay

## 2020-09-16 DIAGNOSIS — G4733 Obstructive sleep apnea (adult) (pediatric): Secondary | ICD-10-CM | POA: Diagnosis not present

## 2020-09-18 ENCOUNTER — Other Ambulatory Visit (HOSPITAL_COMMUNITY): Payer: Self-pay

## 2020-09-19 ENCOUNTER — Other Ambulatory Visit (HOSPITAL_COMMUNITY): Payer: Self-pay

## 2020-09-19 MED ORDER — GLYCOPYRROLATE 1 MG PO TABS
1.0000 mg | ORAL_TABLET | Freq: Two times a day (BID) | ORAL | 0 refills | Status: DC
  Filled 2020-09-19: qty 180, 90d supply, fill #0

## 2020-09-22 ENCOUNTER — Other Ambulatory Visit (HOSPITAL_COMMUNITY): Payer: Self-pay

## 2020-09-22 DIAGNOSIS — E1165 Type 2 diabetes mellitus with hyperglycemia: Secondary | ICD-10-CM | POA: Diagnosis not present

## 2020-09-22 DIAGNOSIS — Z79899 Other long term (current) drug therapy: Secondary | ICD-10-CM | POA: Diagnosis not present

## 2020-09-22 DIAGNOSIS — Z6836 Body mass index (BMI) 36.0-36.9, adult: Secondary | ICD-10-CM | POA: Diagnosis not present

## 2020-09-22 DIAGNOSIS — Z794 Long term (current) use of insulin: Secondary | ICD-10-CM | POA: Diagnosis not present

## 2020-09-22 DIAGNOSIS — E113299 Type 2 diabetes mellitus with mild nonproliferative diabetic retinopathy without macular edema, unspecified eye: Secondary | ICD-10-CM | POA: Diagnosis not present

## 2020-09-22 DIAGNOSIS — Z7984 Long term (current) use of oral hypoglycemic drugs: Secondary | ICD-10-CM | POA: Diagnosis not present

## 2020-09-27 ENCOUNTER — Other Ambulatory Visit (HOSPITAL_COMMUNITY): Payer: Self-pay

## 2020-09-29 DIAGNOSIS — Z8719 Personal history of other diseases of the digestive system: Secondary | ICD-10-CM | POA: Diagnosis not present

## 2020-09-29 DIAGNOSIS — T148XXA Other injury of unspecified body region, initial encounter: Secondary | ICD-10-CM | POA: Diagnosis not present

## 2020-09-29 DIAGNOSIS — K430 Incisional hernia with obstruction, without gangrene: Secondary | ICD-10-CM | POA: Diagnosis not present

## 2020-10-09 ENCOUNTER — Other Ambulatory Visit (HOSPITAL_COMMUNITY): Payer: Self-pay

## 2020-10-14 ENCOUNTER — Other Ambulatory Visit (HOSPITAL_COMMUNITY): Payer: Self-pay

## 2020-10-15 ENCOUNTER — Other Ambulatory Visit (HOSPITAL_COMMUNITY): Payer: Self-pay

## 2020-10-21 ENCOUNTER — Ambulatory Visit (INDEPENDENT_AMBULATORY_CARE_PROVIDER_SITE_OTHER): Payer: 59 | Admitting: Allergy and Immunology

## 2020-10-21 ENCOUNTER — Encounter: Payer: Self-pay | Admitting: Allergy and Immunology

## 2020-10-21 ENCOUNTER — Other Ambulatory Visit: Payer: Self-pay

## 2020-10-21 ENCOUNTER — Other Ambulatory Visit (HOSPITAL_COMMUNITY): Payer: Self-pay

## 2020-10-21 VITALS — BP 128/82 | HR 93 | Temp 97.1°F | Resp 18 | Ht 60.0 in | Wt 187.8 lb

## 2020-10-21 DIAGNOSIS — J3089 Other allergic rhinitis: Secondary | ICD-10-CM | POA: Diagnosis not present

## 2020-10-21 DIAGNOSIS — J301 Allergic rhinitis due to pollen: Secondary | ICD-10-CM | POA: Diagnosis not present

## 2020-10-21 DIAGNOSIS — H1010 Acute atopic conjunctivitis, unspecified eye: Secondary | ICD-10-CM | POA: Diagnosis not present

## 2020-10-21 DIAGNOSIS — J4520 Mild intermittent asthma, uncomplicated: Secondary | ICD-10-CM

## 2020-10-21 DIAGNOSIS — T781XXD Other adverse food reactions, not elsewhere classified, subsequent encounter: Secondary | ICD-10-CM | POA: Diagnosis not present

## 2020-10-21 MED ORDER — AZELASTINE HCL 0.1 % NA SOLN
NASAL | 5 refills | Status: DC
  Filled 2020-10-21: qty 30, 30d supply, fill #0

## 2020-10-21 MED ORDER — ADVAIR DISKUS 500-50 MCG/ACT IN AEPB
1.0000 | INHALATION_SPRAY | Freq: Two times a day (BID) | RESPIRATORY_TRACT | 5 refills | Status: DC
  Filled 2020-10-21: qty 60, 30d supply, fill #0

## 2020-10-21 NOTE — Progress Notes (Signed)
[place] [facility] [place] [facility] [place] [facility] [place] [facility] [place]   Dear Dr. Alyson Ingles,  Thank you for referring Mariah [name] to the [facility] on 10/21/2020.   Below is a summation of this patient's evaluation and recommendations.  Thank you for your referral. I will keep you informed about this patient's response to treatment.   If you have any questions please do not hesitate to contact me.   Sincerely,  Jiles Prows, MD Allergy / Immunology [facility]   ______________________________________________________________________    NEW PATIENT NOTE  Referring Provider: Maury Dus, MD Primary Provider: Maury Dus, MD Date of office visit: 10/21/2020    Subjective:   Chief Complaint:  Mariah [name] (DOB: 28-Apr-1960) is a 60 y.o. female who presents to the clinic on 10/21/2020 with a chief complaint of Allergy Testing (Mold; mildew) .     HPI: Mariah [name] presents to this clinic in evaluation of allergic disease of her respiratory tract.  Apparently I had seen her in this clinic 20 years ago for a similar issue.  She has a history of intermittent asthma that fortunately has improved as she has aged.  She does have an Advair inhaler and a short acting bronchodilator but she rarely uses these agents.  She only requires these medications when she develops a respiratory tract infection.  Apparently in 2022 she did contract COVID with significant lower airway symptoms including coughing and some wheezing for which she had to use her short acting bronchodilator and she restarted her Advair.  Fortunately, the total duration of this issue was about 5 days, without any long-term sequela.  She has a history of allergic rhinoconjunctivitis with sneezing and stuffy nose and runny eyes that flares during the spring and fall especially following exposure to grass and trees or smoke or cooking  fumes.  She has used some nasal steroids in the past but unfortunately developed epistaxis.  She uses some Pataday and some antihistamines.  She also has a history of dry eye syndrome for which she is using a prescription eyedrop.  She states that she ate mushrooms several decades ago and developed an itchy mouth and has been without mushroom consumption since that point in time.  If she eats chocolate she sometimes breaks out on her lips or her face days later.  If she is stung by mosquito she develops relatively large local reaction that are intensely itchy without any associated systemic or constitutional symptoms.  She has had 4 COVID vaccines and as noted above contracted COVID in 2022.  She has received this year's flu vaccine.  Past Medical History:  Diagnosis Date   Allergy    Anemia    Asthma    Body odor    from colostomy takedown   Cervicalgia    Depression    Diabetes mellitus without complication (HCC)    Fibroid    GERD (gastroesophageal reflux disease)    Hyperlipidemia    Hypertension    Hypothyroidism    Infertility    OSA (obstructive sleep apnea)    upper airway resistance syndrome on CPAP at 9cm H2O   Reflux    Vertigo     Past Surgical History:  Procedure Laterality Date   ABDOMINAL SURGERY  2011   PERFORATED DIVERTICULUM   COLON SURGERY  2012   sigmoid colectomy for perforated sigmoid diverticulitis   COLOSTOMY     TEMPORARY   COLOSTOMY  [place]     TONSILLECTOMY     VENTRAL HERNIA REPAIR  2012    Allergies as of 10/21/2020       Reactions   Codeine Hives, Swelling   Other Other (See Comments)   Flonase make patient's nose bleed.   Seasonal Ic [cholestatin]         Medication List    Alphagan P 0.1 % Soln Generic drug: brimonidine Place 1 drop into both eyes 2 times a day   aspirin 81 MG tablet Take 81 mg by mouth daily.   Biotin 1000 MCG tablet Take 1,000 mcg by mouth 2 (two) times daily.   cetirizine 10 MG tablet Commonly  known as: ZyrTEC Allergy Take 1 tablet by mouth daily as needed for allergies or itching   Concerta 27 MG CR tablet Generic drug: methylphenidate Take 1 tablet by mouth once daily in the morning. May fill 10/05/20   methylphenidate 27 MG CR tablet Commonly known as: Concerta Take 1 tablet by mouth in the morning. May fill 11/04/20   Diclofenac Sodium 2 % Soln Pennsaid 20 mg/gram/actuation (2 %) topical soln in metered-dose pump  apply ONE TO TWO pumps topically TO affected AREAS TWICE A DAY   DULoxetine 30 MG capsule Commonly known as: CYMBALTA Take 1 capsule by mouth daily.   DULoxetine 60 MG capsule Commonly known as: CYMBALTA Take 1 capsule by mouth once daily   empagliflozin 25 MG Tabs tablet Commonly known as: JARDIANCE Jardiance 25 mg tablet   empagliflozin 25 MG Tabs tablet Commonly known as: JARDIANCE Take 1 tablet (25 mg total) by mouth daily.   EpiPen 2-Pak 0.3 mg/0.3 mL Soaj injection Generic drug: EPINEPHrine Ad lib.   ferrous sulfate 325 (65 FE) MG tablet Take 325 mg by mouth 2 (two) times daily with a meal.   fluticasone 50 MCG/ACT nasal spray Commonly known as: FLONASE Use 2 sprays in each nostril at bedtime as directed   fluticasone-salmeterol 500-50 MCG/ACT Aepb Commonly known as: Advair Diskus Inhale 1 puff into the lungs 2 (two) times daily.   FreeStyle Libre 14 Day Reader Kerrin Mo 1 Device by [employer].(Non-Drug; Combo Route) route continuous.   FreeStyle Libre 14 Day Sensor Misc FreeStyle Libre 14 Day Sensor kit  PLACE 1 PATCH EVERY 14 DAYS TO USE WITH READER   FreeStyle Libre 2 Sensor Misc Inject 1 sensor to the skin every 14 days for continuous glucose monitoring.   hydrocortisone 2.5 % cream Apply 1 application topically daily.   Insulin Lispro Prot & Lispro (75-25) 100 UNIT/ML Kwikpen Commonly known as: HumaLOG Mix 75/25 KwikPen Inject 90 units into the skin before breakfast and 60 units before dinner.   Insulin Lispro Prot & Lispro (75-25)  100 UNIT/ML Kwikpen Commonly known as: HUMALOG 75/25 MIX Inject 90 units before breakfast, 48 units before dinner   Insulin Pen Needle 32G X 4 MM Misc Use to inject insulin three times daily   Global Ease Inject Pen Needles 31G X 8 MM Misc Generic drug: Insulin Pen Needle USE TO INJECT INSULIN TWICE DAILY   Unifine Pentips 32G X 4 MM Misc Generic drug: Insulin Pen Needle Use to inject insulin twice daily   insulin regular 100 units/mL injection Commonly known as: NOVOLIN R Inject 500 Units into the skin 3 (three) times daily before meals. 120 units in morning and 70 units at night   levalbuterol 0.63 MG/3ML nebulizer solution Commonly known as: XOPENEX USE 1 VIAL IN NEBULIZER EVERY 6 HOURS AS NEEDED AS NEEDED  FOR WHEEZING.   metFORMIN 500 MG 24 hr tablet Commonly known as: GLUCOPHAGE-XR Take 2 tablets (1,000 mg total) by mouth 2 (two) times daily.   pantoprazole 40 MG tablet Commonly known as: PROTONIX Take 40 mg by mouth every morning.   pravastatin 40 MG tablet Commonly known as: PRAVACHOL Take 1 tablet by mouth daily.   ProAir HFA 108 (90 Base) MCG/ACT inhaler Generic drug: albuterol Inhale 2 puffs into the lungs every 6 (six) hours as needed.   albuterol 108 (90 Base) MCG/ACT inhaler Commonly known as: VENTOLIN HFA Inhale 1-2 puffs into the lungs every 4 hours as needed for wheeze.   Synjardy 12.05-998 MG Tabs Generic drug: Empagliflozin-metFORMIN HCl Take 1 tablet by mouth 2 times daily.   triamcinolone cream 0.1 % Commonly known as: KENALOG Apply to affected areas 2 times a day as needed   Trulicity 4.5 ON/6.2XB Sopn Generic drug: Dulaglutide   vitamin C 500 MG tablet Commonly known as: ASCORBIC ACID Take 500 mg by mouth every other day.   Vitamin D 50 MCG (2000 UT) tablet Take 2,000 Units by mouth 2 (two) times daily.   zolpidem 5 MG tablet Commonly known as: AMBIEN Take 5 mg by mouth as needed.    Review of systems negative except as noted in  HPI / PMHx or noted below:  Review of Systems  Constitutional: Negative.   HENT: Negative.    Eyes: Negative.   Respiratory: Negative.    Cardiovascular: Negative.   Gastrointestinal: Negative.   Genitourinary: Negative.   Musculoskeletal: Negative.   Skin: Negative.   Neurological: Negative.   Endo/Heme/Allergies: Negative.   Psychiatric/Behavioral: Negative.     Family History  Problem Relation Age of Onset   Hypertension Mother    Diabetes Maternal Grandmother    Hypertension Maternal Grandmother    Heart disease Maternal Grandmother    Diabetes Father    Hypertension Father    Cancer Maternal Grandfather        COLON   Breast cancer Paternal Grandmother        Age 50's    Social History   Socioeconomic History   Marital status: Married    Spouse name: Not on file   Number of children: Not on file   Years of education: Not on file   Highest education level: Not on file  Occupational History   Not on file  Tobacco Use   Smoking status: Former    Types: Cigarettes    Quit date: 11/19/1989    Years since quitting: 30.9   Smokeless tobacco: Never  Vaping Use   Vaping Use: Never used  Substance and Sexual Activity   Alcohol use: No   Drug use: No   Sexual activity: Not Currently    Partners: Male    Birth control/protection: Post-menopausal    Comment: 1ST intercourse-17, partners- 40, married- 29 yrs   Other Topics Concern   Not on file  Social History Narrative   Not on file   Social Determinants of Health   Financial Resource Strain: Not on file  Food Insecurity: Not on file  Transportation Needs: Not on file  Physical Activity: Not on file  Stress: Not on file  Social Connections: Not on file  Intimate Partner Violence: Not on file    Environmental and Social history  Lives in a house with a dry environment, no animals located inside the household, no carpet in the bedroom, no plastic on the bed, no plastic on the pillow, and  employment in an  office setting in coding and billing.  Objective:   Vitals:   10/21/20 1655  BP: 128/82  Pulse: 93  Resp: 18  Temp: (!) 97.1 F (36.2 C)  SpO2: 97%   Height: 5' (152.4 cm) Weight: 187 lb 12.8 oz (85.2 kg)  Physical Exam Constitutional:      Appearance: She is not diaphoretic.  HENT:     Head: Normocephalic.     Right Ear: Tympanic membrane, ear canal and external ear normal.     Left Ear: Tympanic membrane, ear canal and external ear normal.     Nose: Nose normal. No mucosal edema or rhinorrhea.     Mouth/Throat:     Pharynx: Uvula midline. No oropharyngeal exudate.  Eyes:     Conjunctiva/sclera: Conjunctivae normal.  Neck:     Thyroid: No thyromegaly.     Trachea: Trachea normal. No tracheal tenderness or tracheal deviation.  Cardiovascular:     Rate and Rhythm: Normal rate and regular rhythm.     Heart sounds: Normal heart sounds, S1 normal and S2 normal. No murmur heard. Pulmonary:     Effort: No respiratory distress.     Breath sounds: Normal breath sounds. No stridor. No wheezing or rales.  Lymphadenopathy:     Head:     Right side of head: No tonsillar adenopathy.     Left side of head: No tonsillar adenopathy.     Cervical: No cervical adenopathy.  Skin:    Findings: No erythema or rash.     Nails: There is no clubbing.  Neurological:     Mental Status: She is alert.    Diagnostics: Allergy skin tests were performed.  She demonstrated hypersensitivity to grass and ragweed pollen, molds, and very slight hypersensitivity against soy and peanut.  Should be noted that she can consume these foods without much problem.  She did not demonstrate hypersensitivity to mushroom.  Spirometry was performed and demonstrated an FEV1 of 1.18 @ 63 % of predicted. FEV1/FVC = 0.76   Assessment and Plan:    1. Asthma, mild intermittent, well-controlled   2. Perennial allergic rhinitis   3. Seasonal allergic rhinitis due to pollen   4. Seasonal allergic conjunctivitis    5. Adverse food reaction, subsequent encounter     1.  Allergen avoidance measures - pollen, mold, mushroom???  2.  Treat and prevent inflammation:  A.  OTC Nasacort/Rhinocort - 1 spray each nostril 1-7 times per week  3.  If needed:  A.  Albuterol HFA -2 inhalations every 4-6 hours B.  Pataday -1 drop each eye 1 time per day C.  Systane eyedrops multiple times per day D.  Azelastine - 1 spray each nostril 1-2 times per day  4. Avoid all oral antihistamines to prevent more dry eye  5. Blood - mushroom IgE. Food challenge???  6. Consider immunotherapy  7. "Action Plan" for asthma flare:  A. Start Advair 500 - 1 inhalation 2 times per day B. Use Albuterol HFA if needed  8. Return to clinic in 8 weeks or earlier if problem  Mariah [name] has atopic disease giving rise to intermittent inflammation of her airway and eyes and we are going to attempt to have her perform allergen avoidance measures as best as possible.  Whether or not she needs to avoid mushroom is still an open question and will check IgE antibodies directed against mushroom in anticipation of possibly providing her a in clinic food challenge with this food product.  She is very atopic and she would benefit from a course of immunotherapy and I did give her some literature during this visit today.  She is presently considering this option.  I will see her back in this clinic in 8 weeks or earlier if there is a problem.  Jiles Prows, MD Allergy / Immunology [facility]

## 2020-10-21 NOTE — Patient Instructions (Addendum)
  1.  Allergen avoidance measures - pollen, mold, mushroom???  2.  Treat and prevent inflammation:  A.  OTC Nasacort/Rhinocort - 1 spray each nostril 1-7 times per week  3.  If needed:  A.  Albuterol HFA -2 inhalations every 4-6 hours B.  Pataday -1 drop each eye 1 time per day C.  Systane eyedrops multiple times per day D.  Azelastine - 1 spray each nostril 1-2 times per day  4. Avoid all oral antihistamines to prevent more dry eye  5. Blood - mushroom IgE. Food challenge???  6. Consider immunotherapy  7. "Action Plan" for asthma flare:  A. Start Advair 500 - 1 inhalation 2 times per day B. Use Albuterol HFA if needed  8. Return to clinic in 8 weeks or earlier if problem

## 2020-10-22 ENCOUNTER — Telehealth: Payer: Self-pay

## 2020-10-22 ENCOUNTER — Other Ambulatory Visit (HOSPITAL_COMMUNITY): Payer: Self-pay

## 2020-10-22 ENCOUNTER — Encounter: Payer: Self-pay | Admitting: Allergy and Immunology

## 2020-10-22 NOTE — Telephone Encounter (Signed)
Patient called stating her arm from where her intradermals were placed yesterday just started itching today, I informed her to put hydrocortisone cream on and if the itching doesn't subside to give our office a call back.  [name] [phone]

## 2020-10-24 ENCOUNTER — Other Ambulatory Visit: Payer: Self-pay | Admitting: Internal Medicine

## 2020-10-24 ENCOUNTER — Telehealth: Payer: Self-pay

## 2020-10-24 ENCOUNTER — Other Ambulatory Visit (HOSPITAL_COMMUNITY): Payer: Self-pay

## 2020-10-24 ENCOUNTER — Other Ambulatory Visit: Payer: Self-pay

## 2020-10-24 ENCOUNTER — Encounter: Payer: Self-pay | Admitting: Allergy and Immunology

## 2020-10-24 MED ORDER — CLOBETASOL PROPIONATE 0.05 % EX OINT: TOPICAL_OINTMENT | CUTANEOUS | 1 refills | Status: DC

## 2020-10-24 MED ORDER — CLOBETASOL PROPIONATE 0.05 % EX OINT
TOPICAL_OINTMENT | CUTANEOUS | 1 refills | Status: DC
  Filled 2020-10-24: qty 60, 30d supply, fill #0
  Filled 2020-10-25: qty 60, 14d supply, fill #0

## 2020-10-24 NOTE — Progress Notes (Signed)
Please advise she can use ice up 20 minutes at a time for swelling, and I have sent clobetasol topical ointment to be used twice daily for itching until rash resolves from previous skin testing.   She has been advised not to take oral antihistamines due to glaucoma, so we will treat topically.

## 2020-10-24 NOTE — Telephone Encounter (Signed)
Patient called back and she is still itching and now on her back. I spoke with Diandra and she said she would call her right back to discuss this matter.

## 2020-10-24 NOTE — Telephone Encounter (Signed)
Called patient back per Dr.Kienna Moncada and she plans to pick up the clobetasol ointment  from [place] later today. She will also use an ice pack for swelling for  up to 20 minutes. She is sending pictures through my chart for review. Patient had an office visit 10/21/20 and had intradermals done. She still has severe itching on her left arm and back from allergy testing. She have been advised to Avoid all oral antihistamines to prevent more dry eye per Dr. Neldon Mc. 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

## 2020-10-25 ENCOUNTER — Other Ambulatory Visit (HOSPITAL_COMMUNITY): Payer: Self-pay

## 2020-10-29 DIAGNOSIS — H40002 Preglaucoma, unspecified, left eye: Secondary | ICD-10-CM | POA: Diagnosis not present

## 2020-10-29 DIAGNOSIS — H401111 Primary open-angle glaucoma, right eye, mild stage: Secondary | ICD-10-CM | POA: Diagnosis not present

## 2020-10-29 DIAGNOSIS — H35033 Hypertensive retinopathy, bilateral: Secondary | ICD-10-CM | POA: Diagnosis not present

## 2020-10-29 DIAGNOSIS — E113293 Type 2 diabetes mellitus with mild nonproliferative diabetic retinopathy without macular edema, bilateral: Secondary | ICD-10-CM | POA: Diagnosis not present

## 2020-10-29 LAB — ALLERGEN, MUSHROOM, RF212: Mushroom IgE: <0.1 kU/L

## 2020-11-04 ENCOUNTER — Telehealth: Payer: Self-pay | Admitting: Allergy and Immunology

## 2020-11-04 NOTE — Telephone Encounter (Signed)
Patient returned Ashleigh's call. I read out to her Dr. Bruna Potter message:   "Please let [name] know that her blood test has returned and there are no IgE antibodies against mushroom suggesting that allergic disease may not be present directed against this food at this point in time."  Patient verbalized understanding.

## 2020-11-04 NOTE — Telephone Encounter (Signed)
Thank you :)

## 2020-11-13 ENCOUNTER — Telehealth: Payer: Self-pay | Admitting: *Deleted

## 2020-11-13 NOTE — Telephone Encounter (Signed)
Patient called and left message in triage voicemail requesting Dexa order faxed to [facility]. Last dexa done at our office in 2014 told to repeat in 2 years. I asked her to call me to remind her she had dexa done here.

## 2020-11-14 ENCOUNTER — Other Ambulatory Visit (HOSPITAL_COMMUNITY): Payer: Self-pay

## 2020-11-15 ENCOUNTER — Other Ambulatory Visit (HOSPITAL_COMMUNITY): Payer: Self-pay

## 2020-11-17 DIAGNOSIS — Z6836 Body mass index (BMI) 36.0-36.9, adult: Secondary | ICD-10-CM | POA: Diagnosis not present

## 2020-11-17 DIAGNOSIS — L7682 Other postprocedural complications of skin and subcutaneous tissue: Secondary | ICD-10-CM | POA: Diagnosis not present

## 2020-11-17 DIAGNOSIS — K430 Incisional hernia with obstruction, without gangrene: Secondary | ICD-10-CM | POA: Diagnosis not present

## 2020-11-21 ENCOUNTER — Ambulatory Visit: Payer: Self-pay | Attending: Internal Medicine

## 2020-11-21 ENCOUNTER — Other Ambulatory Visit (HOSPITAL_BASED_OUTPATIENT_CLINIC_OR_DEPARTMENT_OTHER): Payer: Self-pay

## 2020-11-21 DIAGNOSIS — Z23 Encounter for immunization: Secondary | ICD-10-CM

## 2020-11-21 MED ORDER — PFIZER COVID-19 VAC BIVALENT 30 MCG/0.3ML IM SUSP
INTRAMUSCULAR | 0 refills | Status: DC
  Filled 2020-11-21: qty 0.3, 1d supply, fill #0

## 2020-11-21 NOTE — Progress Notes (Signed)
   Covid-19 Vaccination Clinic  Name:  LIS SAVITT    MRN: [id number] DOB: [birth year]  11/21/2020  Ms. Comma-Watson was observed post Covid-19 immunization for 15 minutes without incident. She was provided with Vaccine Information Sheet and instruction to access the V-Safe system.   Ms. Silverstein was instructed to call 911 with any severe reactions post vaccine: Difficulty breathing  Swelling of face and throat  A fast heartbeat  A bad rash all over body  Dizziness and weakness   Immunizations Administered     Name Date Dose VIS Date Route   Pfizer Covid-19 Vaccine Bivalent Booster 11/21/2020  1:21 PM 0.3 mL 09/03/2020 Intramuscular   Manufacturer: [place]   Lot: [id number]   [place]: [phone]

## 2020-11-25 ENCOUNTER — Other Ambulatory Visit (HOSPITAL_COMMUNITY): Payer: Self-pay

## 2020-11-25 MED ORDER — PANTOPRAZOLE SODIUM 40 MG PO TBEC
DELAYED_RELEASE_TABLET | ORAL | 2 refills | Status: DC
  Filled 2020-11-25: qty 30, 30d supply, fill #0
  Filled 2020-12-25: qty 30, 30d supply, fill #1
  Filled 2021-01-15: qty 30, 30d supply, fill #2

## 2020-11-26 ENCOUNTER — Other Ambulatory Visit (HOSPITAL_COMMUNITY): Payer: Self-pay

## 2020-11-26 MED ORDER — METHYLPHENIDATE HCL ER (OSM) 27 MG PO TBCR
EXTENDED_RELEASE_TABLET | ORAL | 0 refills | Status: DC
  Filled 2020-11-26: qty 30, 30d supply, fill #0

## 2020-12-02 DIAGNOSIS — K430 Incisional hernia with obstruction, without gangrene: Secondary | ICD-10-CM | POA: Diagnosis not present

## 2020-12-02 DIAGNOSIS — T148XXA Other injury of unspecified body region, initial encounter: Secondary | ICD-10-CM | POA: Diagnosis not present

## 2020-12-08 ENCOUNTER — Other Ambulatory Visit: Payer: Self-pay | Admitting: Surgery

## 2020-12-08 DIAGNOSIS — K430 Incisional hernia with obstruction, without gangrene: Secondary | ICD-10-CM

## 2020-12-08 DIAGNOSIS — T148XXA Other injury of unspecified body region, initial encounter: Secondary | ICD-10-CM

## 2020-12-15 DIAGNOSIS — G4733 Obstructive sleep apnea (adult) (pediatric): Secondary | ICD-10-CM | POA: Diagnosis not present

## 2020-12-18 ENCOUNTER — Other Ambulatory Visit (HOSPITAL_COMMUNITY): Payer: Self-pay

## 2020-12-19 ENCOUNTER — Other Ambulatory Visit (HOSPITAL_COMMUNITY): Payer: Self-pay

## 2020-12-22 DIAGNOSIS — Z8719 Personal history of other diseases of the digestive system: Secondary | ICD-10-CM | POA: Diagnosis not present

## 2020-12-22 DIAGNOSIS — T148XXA Other injury of unspecified body region, initial encounter: Secondary | ICD-10-CM | POA: Diagnosis not present

## 2020-12-25 ENCOUNTER — Other Ambulatory Visit: Payer: Self-pay

## 2020-12-25 ENCOUNTER — Ambulatory Visit
Admission: RE | Admit: 2020-12-25 | Discharge: 2020-12-25 | Disposition: A | Discharge status: Home / Self Care | Payer: Self-pay | Source: Ambulatory Visit | Attending: Surgery | Admitting: Surgery

## 2020-12-25 ENCOUNTER — Other Ambulatory Visit (HOSPITAL_COMMUNITY): Payer: Self-pay

## 2020-12-25 DIAGNOSIS — T148XXA Other injury of unspecified body region, initial encounter: Secondary | ICD-10-CM

## 2020-12-25 DIAGNOSIS — K429 Umbilical hernia without obstruction or gangrene: Secondary | ICD-10-CM | POA: Diagnosis not present

## 2020-12-25 DIAGNOSIS — D259 Leiomyoma of uterus, unspecified: Secondary | ICD-10-CM | POA: Diagnosis not present

## 2020-12-25 DIAGNOSIS — K5730 Diverticulosis of large intestine without perforation or abscess without bleeding: Secondary | ICD-10-CM | POA: Diagnosis not present

## 2020-12-25 DIAGNOSIS — K439 Ventral hernia without obstruction or gangrene: Secondary | ICD-10-CM | POA: Diagnosis not present

## 2020-12-25 DIAGNOSIS — K760 Fatty (change of) liver, not elsewhere classified: Secondary | ICD-10-CM | POA: Diagnosis not present

## 2020-12-25 DIAGNOSIS — K430 Incisional hernia with obstruction, without gangrene: Secondary | ICD-10-CM

## 2020-12-25 MED ORDER — IOPAMIDOL (ISOVUE-300) INJECTION 61%
100.0000 mL | Freq: Once | INTRAVENOUS | Status: AC | PRN
  Administered 2020-12-25: 15:00:00 100 mL via INTRAVENOUS

## 2020-12-30 ENCOUNTER — Ambulatory Visit (INDEPENDENT_AMBULATORY_CARE_PROVIDER_SITE_OTHER): Payer: 59 | Admitting: Allergy and Immunology

## 2020-12-30 ENCOUNTER — Other Ambulatory Visit: Payer: Self-pay

## 2020-12-30 VITALS — BP 120/72 | HR 107 | Temp 97.1°F | Resp 16 | Ht 60.0 in | Wt 188.0 lb

## 2020-12-30 DIAGNOSIS — H04123 Dry eye syndrome of bilateral lacrimal glands: Secondary | ICD-10-CM

## 2020-12-30 DIAGNOSIS — J301 Allergic rhinitis due to pollen: Secondary | ICD-10-CM

## 2020-12-30 DIAGNOSIS — H1013 Acute atopic conjunctivitis, bilateral: Secondary | ICD-10-CM | POA: Diagnosis not present

## 2020-12-30 DIAGNOSIS — J4520 Mild intermittent asthma, uncomplicated: Secondary | ICD-10-CM | POA: Diagnosis not present

## 2020-12-30 DIAGNOSIS — H1010 Acute atopic conjunctivitis, unspecified eye: Secondary | ICD-10-CM

## 2020-12-30 DIAGNOSIS — J3089 Other allergic rhinitis: Secondary | ICD-10-CM | POA: Diagnosis not present

## 2020-12-30 NOTE — Patient Instructions (Signed)
°  1.  Allergen avoidance measures - pollen, mold  2.  Treat and prevent inflammation:  A.  OTC Nasacort/Rhinocort - 1 spray each nostril 1-7 times per week  3.  If needed:  A.  Albuterol HFA -2 inhalations every 4-6 hours B.  Pataday -1 drop each eye 1 time per day C.  Systane eyedrops multiple times per day D.  Azelastine - 1 spray each nostril 1-2 times per day  4. Avoid all oral antihistamines to prevent more problems with dry eye  5. "Action Plan" for asthma flare:  A. Start Advair 500 - 1 inhalation 2 times per day B. Use Albuterol HFA if needed  6. Return to clinic in 6 months or earlier if problem

## 2020-12-30 NOTE — Progress Notes (Signed)
Mariah [name]   Follow-up Note  Referring Provider: Maury Dus, MD Primary Provider: Maury Dus, MD Date of Office Visit: 12/30/2020  Subjective:   Mariah [name] (DOB: 19-Dec-1960) is a 60 y.o. 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  Her last visit in this clinic was her initial evaluation of 21 October 2020.  Overall she has done very well with her airway since her last visit.  She has had no issues with asthma and has not had to use a short acting bronchodilator.  She has been using her nasal steroid about 1 or 2 times per week which is working very well for her nose and she has not precipitated any epistaxis.  She has eliminated the use of oral antihistamines which appears to make her dry eye issue a little bit better.  Allergies as of 12/30/2020       Reactions   Codeine Hives, Swelling   Other Other (See Comments)   Flonase make patient's nose bleed.   Seasonal Ic [cholestatin]         Medication List    ASPIRIN 81 PO Take 81 mg by mouth daily.   Azelastine HCl 137 MCG/SPRAY Soln Place 1 spray each nostril 1-2 times daily.   Biotin 10000 MCG Tabs Take 1,000 mcg by mouth daily.   brimonidine 0.1 % Soln Commonly known as: ALPHAGAN P Place into both eyes 2 (two) times daily.   Concerta 27 MG CR tablet Generic drug: methylphenidate Take 1 tablet by mouth once a day in the morning   DULoxetine 60 MG capsule Commonly known as: CYMBALTA Take 1 capsule by mouth once daily   fluconazole 100 MG tablet Commonly known as: DIFLUCAN As needed   fluticasone 50 MCG/ACT nasal spray Commonly known as: FLONASE Flonase Allergy Relief 50 mcg/actuation nasal spray,suspension  Spray 1 spray every day by  intranasal route as directed for 10 days.   fluticasone-salmeterol 500-50 MCG/ACT Aepb Commonly known as: Advair Diskus Inhale 1 puff into the lungs 2 (two) times daily.   Advair Diskus 500-50 MCG/ACT Aepb Generic drug: fluticasone-salmeterol Inhale 1 puff into the lungs in the morning and at bedtime.   FreeStyle Libre 14 Day Reader Kerrin Mo 1 Device by [employer].(Non-Drug; Combo Route) route continuous.   FreeStyle Libre 14 Day Sensor Misc FreeStyle Libre 14 Day Sensor kit  PLACE 1 PATCH EVERY 14 DAYS TO USE WITH READER   FreeStyle Libre 2 Sensor Misc Inject 1 sensor to the skin every 14 days for continuous glucose monitoring.   glycopyrrolate 1 MG tablet Commonly known as: ROBINUL Take by mouth.   hydrocortisone 2.5 % cream Apply 1 application topically daily.   Insulin Lispro Prot & Lispro (75-25) 100 UNIT/ML Kwikpen Commonly known as: HumaLOG Mix 75/25 KwikPen Inject 90 units into the skin before breakfast and 60 units before dinner.   lisinopril 20 MG tablet Commonly known as: ZESTRIL Take 1 tablet by mouth every morning   olopatadine 0.1 % ophthalmic solution Commonly known as: PATANOL Place 1 drop into both eyes 2 (two) times daily.   pantoprazole 40 MG tablet Commonly known as: PROTONIX Take 1 tablet by mouth every morning.   Pfizer COVID-19 Vac Bivalent injection Generic drug: COVID-19 mRNA bivalent vaccine Therapist, music) Inject into the muscle.  pravastatin 40 MG tablet Commonly known as: PRAVACHOL Take 1 tablet by mouth at bedtime   Synjardy 12.05-998 MG Tabs Generic drug: Empagliflozin-metFORMIN HCl Take 1 tablet by mouth 2 times daily.   triamcinolone cream 0.1 % Commonly known as: KENALOG Apply to affected areas 2 times a day as needed   Trulicity 4.5 BZ/1.6RC Sopn Generic drug: Dulaglutide   Global Ease Inject Pen Needles 31G X 8 MM Misc Generic drug: Insulin Pen Needle USE TO INJECT INSULIN TWICE DAILY   Unifine Pentips 32G X 4 MM Misc Generic  drug: Insulin Pen Needle Use to inject insulin twice daily   Ventolin HFA 108 (90 Base) MCG/ACT inhaler Generic drug: albuterol Inhale 1-2 puffs into the lungs every 6 (six) hours as needed for wheezing or shortness of breath.   zolpidem 10 MG tablet Commonly known as: AMBIEN Take 1/2-1 tablet by mouth at bedtime as needed    Past Medical History:  Diagnosis Date   Allergy    Anemia    Asthma    Body odor    from colostomy takedown   Cervicalgia    Depression    Diabetes mellitus without complication (HCC)    Fibroid    GERD (gastroesophageal reflux disease)    Hyperlipidemia    Hypertension    Hypothyroidism    Infertility    OSA (obstructive sleep apnea)    upper airway resistance syndrome on CPAP at 9cm H2O   Reflux    Vertigo     Past Surgical History:  Procedure Laterality Date   ABDOMINAL SURGERY  2011   PERFORATED DIVERTICULUM   COLON SURGERY  2012   sigmoid colectomy for perforated sigmoid diverticulitis   COLOSTOMY     TEMPORARY   COLOSTOMY TAKEDOWN     TONSILLECTOMY     VENTRAL HERNIA REPAIR  2012    Review of systems negative except as noted in HPI / PMHx or noted below:  Review of Systems  Constitutional: Negative.   HENT: Negative.    Eyes: Negative.   Respiratory: Negative.    Cardiovascular: Negative.   Gastrointestinal: Negative.   Genitourinary: Negative.   Musculoskeletal: Negative.   Skin: Negative.   Neurological: Negative.   Endo/Heme/Allergies: Negative.   Psychiatric/Behavioral: Negative.      Objective:   Vitals:   12/30/20 1808  BP: 120/72  Pulse: (!) 107  Resp: 16  Temp: (!) 97.1 F (36.2 C)  SpO2: 99%   Height: 5' (152.4 cm)  Weight: 188 lb (85.3 kg)   Physical Exam Constitutional:      Appearance: She is not diaphoretic.  HENT:     Head: Normocephalic.     Right Ear: Tympanic membrane, ear canal and external ear normal.     Left Ear: Tympanic membrane, ear canal and external ear normal.     Nose: Nose  normal. No mucosal edema or rhinorrhea.     Mouth/Throat:     Pharynx: Uvula midline. No oropharyngeal exudate.  Eyes:     Conjunctiva/sclera: Conjunctivae normal.  Neck:     Thyroid: No thyromegaly.     Trachea: Trachea normal. No tracheal tenderness or tracheal deviation.  Cardiovascular:     Rate and Rhythm: Normal rate and regular rhythm.     Heart sounds: Normal heart sounds, S1 normal and S2 normal. No murmur heard. Pulmonary:     Effort: No respiratory distress.     Breath sounds: Normal breath sounds. No stridor. No wheezing or rales.  Lymphadenopathy:  Head:     Right side of head: No tonsillar adenopathy.     Left side of head: No tonsillar adenopathy.     Cervical: No cervical adenopathy.  Skin:    Findings: No erythema or rash.     Nails: There is no clubbing.  Neurological:     Mental Status: She is alert.    Diagnostics:   The patient had an Asthma Control Test with the following results: ACT Total Score: 25.    Results of blood tests obtained 21 October 2020 identified mushroom IgE less than 0.10 KU/L.  Assessment and Plan:   1. Asthma, mild intermittent, well-controlled   2. Perennial allergic rhinitis   3. Seasonal allergic rhinitis due to pollen   4. Seasonal allergic conjunctivitis   5. Dry eye syndrome of both eyes     1.  Allergen avoidance measures - pollen, mold  2.  Treat and prevent inflammation:  A.  OTC Nasacort/Rhinocort - 1 spray each nostril 1-7 times per week  3.  If needed:  A.  Albuterol HFA -2 inhalations every 4-6 hours B.  Pataday -1 drop each eye 1 time per day C.  Systane eyedrops multiple times per day D.  Azelastine - 1 spray each nostril 1-2 times per day  4. Avoid all oral antihistamines to prevent more problems with dry eye  5. "Action Plan" for asthma flare:  A. Start Advair 500 - 1 inhalation 2 times per day B. Use Albuterol HFA if needed  6. Return to clinic in 6 months or earlier if problem  Mariah [name] appears  to be doing pretty well and hopefully if she goes through the spring season while attempting to perform allergen avoidance measures and using some anti-inflammatory agents for her airway she will continue to do well.  She does have a "action plan" to initiate should she develop problems with her lower airways as she moves through the pollen season.  Assuming she does well with this plan I will see her back in this clinic in 6 months or earlier if there is a problem.  Allena Katz, MD [facility]

## 2020-12-31 ENCOUNTER — Ambulatory Visit: Payer: 59 | Admitting: Podiatry

## 2020-12-31 ENCOUNTER — Encounter: Payer: Self-pay | Admitting: Podiatry

## 2020-12-31 ENCOUNTER — Other Ambulatory Visit (HOSPITAL_COMMUNITY): Payer: Self-pay

## 2020-12-31 ENCOUNTER — Encounter: Payer: Self-pay | Admitting: Allergy and Immunology

## 2020-12-31 DIAGNOSIS — L309 Dermatitis, unspecified: Secondary | ICD-10-CM

## 2020-12-31 DIAGNOSIS — M21619 Bunion of unspecified foot: Secondary | ICD-10-CM

## 2020-12-31 MED ORDER — ALBUTEROL SULFATE HFA 108 (90 BASE) MCG/ACT IN AERS
1.0000 | INHALATION_SPRAY | Freq: Four times a day (QID) | RESPIRATORY_TRACT | 1 refills | Status: DC | PRN
  Filled 2020-12-31: qty 18, 25d supply, fill #0
  Filled 2021-06-20: qty 18, 25d supply, fill #1

## 2020-12-31 MED ORDER — SYSTANE 0.4-0.3 % OP SOLN
1.0000 [drp] | OPHTHALMIC | 5 refills | Status: DC | PRN
  Filled 2020-12-31: qty 30, fill #0
  Filled 2021-04-14: qty 30, 30d supply, fill #0
  Filled 2021-08-20: qty 30, fill #0

## 2020-12-31 MED ORDER — OLOPATADINE HCL 0.1 % OP SOLN
1.0000 [drp] | Freq: Two times a day (BID) | OPHTHALMIC | 5 refills | Status: DC
  Filled 2020-12-31: qty 5, 20d supply, fill #0
  Filled 2021-04-14: qty 5, 20d supply, fill #1
  Filled 2021-07-23: qty 5, 20d supply, fill #2
  Filled 2021-08-20: qty 5, 20d supply, fill #3
  Filled 2021-09-16: qty 5, 20d supply, fill #4

## 2020-12-31 MED ORDER — ADVAIR DISKUS 500-50 MCG/ACT IN AEPB
1.0000 | INHALATION_SPRAY | Freq: Two times a day (BID) | RESPIRATORY_TRACT | 5 refills | Status: DC
  Filled 2020-12-31: qty 60, 30d supply, fill #0

## 2020-12-31 MED ORDER — AZELASTINE HCL 0.1 % NA SOLN
NASAL | 5 refills | Status: DC
  Filled 2020-12-31: qty 30, 50d supply, fill #0
  Filled 2021-08-20: qty 30, 50d supply, fill #1

## 2020-12-31 NOTE — Progress Notes (Signed)
Subjective:   Patient ID: [name], female   DOB: 60 y.o.   MRN: [id number]   HPI Patient presents with thickened nailbeds and tissue bilateral which is very dry and long-term diabetes under reasonably good control   ROS      Objective:  Physical Exam  Neurovascular status was found to be intact with multiple signs of dry skin plantarly bilateral and good digital perfusion and did not have any neurological loss     Assessment:  Appears to be more due to just her skin structure and not respond to anything else with good satisfactory neurovascular status after     Plan:  H&P reviewed condition recommended the continuation of conservative care daily inspections but I do not think she needs to be seen as long as her sugar remains under good control and she is doing well overall.  She is encouraged to call us with any questions that may occur

## 2021-01-01 ENCOUNTER — Other Ambulatory Visit (HOSPITAL_COMMUNITY): Payer: Self-pay

## 2021-01-08 ENCOUNTER — Other Ambulatory Visit (HOSPITAL_COMMUNITY): Payer: Self-pay

## 2021-01-09 ENCOUNTER — Other Ambulatory Visit (HOSPITAL_COMMUNITY): Payer: Self-pay

## 2021-01-09 MED ORDER — METHYLPHENIDATE HCL ER (OSM) 27 MG PO TBCR
EXTENDED_RELEASE_TABLET | ORAL | 0 refills | Status: DC
  Filled 2021-01-09: qty 30, 30d supply, fill #0

## 2021-01-09 MED ORDER — METHYLPHENIDATE HCL ER (OSM) 27 MG PO TBCR
EXTENDED_RELEASE_TABLET | ORAL | 0 refills | Status: DC
  Filled 2021-03-24: qty 30, 30d supply, fill #0

## 2021-01-10 ENCOUNTER — Other Ambulatory Visit (HOSPITAL_COMMUNITY): Payer: Self-pay

## 2021-01-15 ENCOUNTER — Other Ambulatory Visit (HOSPITAL_COMMUNITY): Payer: Self-pay

## 2021-01-15 MED ORDER — GLYCOPYRROLATE 1 MG PO TABS
ORAL_TABLET | ORAL | 0 refills | Status: DC
  Filled 2021-01-15: qty 180, 90d supply, fill #0

## 2021-01-15 MED ORDER — DULOXETINE HCL 60 MG PO CPEP
ORAL_CAPSULE | ORAL | 0 refills | Status: DC
  Filled 2021-01-15: qty 90, 90d supply, fill #0

## 2021-01-19 ENCOUNTER — Other Ambulatory Visit (HOSPITAL_COMMUNITY): Payer: Self-pay

## 2021-01-20 DIAGNOSIS — Z1231 Encounter for screening mammogram for malignant neoplasm of breast: Secondary | ICD-10-CM | POA: Diagnosis not present

## 2021-01-21 ENCOUNTER — Encounter: Payer: Self-pay | Admitting: Nurse Practitioner

## 2021-01-23 ENCOUNTER — Other Ambulatory Visit (HOSPITAL_COMMUNITY): Payer: Self-pay

## 2021-01-28 DIAGNOSIS — H40002 Preglaucoma, unspecified, left eye: Secondary | ICD-10-CM | POA: Diagnosis not present

## 2021-01-28 DIAGNOSIS — E119 Type 2 diabetes mellitus without complications: Secondary | ICD-10-CM | POA: Diagnosis not present

## 2021-01-28 DIAGNOSIS — H35033 Hypertensive retinopathy, bilateral: Secondary | ICD-10-CM | POA: Diagnosis not present

## 2021-01-28 DIAGNOSIS — H401111 Primary open-angle glaucoma, right eye, mild stage: Secondary | ICD-10-CM | POA: Diagnosis not present

## 2021-01-30 ENCOUNTER — Other Ambulatory Visit (HOSPITAL_COMMUNITY): Payer: Self-pay

## 2021-01-30 DIAGNOSIS — Z7984 Long term (current) use of oral hypoglycemic drugs: Secondary | ICD-10-CM | POA: Diagnosis not present

## 2021-01-30 DIAGNOSIS — E1129 Type 2 diabetes mellitus with other diabetic kidney complication: Secondary | ICD-10-CM | POA: Diagnosis not present

## 2021-01-30 DIAGNOSIS — E113293 Type 2 diabetes mellitus with mild nonproliferative diabetic retinopathy without macular edema, bilateral: Secondary | ICD-10-CM | POA: Diagnosis not present

## 2021-01-30 DIAGNOSIS — Z794 Long term (current) use of insulin: Secondary | ICD-10-CM | POA: Diagnosis not present

## 2021-01-30 DIAGNOSIS — R809 Proteinuria, unspecified: Secondary | ICD-10-CM | POA: Diagnosis not present

## 2021-01-30 DIAGNOSIS — I10 Essential (primary) hypertension: Secondary | ICD-10-CM | POA: Diagnosis not present

## 2021-01-30 DIAGNOSIS — Z7985 Long-term (current) use of injectable non-insulin antidiabetic drugs: Secondary | ICD-10-CM | POA: Diagnosis not present

## 2021-01-30 DIAGNOSIS — E1165 Type 2 diabetes mellitus with hyperglycemia: Secondary | ICD-10-CM | POA: Diagnosis not present

## 2021-01-30 DIAGNOSIS — E11649 Type 2 diabetes mellitus with hypoglycemia without coma: Secondary | ICD-10-CM | POA: Diagnosis not present

## 2021-01-30 MED ORDER — FREESTYLE LIBRE 3 SENSOR MISC
11 refills | Status: DC
  Filled 2021-01-30: qty 2, 30d supply, fill #0
  Filled 2021-02-16: qty 1, 14d supply, fill #0
  Filled 2021-03-24: qty 2, 28d supply, fill #1
  Filled 2021-04-14: qty 2, 28d supply, fill #2
  Filled 2021-05-25: qty 2, 28d supply, fill #3
  Filled 2021-06-26: qty 2, 28d supply, fill #4
  Filled 2021-07-23: qty 2, 28d supply, fill #5
  Filled 2021-08-20: qty 2, 28d supply, fill #6
  Filled 2021-09-16: qty 2, 28d supply, fill #7
  Filled 2021-10-09: qty 2, 28d supply, fill #8
  Filled 2021-11-12: qty 2, 28d supply, fill #9
  Filled 2021-12-04: qty 2, 28d supply, fill #10
  Filled 2021-12-25: qty 2, 28d supply, fill #11

## 2021-02-05 LAB — LIPID PANEL
Cholesterol: 146 (ref 0–200)
HDL: 29 — AB (ref 35–70)
LDL Cholesterol: 98
Triglycerides: 285 — AB (ref 40–160)

## 2021-02-16 ENCOUNTER — Other Ambulatory Visit (HOSPITAL_COMMUNITY): Payer: Self-pay

## 2021-02-17 ENCOUNTER — Other Ambulatory Visit (HOSPITAL_COMMUNITY): Payer: Self-pay

## 2021-02-20 ENCOUNTER — Other Ambulatory Visit (HOSPITAL_COMMUNITY): Payer: Self-pay

## 2021-02-20 MED ORDER — PANTOPRAZOLE SODIUM 40 MG PO TBEC
40.0000 mg | DELAYED_RELEASE_TABLET | Freq: Every morning | ORAL | 0 refills | Status: DC
  Filled 2021-02-20: qty 30, 30d supply, fill #0

## 2021-02-21 ENCOUNTER — Other Ambulatory Visit (HOSPITAL_COMMUNITY): Payer: Self-pay

## 2021-02-21 MED ORDER — METHYLPHENIDATE HCL ER (OSM) 27 MG PO TBCR
EXTENDED_RELEASE_TABLET | ORAL | 0 refills | Status: DC
Start: 2021-02-20 — End: 2021-10-20
  Filled 2021-02-21: qty 30, 30d supply, fill #0

## 2021-02-23 ENCOUNTER — Other Ambulatory Visit (HOSPITAL_COMMUNITY): Payer: Self-pay

## 2021-02-23 DIAGNOSIS — Z0000 Encounter for general adult medical examination without abnormal findings: Secondary | ICD-10-CM | POA: Diagnosis not present

## 2021-02-23 DIAGNOSIS — E782 Mixed hyperlipidemia: Secondary | ICD-10-CM | POA: Diagnosis not present

## 2021-02-23 DIAGNOSIS — M79674 Pain in right toe(s): Secondary | ICD-10-CM | POA: Diagnosis not present

## 2021-02-23 DIAGNOSIS — Z1159 Encounter for screening for other viral diseases: Secondary | ICD-10-CM | POA: Diagnosis not present

## 2021-02-25 ENCOUNTER — Other Ambulatory Visit (HOSPITAL_COMMUNITY): Payer: Self-pay

## 2021-02-25 DIAGNOSIS — I10 Essential (primary) hypertension: Secondary | ICD-10-CM | POA: Diagnosis not present

## 2021-02-25 DIAGNOSIS — E1165 Type 2 diabetes mellitus with hyperglycemia: Secondary | ICD-10-CM | POA: Diagnosis not present

## 2021-02-25 DIAGNOSIS — G4700 Insomnia, unspecified: Secondary | ICD-10-CM | POA: Diagnosis not present

## 2021-02-25 DIAGNOSIS — J4520 Mild intermittent asthma, uncomplicated: Secondary | ICD-10-CM | POA: Diagnosis not present

## 2021-02-25 DIAGNOSIS — F419 Anxiety disorder, unspecified: Secondary | ICD-10-CM | POA: Diagnosis not present

## 2021-02-25 DIAGNOSIS — F900 Attention-deficit hyperactivity disorder, predominantly inattentive type: Secondary | ICD-10-CM | POA: Diagnosis not present

## 2021-02-25 DIAGNOSIS — E782 Mixed hyperlipidemia: Secondary | ICD-10-CM | POA: Diagnosis not present

## 2021-02-25 DIAGNOSIS — J309 Allergic rhinitis, unspecified: Secondary | ICD-10-CM | POA: Diagnosis not present

## 2021-02-25 DIAGNOSIS — Z0000 Encounter for general adult medical examination without abnormal findings: Secondary | ICD-10-CM | POA: Diagnosis not present

## 2021-02-25 MED ORDER — LISINOPRIL 20 MG PO TABS
ORAL_TABLET | ORAL | 2 refills | Status: DC
  Filled 2021-04-14: qty 90, 90d supply, fill #0
  Filled 2021-07-23: qty 90, 90d supply, fill #1

## 2021-02-25 MED ORDER — PANTOPRAZOLE SODIUM 40 MG PO TBEC
DELAYED_RELEASE_TABLET | ORAL | 3 refills | Status: DC
  Filled 2021-03-24: qty 90, 90d supply, fill #0
  Filled 2021-04-14 – 2021-06-26 (×2): qty 90, 90d supply, fill #1
  Filled 2021-11-12: qty 90, 90d supply, fill #2
  Filled 2022-02-22: qty 90, 90d supply, fill #3

## 2021-02-25 MED ORDER — PRAVASTATIN SODIUM 40 MG PO TABS
ORAL_TABLET | ORAL | 3 refills | Status: DC
  Filled 2021-02-25 – 2021-03-05 (×2): qty 90, 90d supply, fill #0
  Filled 2021-05-25: qty 90, 90d supply, fill #1
  Filled 2021-08-20: qty 90, 90d supply, fill #2
  Filled 2021-12-04: qty 90, 90d supply, fill #3

## 2021-02-25 MED ORDER — FLUTICASONE PROPIONATE 50 MCG/ACT NA SUSP: NASAL | 11 refills | Status: DC

## 2021-02-25 MED ORDER — ZOLPIDEM TARTRATE 10 MG PO TABS
ORAL_TABLET | ORAL | 0 refills | Status: AC
  Filled 2021-08-20: qty 30, 30d supply, fill #0

## 2021-02-25 MED ORDER — DULOXETINE HCL 60 MG PO CPEP
ORAL_CAPSULE | ORAL | 2 refills | Status: DC
  Filled 2021-04-14: qty 90, 90d supply, fill #0
  Filled 2021-07-23: qty 90, 90d supply, fill #1
  Filled 2021-08-20 – 2021-10-09 (×2): qty 90, 90d supply, fill #2

## 2021-02-25 MED ORDER — METHYLPHENIDATE HCL ER (OSM) 27 MG PO TBCR
EXTENDED_RELEASE_TABLET | ORAL | 0 refills | Status: DC
  Filled 2021-04-14: qty 90, fill #0
  Filled 2021-05-04: qty 90, 90d supply, fill #0

## 2021-02-27 ENCOUNTER — Other Ambulatory Visit (HOSPITAL_COMMUNITY): Payer: Self-pay

## 2021-03-05 ENCOUNTER — Other Ambulatory Visit (HOSPITAL_COMMUNITY): Payer: Self-pay

## 2021-03-05 ENCOUNTER — Encounter: Payer: Self-pay | Admitting: Surgery

## 2021-03-06 ENCOUNTER — Other Ambulatory Visit (HOSPITAL_COMMUNITY): Payer: Self-pay

## 2021-03-06 DIAGNOSIS — M85851 Other specified disorders of bone density and structure, right thigh: Secondary | ICD-10-CM | POA: Diagnosis not present

## 2021-03-06 DIAGNOSIS — Z780 Asymptomatic menopausal state: Secondary | ICD-10-CM | POA: Diagnosis not present

## 2021-03-07 ENCOUNTER — Other Ambulatory Visit (HOSPITAL_COMMUNITY): Payer: Self-pay

## 2021-03-09 ENCOUNTER — Encounter: Payer: Self-pay | Admitting: Nurse Practitioner

## 2021-03-16 DIAGNOSIS — G4733 Obstructive sleep apnea (adult) (pediatric): Secondary | ICD-10-CM | POA: Diagnosis not present

## 2021-03-23 NOTE — Progress Notes (Signed)
? ?  Mariah [name] [birth year] [id number] ? ? ?History:  61 y.o. G0 presents for annual exam without GYN complaints. Postmenopausal - no HRT, no bleeding. Normal pap and mammogram history. HTN, HLD, asthma, and hypothyroidism managed by PCP, T2DM managed by endocrinology. History of vaginal yeast infections s/t diabetic medications. Has not had to use Diflucan much this year.  ? ?Gynecologic History ?No LMP recorded. Patient is postmenopausal. ?  ?Contraception/Family planning: post menopausal status ?Sexually active: No ? ?Health Maintenance ?Last Pap: 02/07/2019. Results were: Normal, 5-year repeat ?Last mammogram: 01/21/2021. Results were: Normal ?Last colonoscopy: 2012 ?Last Dexa: 03/09/2021. Results were: T-score -1.4, FRAX 14 % / 0.5% ? ?Past medical history, past surgical history, family history and social history were all reviewed and documented in the EPIC chart. Married. Works in Orthoptist for [employer].  ? ?ROS:  A ROS was performed and pertinent positives and negatives are included. ? ?Exam: ? ?Vitals:  ? 03/24/21 0813  ?BP: 124/82  ?Weight: 185 lb (83.9 kg)  ?Height: 5' (1.524 m)  ? ? ?Body mass index is 36.13 kg/m?. ? ?General appearance:  Normal ?Thyroid:  Symmetrical, normal in size, without palpable masses or nodularity. ?Respiratory ? Auscultation:  Clear without wheezing or rhonchi ?Cardiovascular ? Auscultation:  Regular rate, without rubs, murmurs or gallops ? Edema/varicosities:  Not grossly evident ?Abdominal ? Soft,nontender, without masses, guarding or rebound. ? Liver/spleen:  No organomegaly noted ? Hernia:  None appreciated ? Skin ? Inspection:  Grossly normal ?  ?Breasts: Examined lying and sitting.  ? Right: Without masses, retractions, discharge or axillary adenopathy. ? ? Left: Without masses, retractions, discharge or axillary adenopathy. ?Genitourinary  ? Inguinal/mons:  Normal without inguinal adenopathy ? External genitalia:  Normal appearing vulva with no masses, tenderness, or  lesions ? BUS/Urethra/Skene's glands:  Normal ? Vagina:  Normal appearing with normal color and discharge, no lesions ? Cervix:  Normal appearing without discharge or lesions ? Uterus:  Difficult to palpate due to body habitus and large hernia but no gross masses or tenderness ? Adnexa/parametria:   ?  Rt: Normal in size, without masses or tenderness. ?  Lt: Normal in size, without masses or tenderness. ? Anus and perineum: Normal ? Digital rectal exam: Normal sphincter tone without palpated masses or tenderness ? ?Patient informed chaperone available to be present for breast and pelvic exam. Patient has requested no chaperone to be present. Patient has been advised what will be completed during breast and pelvic exam.  ? ?Assessment/Plan:  61 y.o. G0 for annual exam.  ? ?Well female exam with routine gynecological exam - Education provided on SBEs, importance of preventative screenings, current guidelines, high calcium diet, regular exercise, and multivitamin daily. Labs with PCP and endocrinology.  ? ?Postmenopausal - denies menopausal symptoms. No HRT, no bleeding.  ? ?Osteopenia of neck of right femur - T-score -1.4 without elevated FRAX. Continue Vitamin D + Calcium and increase exercise. Will repeat DXA at 2-year interval per recommendations.  ? ?Screening for cervical cancer - Normal Pap history.  Will repeat at 5-year interval per guidelines. ? ?Screening for breast cancer - Normal mammogram history.  Continue annual screenings.  Normal breast exam today. ? ?Screening for colon cancer - 2012 colonoscopy. Overdue and plans to call to schedule.  ? ?Return in 1 year for annual.  ? ? ? ? ?[place], 8:36 AM 03/24/2021 ? ?

## 2021-03-24 ENCOUNTER — Other Ambulatory Visit (HOSPITAL_COMMUNITY): Payer: Self-pay

## 2021-03-24 ENCOUNTER — Encounter: Payer: Self-pay | Admitting: Nurse Practitioner

## 2021-03-24 ENCOUNTER — Other Ambulatory Visit: Payer: Self-pay

## 2021-03-24 ENCOUNTER — Ambulatory Visit (INDEPENDENT_AMBULATORY_CARE_PROVIDER_SITE_OTHER): Payer: 59 | Admitting: Nurse Practitioner

## 2021-03-24 VITALS — BP 124/82 | Ht 60.0 in | Wt 185.0 lb

## 2021-03-24 DIAGNOSIS — M85851 Other specified disorders of bone density and structure, right thigh: Secondary | ICD-10-CM

## 2021-03-24 DIAGNOSIS — Z780 Asymptomatic menopausal state: Secondary | ICD-10-CM | POA: Diagnosis not present

## 2021-03-24 DIAGNOSIS — Z01419 Encounter for gynecological examination (general) (routine) without abnormal findings: Secondary | ICD-10-CM

## 2021-03-25 ENCOUNTER — Other Ambulatory Visit (HOSPITAL_COMMUNITY): Payer: Self-pay

## 2021-03-25 MED ORDER — UNIFINE PENTIPS 32G X 4 MM MISC
0 refills | Status: DC
  Filled 2021-03-25: qty 100, 40d supply, fill #0
  Filled 2021-03-25: qty 100, 50d supply, fill #0
  Filled 2021-05-25: qty 200, 90d supply, fill #1

## 2021-03-26 ENCOUNTER — Other Ambulatory Visit (HOSPITAL_COMMUNITY): Payer: Self-pay

## 2021-03-26 MED ORDER — UNIFINE PENTIPS 31G X 8 MM MISC
3 refills | Status: DC
  Filled 2021-03-26: qty 200, 90d supply, fill #0

## 2021-03-27 ENCOUNTER — Other Ambulatory Visit (HOSPITAL_COMMUNITY): Payer: Self-pay

## 2021-03-31 DIAGNOSIS — E119 Type 2 diabetes mellitus without complications: Secondary | ICD-10-CM | POA: Diagnosis not present

## 2021-03-31 DIAGNOSIS — H401111 Primary open-angle glaucoma, right eye, mild stage: Secondary | ICD-10-CM | POA: Diagnosis not present

## 2021-03-31 DIAGNOSIS — H00025 Hordeolum internum left lower eyelid: Secondary | ICD-10-CM | POA: Diagnosis not present

## 2021-03-31 DIAGNOSIS — H35033 Hypertensive retinopathy, bilateral: Secondary | ICD-10-CM | POA: Diagnosis not present

## 2021-03-31 DIAGNOSIS — H40002 Preglaucoma, unspecified, left eye: Secondary | ICD-10-CM | POA: Diagnosis not present

## 2021-03-31 DIAGNOSIS — H02889 Meibomian gland dysfunction of unspecified eye, unspecified eyelid: Secondary | ICD-10-CM | POA: Diagnosis not present

## 2021-04-06 ENCOUNTER — Other Ambulatory Visit (HOSPITAL_COMMUNITY): Payer: Self-pay

## 2021-04-07 DIAGNOSIS — K430 Incisional hernia with obstruction, without gangrene: Secondary | ICD-10-CM | POA: Diagnosis not present

## 2021-04-07 DIAGNOSIS — Z6836 Body mass index (BMI) 36.0-36.9, adult: Secondary | ICD-10-CM | POA: Diagnosis not present

## 2021-04-07 DIAGNOSIS — E119 Type 2 diabetes mellitus without complications: Secondary | ICD-10-CM | POA: Diagnosis not present

## 2021-04-14 ENCOUNTER — Other Ambulatory Visit (HOSPITAL_COMMUNITY): Payer: Self-pay

## 2021-04-15 ENCOUNTER — Other Ambulatory Visit (HOSPITAL_COMMUNITY): Payer: Self-pay

## 2021-04-15 MED ORDER — GLYCOPYRROLATE 1 MG PO TABS
ORAL_TABLET | ORAL | 1 refills | Status: DC
  Filled 2021-04-15: qty 180, 90d supply, fill #0
  Filled 2021-07-23: qty 180, 90d supply, fill #1

## 2021-04-16 ENCOUNTER — Other Ambulatory Visit (HOSPITAL_COMMUNITY): Payer: Self-pay

## 2021-04-17 ENCOUNTER — Other Ambulatory Visit (HOSPITAL_COMMUNITY): Payer: Self-pay

## 2021-04-20 ENCOUNTER — Other Ambulatory Visit (HOSPITAL_COMMUNITY): Payer: Self-pay

## 2021-04-21 ENCOUNTER — Other Ambulatory Visit (HOSPITAL_COMMUNITY): Payer: Self-pay

## 2021-04-21 MED ORDER — SYNJARDY XR 12.5-1000 MG PO TB24
ORAL_TABLET | ORAL | 2 refills | Status: DC
  Filled 2021-04-21: qty 60, 30d supply, fill #0
  Filled 2021-05-25: qty 60, 30d supply, fill #1
  Filled 2021-06-26: qty 60, 30d supply, fill #2

## 2021-04-22 ENCOUNTER — Other Ambulatory Visit (HOSPITAL_COMMUNITY): Payer: Self-pay

## 2021-04-23 ENCOUNTER — Ambulatory Visit: Payer: Self-pay | Admitting: Surgery

## 2021-04-23 NOTE — Progress Notes (Signed)
Sent message, via epic in basket, requesting orders in epic from surgeon.  

## 2021-04-28 NOTE — Progress Notes (Addendum)
DUE TO COVID-19 ONLY  2 VISITOR IS ALLOWED TO COME WITH YOU AND STAY IN THE WAITING ROOM ONLY DURING PRE OP AND PROCEDURE DAY OF SURGERY.   4 VISITOR  MAY VISIT WITH YOU AFTER SURGERY IN YOUR PRIVATE ROOM DURING VISITING HOURS ONLY! ?YOU MAY HAVE ONE PERSON SPEND THE NITE WITH YOU IN YOUR ROOM AFTER SURGERY.   ? ? Your procedure is scheduled on:  ?05/07/2021  ? Report to [facility] Main  Entrance ? ? Report to admitting at  1245 pm                ?DO NOT BRING INSURANCE CARD, PICTURE ID OR WALLET DAY OF SURGERY.  ?  ? ? Call this number if you have problems the morning of surgery [phone]  ? ? REMEMBER: NO  SOLID FOODS , CANDY, GUM OR MINTS AFTER MIDNITE THE NITE BEFORE SURGERY .       Marland Kitchen CLEAR LIQUIDS UNTIL   1200 noon             DAY OF SURGERY.      PLEASE FINISH  g2 lOWER SUGAR  DRINK PER SURGEON ORDER  WHICH NEEDS TO BE COMPLETED AT  1200 NOON         OF SURGERY.   ? ? ? ? ?CLEAR LIQUID DIET ? ? ?Foods Allowed      ?WATER ?BLACK COFFEE ( SUGAR OK, NO MILK, CREAM OR CREAMER) REGULAR AND DECAF  ?TEA ( SUGAR OK NO MILK, CREAM, OR CREAMER) REGULAR AND DECAF  ?PLAIN JELLO ( NO RED)  ?FRUIT ICES ( NO RED, NO FRUIT PULP)  ?POPSICLES ( NO RED)  ?JUICE- APPLE, WHITE GRAPE AND WHITE CRANBERRY  ?SPORT DRINK LIKE GATORADE ( NO RED)  ?CLEAR BROTH ( VEGETABLE , CHICKEN OR BEEF)                                                               ? ?    ? ?BRUSH YOUR TEETH MORNING OF SURGERY AND RINSE YOUR MOUTH OUT, NO CHEWING GUM CANDY OR MINTS. ?  ? ? Take these medicines the morning of surgery with A SIP OF WATER:  iNHALERS AS USUAL AND BRING,  EYE DROPS AS USUAL , PROTONIX  ? ? ?DO NOT TAKE ANY DIABETIC MEDICATIONS DAY OF YOUR SURGERY ?                  ?            You may not have any metal on your body including hair pins and  ?            piercings  Do not wear jewelry, make-up, lotions, powders or perfumes, deodorant ?            Do not wear nail polish on your fingernails.   ?           IF YOU ARE A FEMALE AND  WANT TO SHAVE UNDER ARMS OR LEGS PRIOR TO SURGERY YOU MUST DO SO AT LEAST 48 HOURS PRIOR TO SURGERY.  ?            Men may shave face and neck. ? ? Do not bring valuables to the hospital. [facility] NOT ?  RESPONSIBLE   FOR VALUABLES. ? Contacts, dentures or bridgework may not be worn into surgery. ? Leave suitcase in the car. After surgery it may be brought to your room. ? ?  ? Patients discharged the day of surgery will not be allowed to drive home. IF YOU ARE HAVING SURGERY AND GOING HOME THE SAME DAY, YOU MUST HAVE AN ADULT TO DRIVE YOU HOME AND BE WITH YOU FOR 24 HOURS. YOU MAY GO HOME BY TAXI OR UBER OR ORTHERWISE, BUT AN ADULT MUST ACCOMPANY YOU HOME AND STAY WITH YOU FOR 24 HOURS. ?  ? ?            Please read over the following fact sheets you were given: ?_____________________________________________________________________ ? ?[facility] - Preparing for Surgery ?Before surgery, you can play an important role.  Because skin is not sterile, your skin needs to be as free of germs as possible.  You can reduce the number of germs on your skin by washing with CHG (chlorahexidine gluconate) soap before surgery.  CHG is an antiseptic cleaner which kills germs and bonds with the skin to continue killing germs even after washing. ?Please DO NOT use if you have an allergy to CHG or antibacterial soaps.  If your skin becomes reddened/irritated stop using the CHG and inform your nurse when you arrive at Short Stay. ?Do not shave (including legs and underarms) for at least 48 hours prior to the first CHG shower.  You may shave your face/neck. ?Please follow these instructions carefully: ? 1.  Shower with CHG Soap the night before surgery and the  morning of Surgery. ? 2.  If you choose to wash your hair, wash your hair first as usual with your  normal  shampoo. ? 3.  After you shampoo, rinse your hair and body thoroughly to remove the  shampoo.                           4.  Use CHG as you would any other  liquid soap.  You can apply chg directly  to the skin and wash  ?                     Gently with a scrungie or clean washcloth. ? 5.  Apply the CHG Soap to your body ONLY FROM THE NECK DOWN.   Do not use on face/ open      ?                     Wound or open sores. Avoid contact with eyes, ears mouth and genitals (private parts).  ?                     Production manager,  Genitals (private parts) with your normal soap. ?            6.  Wash thoroughly, paying special attention to the area where your surgery  will be performed. ? 7.  Thoroughly rinse your body with warm water from the neck down. ? 8.  DO NOT shower/wash with your normal soap after using and rinsing off  the CHG Soap. ?               9.  Pat yourself dry with a clean towel. ?           10.  Wear clean pajamas. ?  11.  Place clean sheets on your bed the night of your first shower and do not  sleep with pets. ?Day of Surgery : ?Do not apply any lotions/deodorants the morning of surgery.  Please wear clean clothes to the hospital/surgery center. ? ?FAILURE TO FOLLOW THESE INSTRUCTIONS MAY RESULT IN THE CANCELLATION OF YOUR SURGERY ?PATIENT SIGNATURE_________________________________ ? ?NURSE SIGNATURE__________________________________ ? ?________________________________________________________________________  ? ? ?           ?

## 2021-04-28 NOTE — Progress Notes (Addendum)
Anesthesia Review: ? ?PCP: Maury Dus  ?Cardiologist :Tressia Miners turner  [place] 10/19/2018  ?Allergy Asthma- Randall Hiss Koxzlow  LOV 12/30/20  ?Endocrino- Dr  Sherrian Divers  LOV 01/30/21  LOV 05/01/21  ?Chest x-ray : ?EKG : 04/30/21  ?Echo : ?Stress test: ?Cardiac Cath :  ?Activity level:  ?Sleep Study/ CPAP : ?Fasting Blood Sugar :      / Checks Blood Sugar -- times a day:   ?Blood Thinner/ Instructions /Last Dose: ?ASA / Instructions/ Last Dose :   ?81 mg Aspirin  ?DM- type 2- Freestyle Libre  ?Hgba1c- 04/30/21 - 8.6 routed to Dr Johney Maine  ?At preop appt pt was given standard instructions for humalog 75/25 for day before surgery. Pt also instructed to notify endocrinologist for instructions as related to pt.  PT states she sees endo- Dr Emmaline Life on 05/01/21.  PT to take preop instrucitons with her to appt and to notfiy preop nurse regarding instructions she receivd from DR Pao.  PT voiced understanding.  Please see note done 05/01/2021.  After pt had seen endocrinologist and recommendations made regarding diabetic meds prior to suge ry.   ?

## 2021-04-30 ENCOUNTER — Other Ambulatory Visit: Payer: Self-pay

## 2021-04-30 ENCOUNTER — Encounter (HOSPITAL_COMMUNITY)
Admission: RE | Admit: 2021-04-30 | Discharge: 2021-04-30 | Disposition: A | Discharge status: Home / Self Care | Payer: 59 | Source: Ambulatory Visit | Attending: Surgery | Admitting: Surgery

## 2021-04-30 ENCOUNTER — Encounter (HOSPITAL_COMMUNITY): Payer: Self-pay

## 2021-04-30 VITALS — BP 126/69 | HR 94 | Temp 98.4°F | Resp 16 | Ht 60.0 in | Wt 182.0 lb

## 2021-04-30 DIAGNOSIS — Z01818 Encounter for other preprocedural examination: Secondary | ICD-10-CM | POA: Diagnosis present

## 2021-04-30 HISTORY — DX: Unspecified osteoarthritis, unspecified site: M19.90

## 2021-04-30 HISTORY — DX: Anxiety disorder, unspecified: F41.9

## 2021-04-30 HISTORY — DX: Headache, unspecified: R51.9

## 2021-04-30 HISTORY — DX: Cardiac murmur, unspecified: R01.1

## 2021-04-30 LAB — BASIC METABOLIC PANEL
Anion gap: 6 (ref 5–15)
BUN: 16 mg/dL (ref 6–20)
CO2: 25 mmol/L (ref 22–32)
Calcium: 9.1 mg/dL (ref 8.9–10.3)
Chloride: 103 mmol/L (ref 98–111)
Creatinine, Ser: 0.99 mg/dL (ref 0.44–1.00)
GFR, Estimated: >60 mL/min (ref >60)
Glucose, Bld: 145 mg/dL — ABNORMAL HIGH (ref 70–99)
Potassium: 3.7 mmol/L (ref 3.5–5.1)
Sodium: 134 mmol/L — ABNORMAL LOW (ref 135–145)

## 2021-04-30 LAB — CBC
HCT: 42 % (ref 36.0–46.0)
Hemoglobin: 13.1 g/dL (ref 12.0–15.0)
MCH: 24 pg — ABNORMAL LOW (ref 26.0–34.0)
MCHC: 31.2 g/dL (ref 30.0–36.0)
MCV: 76.9 fL — ABNORMAL LOW (ref 80.0–100.0)
Platelets: 292 10*3/uL (ref 150–400)
RBC: 5.46 MIL/uL — ABNORMAL HIGH (ref 3.87–5.11)
RDW: 16.4 % — ABNORMAL HIGH (ref 11.5–15.5)
WBC: 7 10*3/uL (ref 4.0–10.5)
nRBC: 0 % (ref 0.0–0.2)

## 2021-04-30 LAB — HEMOGLOBIN A1C
Hgb A1c MFr Bld: 8.6 % — ABNORMAL HIGH (ref 4.8–5.6)
Mean Plasma Glucose: 200.12 mg/dL

## 2021-04-30 LAB — GLUCOSE, CAPILLARY: Glucose-Capillary: 163 mg/dL — ABNORMAL HIGH (ref 70–99)

## 2021-05-01 ENCOUNTER — Other Ambulatory Visit (HOSPITAL_COMMUNITY): Payer: Self-pay

## 2021-05-01 DIAGNOSIS — E113293 Type 2 diabetes mellitus with mild nonproliferative diabetic retinopathy without macular edema, bilateral: Secondary | ICD-10-CM | POA: Diagnosis not present

## 2021-05-01 DIAGNOSIS — E1165 Type 2 diabetes mellitus with hyperglycemia: Secondary | ICD-10-CM | POA: Diagnosis not present

## 2021-05-01 DIAGNOSIS — Z794 Long term (current) use of insulin: Secondary | ICD-10-CM | POA: Diagnosis not present

## 2021-05-01 DIAGNOSIS — E782 Mixed hyperlipidemia: Secondary | ICD-10-CM | POA: Diagnosis not present

## 2021-05-01 DIAGNOSIS — R809 Proteinuria, unspecified: Secondary | ICD-10-CM | POA: Diagnosis not present

## 2021-05-01 DIAGNOSIS — E669 Obesity, unspecified: Secondary | ICD-10-CM | POA: Diagnosis not present

## 2021-05-01 DIAGNOSIS — Z7984 Long term (current) use of oral hypoglycemic drugs: Secondary | ICD-10-CM | POA: Diagnosis not present

## 2021-05-01 DIAGNOSIS — Z7985 Long-term (current) use of injectable non-insulin antidiabetic drugs: Secondary | ICD-10-CM | POA: Diagnosis not present

## 2021-05-01 DIAGNOSIS — E1129 Type 2 diabetes mellitus with other diabetic kidney complication: Secondary | ICD-10-CM | POA: Diagnosis not present

## 2021-05-01 MED ORDER — MOUNJARO 5 MG/0.5ML ~~LOC~~ SOAJ
SUBCUTANEOUS | 3 refills | Status: DC
  Filled 2021-05-01: qty 2, 28d supply, fill #0
  Filled 2021-07-23: qty 2, 28d supply, fill #1

## 2021-05-01 MED ORDER — HUMALOG MIX 75/25 (75-25) 100 UNIT/ML ~~LOC~~ SUSP
SUBCUTANEOUS | 5 refills | Status: DC
  Filled 2021-05-01: qty 80, 57d supply, fill #0

## 2021-05-01 NOTE — Progress Notes (Addendum)
Pt called on 05/01/21 to inform preop nurse that she had seen Endocrinolgist today in regards to Diabetes meds prior to surgery.  PT reports to preop nurse that she is to take 30 units of Humalog 75/25 evening before surgery and 30 units am of surgery per endrinologist.   PT reports she is to take Synjardy day before surgery per endocrinologist.  No Synjardy day of surgery.  Per endrinologist.  Trulicity has been changed to mounjaro .66m/0.5 ml Start date of 05/03/21.  PT also reports that Humalogy 75/25 has been changed to 86 units at breakfast and 52 units before dinner per endocrinologist.  Meds and dosages changed in epic.  LClarene Reamernote for JShawn Stallto followup on Monday 05/04/2021  ?On 05/04/2021 reviewed what pt had informed the nurse the DR RJanese Bankshad instructed her to do and what was documented in Epic in the MD note from 05/01/21. NOte states for pt to take Synjardy day of surgery.  Notifed Diabetic coordinator, STama Headings, of above and all meds pt takes for diabetes with dosages and times of day along with recommendaitons made by Dr RJanese Banks  STama Headings diabetes Coordinator stated that pt should not take Synjardy day before nor am of surgery.  Informed Diabetes Coordinator that  PST nurse would inform [facility], Anesthesia of above and have her become involved in this .  Notified Jessica Ward, PAC of above and she stated she would call pt and DR RJanese Banksto clarify Diabetes preop instructions.   ?

## 2021-05-04 ENCOUNTER — Other Ambulatory Visit (HOSPITAL_COMMUNITY): Payer: Self-pay

## 2021-05-05 ENCOUNTER — Other Ambulatory Visit (HOSPITAL_COMMUNITY): Payer: Self-pay

## 2021-05-05 MED ORDER — HUMALOG MIX 75/25 KWIKPEN (75-25) 100 UNIT/ML ~~LOC~~ SUPN
PEN_INJECTOR | SUBCUTANEOUS | 2 refills | Status: DC
  Filled 2021-05-05: qty 39, 28d supply, fill #0

## 2021-05-06 ENCOUNTER — Other Ambulatory Visit (HOSPITAL_COMMUNITY): Payer: Self-pay

## 2021-05-07 ENCOUNTER — Ambulatory Visit (HOSPITAL_BASED_OUTPATIENT_CLINIC_OR_DEPARTMENT_OTHER): Payer: 59 | Admitting: Certified Registered Nurse Anesthetist

## 2021-05-07 ENCOUNTER — Ambulatory Visit (HOSPITAL_COMMUNITY): Payer: 59 | Admitting: Physician Assistant

## 2021-05-07 ENCOUNTER — Encounter (HOSPITAL_COMMUNITY)
Admission: RE | Disposition: A | Discharge status: Home / Self Care | Payer: Self-pay | Source: Ambulatory Visit | Attending: Surgery

## 2021-05-07 ENCOUNTER — Other Ambulatory Visit: Payer: Self-pay

## 2021-05-07 ENCOUNTER — Ambulatory Visit (HOSPITAL_COMMUNITY)
Admission: RE | Admit: 2021-05-07 | Discharge: 2021-05-07 | Disposition: A | Discharge status: Home / Self Care | Payer: 59 | Source: Ambulatory Visit | Attending: Surgery | Admitting: Surgery

## 2021-05-07 ENCOUNTER — Other Ambulatory Visit (HOSPITAL_COMMUNITY): Payer: Self-pay

## 2021-05-07 ENCOUNTER — Encounter (HOSPITAL_COMMUNITY): Payer: Self-pay | Admitting: Surgery

## 2021-05-07 DIAGNOSIS — X58XXXA Exposure to other specified factors, initial encounter: Secondary | ICD-10-CM | POA: Diagnosis not present

## 2021-05-07 DIAGNOSIS — I10 Essential (primary) hypertension: Secondary | ICD-10-CM | POA: Diagnosis not present

## 2021-05-07 DIAGNOSIS — L905 Scar conditions and fibrosis of skin: Secondary | ICD-10-CM | POA: Diagnosis not present

## 2021-05-07 DIAGNOSIS — Z87891 Personal history of nicotine dependence: Secondary | ICD-10-CM | POA: Diagnosis not present

## 2021-05-07 DIAGNOSIS — K219 Gastro-esophageal reflux disease without esophagitis: Secondary | ICD-10-CM | POA: Insufficient documentation

## 2021-05-07 DIAGNOSIS — K432 Incisional hernia without obstruction or gangrene: Secondary | ICD-10-CM | POA: Diagnosis not present

## 2021-05-07 DIAGNOSIS — Z794 Long term (current) use of insulin: Secondary | ICD-10-CM

## 2021-05-07 DIAGNOSIS — J45909 Unspecified asthma, uncomplicated: Secondary | ICD-10-CM | POA: Diagnosis not present

## 2021-05-07 DIAGNOSIS — J329 Chronic sinusitis, unspecified: Secondary | ICD-10-CM | POA: Diagnosis not present

## 2021-05-07 DIAGNOSIS — G4730 Sleep apnea, unspecified: Secondary | ICD-10-CM | POA: Diagnosis not present

## 2021-05-07 DIAGNOSIS — E119 Type 2 diabetes mellitus without complications: Secondary | ICD-10-CM

## 2021-05-07 DIAGNOSIS — K654 Sclerosing mesenteritis: Secondary | ICD-10-CM | POA: Diagnosis not present

## 2021-05-07 DIAGNOSIS — T8141XA Infection following a procedure, superficial incisional surgical site, initial encounter: Secondary | ICD-10-CM | POA: Diagnosis not present

## 2021-05-07 DIAGNOSIS — K430 Incisional hernia with obstruction, without gangrene: Secondary | ICD-10-CM | POA: Diagnosis not present

## 2021-05-07 DIAGNOSIS — Z6835 Body mass index (BMI) 35.0-35.9, adult: Secondary | ICD-10-CM | POA: Diagnosis not present

## 2021-05-07 HISTORY — PX: LAPAROTOMY: SHX154

## 2021-05-07 LAB — GLUCOSE, CAPILLARY
Glucose-Capillary: 87 mg/dL (ref 70–99)
Glucose-Capillary: 88 mg/dL (ref 70–99)

## 2021-05-07 SURGERY — LAPAROTOMY, EXPLORATORY
Anesthesia: General

## 2021-05-07 MED ORDER — DEXAMETHASONE SODIUM PHOSPHATE 10 MG/ML IJ SOLN
INTRAMUSCULAR | Status: AC
  Filled 2021-05-07: qty 1

## 2021-05-07 MED ORDER — PHENYLEPHRINE 80 MCG/ML (10ML) SYRINGE FOR IV PUSH (FOR BLOOD PRESSURE SUPPORT)
PREFILLED_SYRINGE | INTRAVENOUS | Status: AC
  Filled 2021-05-07: qty 10

## 2021-05-07 MED ORDER — ROCURONIUM BROMIDE 10 MG/ML (PF) SYRINGE
PREFILLED_SYRINGE | INTRAVENOUS | Status: DC | PRN
Start: 2021-05-07 — End: 2021-05-07
  Administered 2021-05-07: 70 mg via INTRAVENOUS

## 2021-05-07 MED ORDER — PROPOFOL 10 MG/ML IV BOLUS
INTRAVENOUS | Status: AC
  Filled 2021-05-07: qty 20

## 2021-05-07 MED ORDER — CHLORHEXIDINE GLUCONATE CLOTH 2 % EX PADS: 6.0000 | MEDICATED_PAD | Freq: Once | CUTANEOUS | Status: DC

## 2021-05-07 MED ORDER — FENTANYL CITRATE PF 50 MCG/ML IJ SOSY: 25.0000 ug | PREFILLED_SYRINGE | INTRAMUSCULAR | Status: DC | PRN

## 2021-05-07 MED ORDER — BUPIVACAINE-EPINEPHRINE (PF) 0.25% -1:200000 IJ SOLN
INTRAMUSCULAR | Status: AC
  Filled 2021-05-07: qty 30

## 2021-05-07 MED ORDER — PROPOFOL 10 MG/ML IV BOLUS
INTRAVENOUS | Status: DC | PRN
  Administered 2021-05-07: 50 mg via INTRAVENOUS
  Administered 2021-05-07: 200 mg via INTRAVENOUS

## 2021-05-07 MED ORDER — LACTATED RINGERS IV SOLN
INTRAVENOUS | Status: DC
Start: 2021-05-07 — End: 2021-05-07

## 2021-05-07 MED ORDER — CELECOXIB 200 MG PO CAPS
400.0000 mg | ORAL_CAPSULE | ORAL | Status: AC
  Administered 2021-05-07: 400 mg via ORAL
  Filled 2021-05-07: qty 2

## 2021-05-07 MED ORDER — ONDANSETRON HCL 4 MG/2ML IJ SOLN
INTRAMUSCULAR | Status: DC | PRN
  Administered 2021-05-07: 4 mg via INTRAVENOUS

## 2021-05-07 MED ORDER — FENTANYL CITRATE (PF) 100 MCG/2ML IJ SOLN
INTRAMUSCULAR | Status: AC
  Filled 2021-05-07: qty 2

## 2021-05-07 MED ORDER — ACETAMINOPHEN 500 MG PO TABS
1000.0000 mg | ORAL_TABLET | ORAL | Status: AC
  Administered 2021-05-07: 1000 mg via ORAL
  Filled 2021-05-07: qty 2

## 2021-05-07 MED ORDER — LIDOCAINE 2% (20 MG/ML) 5 ML SYRINGE
INTRAMUSCULAR | Status: DC | PRN
  Administered 2021-05-07: 60 mg via INTRAVENOUS

## 2021-05-07 MED ORDER — ARTIFICIAL TEARS OPHTHALMIC OINT
TOPICAL_OINTMENT | OPHTHALMIC | Status: AC
  Filled 2021-05-07: qty 3.5

## 2021-05-07 MED ORDER — PHENYLEPHRINE 80 MCG/ML (10ML) SYRINGE FOR IV PUSH (FOR BLOOD PRESSURE SUPPORT)
PREFILLED_SYRINGE | INTRAVENOUS | Status: DC | PRN
  Administered 2021-05-07: 80 ug via INTRAVENOUS
  Administered 2021-05-07: 160 ug via INTRAVENOUS
  Administered 2021-05-07 (×2): 80 ug via INTRAVENOUS
  Administered 2021-05-07: 160 ug via INTRAVENOUS
  Administered 2021-05-07: 80 ug via INTRAVENOUS
  Administered 2021-05-07: 120 ug via INTRAVENOUS
  Administered 2021-05-07: 80 ug via INTRAVENOUS
  Administered 2021-05-07: 120 ug via INTRAVENOUS

## 2021-05-07 MED ORDER — 0.9 % SODIUM CHLORIDE (POUR BTL) OPTIME
TOPICAL | Status: DC | PRN
  Administered 2021-05-07: 1000 mL

## 2021-05-07 MED ORDER — CEFAZOLIN IN SODIUM CHLORIDE 3-0.9 GM/100ML-% IV SOLN
3.0000 g | INTRAVENOUS | Status: AC
  Administered 2021-05-07: 3 g via INTRAVENOUS
  Filled 2021-05-07: qty 100

## 2021-05-07 MED ORDER — ESMOLOL HCL 100 MG/10ML IV SOLN
INTRAVENOUS | Status: DC | PRN
  Administered 2021-05-07 (×2): 30 mg via INTRAVENOUS

## 2021-05-07 MED ORDER — SUGAMMADEX SODIUM 200 MG/2ML IV SOLN
INTRAVENOUS | Status: DC | PRN
Start: 2021-05-07 — End: 2021-05-07
  Administered 2021-05-07: 330.4 mg via INTRAVENOUS

## 2021-05-07 MED ORDER — ORAL CARE MOUTH RINSE: 15.0000 mL | Freq: Once | OROMUCOSAL | Status: AC

## 2021-05-07 MED ORDER — CHLORHEXIDINE GLUCONATE 0.12 % MT SOLN
15.0000 mL | Freq: Once | OROMUCOSAL | Status: AC
  Administered 2021-05-07: 15 mL via OROMUCOSAL

## 2021-05-07 MED ORDER — FENTANYL CITRATE (PF) 100 MCG/2ML IJ SOLN
INTRAMUSCULAR | Status: DC | PRN
  Administered 2021-05-07 (×4): 50 ug via INTRAVENOUS

## 2021-05-07 MED ORDER — DEXAMETHASONE SODIUM PHOSPHATE 4 MG/ML IJ SOLN
INTRAMUSCULAR | Status: DC | PRN
  Administered 2021-05-07: 4 mg via INTRAVENOUS

## 2021-05-07 MED ORDER — ESMOLOL HCL 100 MG/10ML IV SOLN
INTRAVENOUS | Status: AC
  Filled 2021-05-07: qty 10

## 2021-05-07 MED ORDER — TRAMADOL HCL 50 MG PO TABS
50.0000 mg | ORAL_TABLET | Freq: Four times a day (QID) | ORAL | 0 refills | Status: DC | PRN
  Filled 2021-05-07: qty 20, 3d supply, fill #0

## 2021-05-07 MED ORDER — GABAPENTIN 300 MG PO CAPS
300.0000 mg | ORAL_CAPSULE | ORAL | Status: AC
  Administered 2021-05-07: 300 mg via ORAL
  Filled 2021-05-07: qty 1

## 2021-05-07 MED ORDER — CHLORHEXIDINE GLUCONATE CLOTH 2 % EX PADS
6.0000 | MEDICATED_PAD | Freq: Once | CUTANEOUS | Status: DC
Start: 2021-05-07 — End: 2021-05-07

## 2021-05-07 MED ORDER — ONDANSETRON HCL 4 MG/2ML IJ SOLN
INTRAMUSCULAR | Status: AC
  Filled 2021-05-07: qty 2

## 2021-05-07 MED ORDER — BUPIVACAINE-EPINEPHRINE (PF) 0.25% -1:200000 IJ SOLN
INTRAMUSCULAR | Status: DC | PRN
  Administered 2021-05-07: 30 mL

## 2021-05-07 MED ORDER — ROCURONIUM BROMIDE 10 MG/ML (PF) SYRINGE
PREFILLED_SYRINGE | INTRAVENOUS | Status: AC
  Filled 2021-05-07: qty 10

## 2021-05-07 SURGICAL SUPPLY — 26 items
APL PRP STRL LF DISP 70% ISPRP (MISCELLANEOUS) ×1
BINDER ABDOMINAL 12 ML 46-62 (SOFTGOODS) ×1 IMPLANT
CHLORAPREP W/TINT 26 (MISCELLANEOUS) ×3 IMPLANT
COVER MAYO STAND STRL (DRAPES) ×3 IMPLANT
COVER SURGICAL LIGHT HANDLE (MISCELLANEOUS) ×3 IMPLANT
DRAPE LAPAROSCOPIC ABDOMINAL (DRAPES) ×3 IMPLANT
DRAPE UTILITY XL STRL (DRAPES) ×6 IMPLANT
DRSG TEGADERM 4X4.75 (GAUZE/BANDAGES/DRESSINGS) ×1 IMPLANT
ELECT REM PT RETURN 15FT ADLT (MISCELLANEOUS) ×3 IMPLANT
GAUZE SPONGE 4X4 12PLY STRL (GAUZE/BANDAGES/DRESSINGS) ×1 IMPLANT
GLOVE ECLIPSE 8.0 STRL XLNG CF (GLOVE) ×5 IMPLANT
GLOVE INDICATOR 8.0 STRL GRN (GLOVE) ×5 IMPLANT
GOWN STRL REUS W/ TWL XL LVL3 (GOWN DISPOSABLE) ×6 IMPLANT
GOWN STRL REUS W/TWL XL LVL3 (GOWN DISPOSABLE) ×4
KIT BASIN OR (CUSTOM PROCEDURE TRAY) ×3 IMPLANT
KIT TURNOVER KIT A (KITS) ×1 IMPLANT
NDL DENTAL RB 25GX1.25 (NEEDLE) IMPLANT
NEEDLE DENTAL RB 25GX1.25 (NEEDLE) ×2 IMPLANT
PACK GENERAL/GYN (CUSTOM PROCEDURE TRAY) ×3 IMPLANT
SUT MNCRL AB 4-0 PS2 18 (SUTURE) ×1 IMPLANT
SUT PDS AB 1 CT1 27 (SUTURE) ×4 IMPLANT
SUT VIC AB 2-0 SH 18 (SUTURE) ×2 IMPLANT
SYR CONTROL 10ML LL (SYRINGE) ×1 IMPLANT
TAPE UMBILICAL 1/8X30 (MISCELLANEOUS) ×1 IMPLANT
TOWEL OR 17X26 10 PK STRL BLUE (TOWEL DISPOSABLE) ×6 IMPLANT
TOWEL OR NON WOVEN STRL DISP B (DISPOSABLE) ×6 IMPLANT

## 2021-05-07 NOTE — Anesthesia Procedure Notes (Signed)
Procedure Name: Intubation ?Date/Time: 05/07/2021 2:50 PM ?Performed by: Montel Clock, CRNA ?Pre-anesthesia Checklist: Patient identified, Emergency Drugs available, Suction available, Patient being monitored and Timeout performed ?Patient Re-evaluated:Patient Re-evaluated prior to induction ?Oxygen Delivery Method: Circle system utilized ?Preoxygenation: Pre-oxygenation with 100% oxygen ?Induction Type: IV induction ?Ventilation: Mask ventilation without difficulty ?Laryngoscope Size: Mac and 3 ?Grade View: Grade II ?Tube type: Oral ?Tube size: 7.0 mm ?Number of attempts: 1 ?Airway Equipment and Method: Stylet ?Placement Confirmation: ETT inserted through vocal cords under direct vision, positive ETCO2 and breath sounds checked- equal and bilateral ?Secured at: 21 cm ?Tube secured with: Tape ?Dental Injury: Injury to lip  ?Comments: Grade 2b view with downward laryngeal pressure. Large tongue. Upper lip laceration, petroleum jelly applied. ? ? ? ? ?

## 2021-05-07 NOTE — Op Note (Signed)
05/07/2021 ? ?4:11 PM ? ?PATIENT:  Mariah [name]  61 y.o. female ? ?Patient Care Team: ?Maury Dus, MD as PCP - General (Family Medicine) ?Michael Boston, MD as Consulting Physician (General Surgery) ?Jacelyn Pi, MD as Consulting Physician (Endocrinology) ?Wilford Corner, MD as Consulting Physician (Gastroenterology) ? ?PRE-OPERATIVE DIAGNOSIS:  RECURRENT DRAINING SINUS. PROBABLE RETAINED STITCH VERSUS CHRONIC ABSCESS ? 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 ?PROCEDURE:  ?ABDOMINAL WALL EXPLORATION ?REMOVAL OF FOREIGN BODY (SUTURE) ?PRIMARY VENTRAL WALL HERNIA REPAIR (PARTIAL) ? ?SURGEON:  Adin Hector, MD ? ?ASSISTANT: OR Staff  ? ?ANESTHESIA:    ?General and Local anesthesia as a field block (0.25% bupivacaine with epinephrine  ? ?EBL:  Total I/O ?In: 1000 [I.V.:1000] ?Out: 10 [Blood:10].  See anesthesia record ? ?Delay start of Pharmacological VTE agent (>24hrs) due to surgical blood loss or risk of bleeding:  no ? ?DRAINS:  None  ? 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 ?DISPOSITION OF SPECIMEN:  PATHOLOGY ? ?COUNTS:  YES ? ?PLAN OF CARE: Discharge to home after PACU ? ?PATIENT DISPOSITION:  PACU - hemodynamically stable. ? ?INDICATION: Morbidly obese patient requiring emergency surgery and colostomy and colostomy takedown who has developed chronically incarcerated incisional hernia.  Nonobstructing.  Felt to be high risk of repair.  However has had periumbilical wound intermittently opening up despite aggressive local wound care.  Because of persistent drainage I offered abdominal exploration under anesthesia to determine source.  Stitch abscess suspected.  Perhaps partial closure of some hernias but hold off on total hernia repair.  ? ?The pathophysiology of skin & subcutaneous masses was discussed.  Natural history risks without surgery were  discussed.  I recommended surgery to remove the mass.  I explained the technique of removal with use of aggressive sedation/anesthesia for patient comfort.   ? ?Risks such as bleeding, infection, wound breakdown, heart attack, death, and other risks were discussed.  I noted a good likelihood this will help address the problem.   Possibility that this will not correct all symptoms was explained. Possibility of regrowth/recurrence of the mass was discussed.  We will work to minimize complications. Questions were answered.  The patient expresses understanding & wishes to proceed with surgery. ? ?OR FINDINGS: Periumbilical sinus wound tracking 5 cm deep to Prlene suture knot with long running tail.  Suture excised and fistula tract excised.  Giant incisional hernia incarcerated primarily with omentum going to right panniculus left in situ.  Swiss cheese hernias periumbilically near the site of the chronic stitch abscess.  Umbilicus with skin denudation with ellipsoid smooth mass most likely consistent with fat necrosis from chronic periumbilical incarceration.  Umbilicectomy and excision of fat necrosis done ? ?Resulting 7 x 2 cm Swiss cheese hernias primarily repaired with interrupted absorbable #1 PDS suture.  Subcutaneous flaps created and wound closed with a layers of absorbable Vicryl & Monocryl suture with umbilical tape wicks. ? ?DESCRIPTION:  ?  ?Informed consent was confirmed.  Patient underwent general anesthesia without incident patient abdomen was prepped and draped in sterile fashion.  Surgical timeout confirmed our plan. ?  ?I made an incision with scalpel around the sinus tract.  Initially 5 x 1 cm elliptical incision.  Came through the dermis with cautery and then scissors to follow the fistulous tract down to the base fascia where a not a Prolene suture was present.  This was elevated and trimmed.  Long Prolene tail removed with it and block.  Consistent with running permanent blue Prolene suture that  had broken.  Patient's umbilicus was quite thinned out with hard rocky mobile mass within it.  Ended up excising the umbilicus to get to healthier tissue and found a rocky hard smooth mass most likely consistent with fat necrosis from chronic incarceration.  Hernia noted at the umbilicus came down infraumbilically to remove the thickened area of the scar.  Upper and lower parts of the long midline incisional scar left alone since this was smooth without irritation.  That sharp dissection to come through some scar tissue. ? ?I freed skin and subcutaneous tissue flaps off of the fascia left lateral which was intac.  I was less aggressive on the right side large volume omentum felt into a very large infraumbilical hernia defect going to the right part of the panniculus.  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 ? ?I primarily closed the periumbilical Swiss cheese hernias with absorbable #1 PDS interrupting fashion.  Did buried stitches to have the knots not be exposed.  I then closed the wound using interrupted 2-0 Vicryl suture at Scarpa's and interrupted deep dermal stitches.  I then closed the skin with interrupted running 4 Monocryl suture.  I left 5 mm gaps in the upper third and lower third of the closure and placed umbilical tape wicks gently soaked in chlorhexidine to go to the base of the wound to allow it to drain.  Sterile dressing applied  ? ?I discussed operative findings, updated the patient's status, discussed probable steps to recovery, and gave postoperative recommendations to the patient's spouse, Rutha Bouchard.  Recommendations were made.  Questions were answered.  He expressed understanding & appreciation.  ?  ?Adin Hector, MD, FACS, MASCRS ?Gastrointestinal and Minimally Invasive Surgery ?  ? ?  ?[address] ?[place], [place]  [address] ?[phone] Main / Paging ?[phone] Fax ?  ?  ?  ?  ? ? ? ? ? ? ? ? ?  ? ?

## 2021-05-07 NOTE — H&P (Signed)
05/07/2021 ? ?  ? ? ?REFERRING PHYSICIAN: Self ? ?Patient Care Team: ?Salvadore Dom., MD as PCP - General (Family Medicine) ? ?PROVIDER: Hollace Kinnier, MD ? ?[facility] MRN: [id number] ?DOB: [birth year] ? ? ?Subjective  ? ?Chief Complaint: No chief complaint on file. ? ? ?History of Present Illness: ?Mariah [name] is a 61 y.o. female who is seen today as an office consultation  ? ?NO new events - ready for surgery ? ?Patient returns yet again. CT scan revealed chronically incarcerated hernias containing bowel but without any stricturing or inflammation. It seems like the area dried up. However she noticed it started opening up and swelling again in February. She has been covering with a Band-Aid. Usually has to change it once or twice a day. Sensitive and frustrating. She did do a trip to [place] and tolerated that well without any nausea vomiting or fevers or chills. Move her bowels every day. Wishes to try something more aggressive to get the area to heal up. ? ?PRIOR NOTE 2023: ?Patient returns. She is coming back for nurse only visit still packing. Is gotten the point where he cannot be packed anymore. She is putting just dry gauze and a Band-Aid on it. Minimal drainage. She feels better. ? ?She does concern that she still has swelling in her lower abdomen and think she has a recurrent hernia. Has had some discomfort at times. Challenging to exercise. Wanting to know if something can be done about that. ? ?PRIOR NOTE 11/17/2020: Patient notes after I cauterized her chronic wound did seem to dry up. However she felt it opened up and drained again. She called to be seen again. Denies any fevers or chills. ? ?PRIOR NOTE 09/29/2020 ?Woman who required emergency [facility] sigmoid colectomy and colostomy by Dr. Georganna Skeans with our group 02/06/2010 for perforated diverticulitis. 2-week hospital course rather prolonged. Eventually recovered. Had a chronic open wound that eventually close down.  Underwent laparoscopically assisted colostomy takedown on 08/01/2010 by me. She did have a wound at her old colostomy site with seroma. This was packed and eventually sealed up by 6 weeks postop. She felt some pain and swelling a month postop and was seen by my partner, Dr. Zella Richer, who noted all wounds and incisions were closed with no hernia. We have not seen her in 10 years. ? ?Patient notes she felt a popping sensation and some drainage a couple weeks ago near her bellybutton. She is try to keep it covered and protected but the scab keeps intermittently draining. Nothing major. Eating well. No nausea or vomiting. She does have swelling in her right lower side. She suspects it is from her chronic incisional hernia. That has not gotten particularly larger. No major pain or discomfort. Moving her bowels every day. No fevers or chills. No nausea or vomiting. ? ?Medical History: ?Past Medical History:  ?Diagnosis Date  ? Anemia  ? Anxiety  ? Asthma, unspecified asthma severity, unspecified whether complicated, unspecified whether persistent  ? Diabetes mellitus without complication (CMS-HCC)  ? GERD (gastroesophageal reflux disease)  ? Glaucoma (increased eye pressure)  ? Hyperlipidemia  ? Hypertension  ? Sleep apnea  ? ?Patient Active Problem List  ?Diagnosis  ? History of diverticulitis of colon  ? Severe obesity with body mass index (BMI) of 36.0 to 36.9 with serious comorbidity (CMS-HCC)  ? Recurrent incisional hernia with incarceration  ? Wound discharge  ? ?Past Surgical History:  ?Procedure Laterality Date  ? COLON SURGERY  ? ? ?  Allergies  ?Allergen Reactions  ? Codeine Hives, Itching and Swelling  ? Other Other (See Comments)  ?Flonase make patient's nose bleed. ?Flonase make patient's nose bleed. ? ? ?Current Outpatient Medications on File Prior to Visit  ?Medication Sig Dispense Refill  ? CONCENTRATED insulin REGULAR (HUMULIN R U-500, CONC, KWIKPEN) 500 unit/mL (3 mL) pen injector Humulin R U-500 (Conc)  Insulin Kwikpen 500 unit/mL (3 mL) subcutaneous  ? dulaglutide (TRULICITY) 1.5 YF/7.4 mL subcutaneous pen injector Trulicity 1.5 BS/4.9 mL subcutaneous pen injector ?INJECT 1.5 MG INTO THE SKIN EVERY 7 DAYS.  ? DULoxetine (CYMBALTA) 60 MG DR capsule duloxetine 60 mg capsule,delayed release ?1 capsule by mouth Every evening on a full stomach with high protein content 90 days  ? empagliflozin-metformin (SYNJARDY) 12.5-1,000 mg tablet Take 1 tablet by mouth once daily  ? lisinopriL (ZESTRIL) 10 MG tablet lisinopril 10 mg tablet ?1 tablet by mouth every morning  ? methylphenidate HCl (CONCERTA) 27 MG ER tablet Concerta 27 mg tablet,extended release ?1 tablet by mouth every morning  ? pantoprazole (PROTONIX) 40 MG DR tablet pantoprazole 40 mg tablet,delayed release  ? pravastatin (PRAVACHOL) 40 MG tablet pravastatin 40 mg tablet ?1 tablet by mouth at bedtime 90 days  ? ?No current facility-administered medications on file prior to visit.  ? ?Family History  ?Problem Relation Age of Onset  ? Diabetes Mother  ? Colon cancer Maternal Grandfather  ? Breast cancer Paternal Grandmother  ? ? ?Social History  ? ?Tobacco Use  ?Smoking Status Former  ? Types: Cigarettes  ? Quit date: 63  ? Years since quitting: 40.8  ?Smokeless Tobacco Never  ? ? ?Social History  ? ?Socioeconomic History  ? Marital status: Married  ?Tobacco Use  ? Smoking status: Former  ?Types: Cigarettes  ?Quit date: 58  ?Years since quitting: 40.8  ? Smokeless tobacco: Never  ?Substance and Sexual Activity  ? Alcohol use: Yes  ? Drug use: Not Currently  ?Social History Narrative  ?Works in coding at [employer]  ? ?############################################################ ? ?Review of Systems: ?A complete review of systems (ROS) was obtained from the patient. I have reviewed this information and discussed as appropriate with the patient. See HPI as well for other pertinent ROS. ? ?Constitutional: No fevers, chills, sweats. Weight stable ?Eyes: No vision  changes, No discharge ?HENT: No sore throats, nasal drainage ?Lymph: No neck swelling, No bruising easily ?Pulmonary: No cough, productive sputum ?CV: No orthopnea, PND Patient walks 15without difficulty. No exertional chest/neck/shoulder/arm pain. ? ?GI: No personal nor family history of GI/colon cancer, inflammatory bowel disease, irritable bowel syndrome, allergy such as Celiac Sprue, dietary/dairy problems, colitis, ulcers nor gastritis. No recent sick contacts/gastroenteritis. No travel outside the country. No changes in diet. ? ?Renal: No UTIs, No hematuria ?Genital: No drainage, bleeding, masses ?Musculoskeletal: No severe joint pain. Good ROM major joints ?Skin: No sores or lesions ?Heme/Lymph: No easy bleeding. No swollen lymph nodes ? ?Objective:  ? ?Vitals:  ?11/17/20 1614  ?Pulse: 82  ?Temp: 36.4 ?C (97.6 ?F)  ?SpO2: 92%  ?Weight: 86.5 kg (190 lb 9.6 oz)  ?Height: 152.4 cm (5')  ? ? ?Body mass index is 37.22 kg/m?. ? ?PHYSICAL EXAM: ? ?Constitutional: Not cachectic. Hygeine adequate. Vitals signs as above. Right and alert. Moving around easily without any help or assistance. ?Eyes: Pupils reactive, normal extraocular movements. Sclera nonicteric ?Neuro: CN II-XII intact. No major focal sensory defects. No major motor deficits. ?Lymph: No head/neck/groin lymphadenopathy ?Psych: No severe agitation. No severe anxiety. Judgment &  insight Adequate, Oriented x4, ?HENT: Normocephalic, Mucus membranes moist. No thrush.  ?Neck: Supple, No tracheal deviation. No obvious thyromegaly ?Chest: No pain to chest wall compression. Good respiratory excursion. No audible wheezing ?CV: Pulses intact. Regular rhythm. No major extremity edema ? ?Abdomen:  ? ?Obese with panniculus Hernia: Right lower quadrant swelling consistent with large chronic hernia. Supraumbilical smaller incisional hernia as well.  ? ?Incision well-healed except around the umbilicus there is a draining punctate sinus with 3 x 1 cm area of thickening  and induration. I feel perhaps a retained stitch in the base of her umbilicus which is thinned out and sensitive.  ? ?Gen: Inguinal hernia: Not present. Inguinal lymph nodes: without lymphadenopathy.  ?Rectal: (

## 2021-05-07 NOTE — Discharge Instructions (Signed)
HERNIA REPAIR: POST OP INSTRUCTIONS ? ?###################################################################### ? ?EAT ?Gradually transition to a high fiber diet with a fiber supplement over the next few weeks after discharge.  Start with a pureed / full liquid diet (see below) ? ?WALK ?Walk an hour a day.  Control your pain to do that.   ? ?CONTROL PAIN ?Control pain so that you can walk, sleep, tolerate sneezing/coughing, and go up/down stairs. ? ?HAVE A BOWEL MOVEMENT DAILY ?Keep your bowels regular to avoid problems.  OK to try a laxative to override constipation.  OK to use an antidairrheal to slow down diarrhea.  Call if not better after 2 tries ? ?CALL IF YOU HAVE PROBLEMS/CONCERNS ?Call if you are still struggling despite following these instructions. ?Call if you have concerns not answered by these instructions ? ?###################################################################### ? ? ? ?DIET: Follow a light bland diet & liquids the first 24 hours after arrival home, such as soup, liquids, starches, etc.  Be sure to drink plenty of fluids.  Quickly advance to a usual solid diet within a few days.  Avoid fast food or heavy meals as your are more likely to get nauseated or have irregular bowels.  A low-fat, high-fiber diet for the rest of your life is ideal.  ? ?Take your usually prescribed home medications unless otherwise directed. ? ?PAIN CONTROL: ?Pain is best controlled by a usual combination of three different methods TOGETHER: ?Ice/Heat ?Over the counter pain medication ?Prescription pain medication ?Most patients will experience some swelling and bruising around the hernia(s) such as the bellybutton, groins, or old incisions.  Ice packs or heating pads (30-60 minutes up to 6 times a day) will help. Use ice for the first few days to help decrease swelling and bruising, then switch to heat to help relax tight/sore spots and speed recovery.  Some people prefer to use ice alone, heat alone, alternating  between ice & heat.  Experiment to what works for you.  Swelling and bruising can take several weeks to resolve.   ?It is helpful to take an over-the-counter pain medication regularly for the first few weeks.  Choose one of the following that works best for you: ?Naproxen (Aleve, etc)  Two 274m tabs twice a day ?Ibuprofen (Advil, etc) Three 2015mtabs four times a day (every meal & bedtime) ?Acetaminophen (Tylenol, etc) 325-65029mour times a day (every meal & bedtime) ?A  prescription for pain medication should be given to you upon discharge.  Take your pain medication as prescribed.  ?If you are having problems/concerns with the prescription medicine (does not control pain, nausea, vomiting, rash, itching, etc), please call us [phone] see if we need to switch you to a different pain medicine that will work better for you and/or control your side effect better. ?If you need a refill on your pain medication, please contact your pharmacy.  They will contact our office to request authorization. Prescriptions will not be filled after 5 pm or on week-ends. ? ?Avoid getting constipated.  Between the surgery and the pain medications, it is common to experience some constipation.  Increasing fluid intake and taking a fiber supplement (such as Metamucil, Citrucel, FiberCon, MiraLax, etc) 1-2 times a day regularly will usually help prevent this problem from occurring.  A mild laxative (prune juice, Milk of Magnesia, MiraLax, etc) should be taken according to package directions if there are no bowel movements after 48 hours.   ? ?Wash / shower every day.  You may shower over the dressings  as they are waterproof.   ? ?It is good for closed incisions and even open wounds to be washed every day.  Shower every day.  Short baths are fine.  Wash the incisions and wounds clean with soap & water.    ?You may leave closed incisions open to air if it is dry.   You may cover the incision with clean gauze & replace it after  your daily shower for comfort. ? ?TEGADERM:  You have clear gauze band-aid dressings over your closed incision(s).  Return to the [facility] office for nurse only visit next week to remove the dressing and umbilical tape shoelace wicks in the incision and place a new dressing ? ? ? ?ACTIVITIES as tolerated:   ?You may resume regular (light) daily activities beginning the next day--such as daily self-care, walking, climbing stairs--gradually increasing activities as tolerated.  Control your pain so that you can walk an hour a day.  If you can walk 30 minutes without difficulty, it is safe to try more intense activity such as jogging, treadmill, bicycling, low-impact aerobics, swimming, etc. ?Save the most intensive and strenuous activity for last such as sit-ups, heavy lifting, contact sports, etc  Refrain from any heavy lifting or straining until you are off narcotics for pain control.   ?DO NOT PUSH THROUGH PAIN.  Let pain be your guide: If it hurts to do something, don't do it.  Pain is your body warning you to avoid that activity for another week until the pain goes down. ?You may drive when you are no longer taking prescription pain medication, you can comfortably wear a seatbelt, and you can safely maneuver your car and apply brakes. ?You may have sexual intercourse when it is comfortable.  ? ?FOLLOW UP in our office ?Please call [facility] at [phone] to set up an appointment to see your surgeon in the office for a follow-up appointment approximately 2-3 weeks after your surgery. ?Make sure that you call for this appointment the day you arrive home to insure a convenient appointment time. ? ?9.  If you have disability of FMLA / Family leave forms, please bring the forms to the office for processing.  (do not give to your surgeon). ? ?WHEN TO CALL us [phone]: ?Poor pain control ?Reactions / problems with new medications (rash/itching, nausea, etc)  ?Fever over 101.5 F (38.5 C) ?Inability to urinate ?Nausea  and/or vomiting ?Worsening swelling or bruising ?Continued bleeding from incision. ?Increased pain, redness, or drainage from the incision ? ? The clinic staff is available to answer your questions during regular business hours (8:30am-5pm).  Please don?t hesitate to call and ask to speak to one of our nurses for clinical concerns.  ? If you have a medical emergency, go to the nearest emergency room or call 911. ? A surgeon from [facility] is always on call at the hospitals in [place] ? ?[facility], [place] ?[address], [place], [place], [place]  [address] ? ? [address], [place], [place]   [address] ?MAIN: [phone] ? TOLL FREE: [phone] ? FAX: [phone] ?[email or web address] ? ?

## 2021-05-07 NOTE — Anesthesia Preprocedure Evaluation (Signed)
Anesthesia Evaluation  ?Patient identified by MRN, date of birth, ID band ?Patient awake ? ? ? ?Reviewed: ?Allergy & Precautions, NPO status , Patient's Chart, lab work & pertinent test results ? ?Airway ?Mallampati: II ? ?TM Distance: >3 FB ? ? ? ? Dental ?  ?Pulmonary ?asthma , sleep apnea , former smoker,  ?  ?breath sounds clear to auscultation ? ? ? ? ? ? Cardiovascular ?hypertension, + Valvular Problems/Murmurs  ?Rhythm:Regular Rate:Normal ? ? ?  ?Neuro/Psych ? Headaches, PSYCHIATRIC DISORDERS   ? GI/Hepatic ?Neg liver ROS, GERD  ,  ?Endo/Other  ?diabetesHypothyroidism  ? Renal/GU ?negative Renal ROS  ? ?  ?Musculoskeletal ? ?(+) Arthritis ,  ? Abdominal ?  ?Peds ? Hematology ?  ?Anesthesia Other Findings ? ? Reproductive/Obstetrics ? ?  ? ? ? ? ? ? ? ? ? ? ? ? ? ?  ?  ? ? ? ? ? ? ? ? ?Anesthesia Physical ?Anesthesia Plan ? ?ASA: 3 ? ?Anesthesia Plan: General  ? ?Post-op Pain Management:   ? ?Induction:  ? ?PONV Risk Score and Plan: 3 and Dexamethasone ? ?Airway Management Planned:  ? ?Additional Equipment:  ? ?Intra-op Plan:  ? ?Post-operative Plan: Possible Post-op intubation/ventilation ? ?Informed Consent: I have reviewed the patients History and Physical, chart, labs and discussed the procedure including the risks, benefits and alternatives for the proposed anesthesia with the patient or authorized representative who has indicated his/her understanding and acceptance.  ? ? ? ?Dental advisory given ? ?Plan Discussed with: CRNA and Anesthesiologist ? ?Anesthesia Plan Comments:   ? ? ? ? ? ? ?Anesthesia Quick Evaluation ? ?

## 2021-05-07 NOTE — Transfer of Care (Signed)
Immediate Anesthesia Transfer of Care Note ? ?Patient: Mariah [name] ? ?Procedure(s) Performed: ABDOMINAL WALL EXPLORATION, PROBABLE REMOVAL OF FOREIGN BODY ? ?Patient Location: PACU ? ?Anesthesia Type:General ? ?Level of Consciousness: drowsy and patient cooperative ? ?Airway & Oxygen Therapy: Patient Spontanous Breathing and Patient connected to face mask oxygen ? ?Post-op Assessment: Report given to RN and Post -op Vital signs reviewed and stable ? ?Post vital signs: Reviewed and stable ? ?Last Vitals:  ?Vitals Value Taken Time  ?BP 133/91 05/07/21 1615  ?Temp    ?Pulse 77 05/07/21 1617  ?Resp 12 05/07/21 1617  ?SpO2 94 % 05/07/21 1617  ?Vitals shown include unvalidated device data. ? ?Last Pain:  ?Vitals:  ? 05/07/21 1334  ?TempSrc:   ?PainSc: 0-No pain  ?   ? ?  ? ?Complications: No notable events documented. ?

## 2021-05-07 NOTE — Anesthesia Postprocedure Evaluation (Signed)
Anesthesia Post Note ? ?Patient: Mariah [name] ? ?Procedure(s) Performed: ABDOMINAL WALL EXPLORATION, PROBABLE REMOVAL OF FOREIGN BODY ? ?  ? ?Patient location during evaluation: PACU ?Anesthesia Type: General ?Level of consciousness: awake and alert, oriented and patient cooperative ?Pain management: pain level controlled ?Vital Signs Assessment: post-procedure vital signs reviewed and stable ?Respiratory status: spontaneous breathing, nonlabored ventilation and respiratory function stable ?Cardiovascular status: blood pressure returned to baseline and stable ?Postop Assessment: no apparent nausea or vomiting ?Anesthetic complications: no ? ? ?No notable events documented. ? ?Last Vitals:  ?Vitals:  ? 05/07/21 1316 05/07/21 1615  ?BP: (!) 152/98 (!) 133/91  ?Pulse: 99 78  ?Resp: 17 12  ?Temp: 36.7 ?C 36.8 ?C  ?SpO2: 98% 92%  ?  ?Last Pain:  ?Vitals:  ? 05/07/21 1615  ?TempSrc:   ?PainSc: 0-No pain  ? ? ?  ?  ?  ?  ?  ?  ? ?Jarome Matin [name] ? ? ? ? ?

## 2021-05-08 ENCOUNTER — Encounter (HOSPITAL_COMMUNITY): Payer: Self-pay | Admitting: Surgery

## 2021-05-08 ENCOUNTER — Emergency Department (HOSPITAL_COMMUNITY)
Admission: EM | Admit: 2021-05-08 | Discharge: 2021-05-09 | Disposition: A | Discharge status: Home / Self Care | Payer: 59 | Attending: Emergency Medicine | Admitting: Emergency Medicine

## 2021-05-08 ENCOUNTER — Other Ambulatory Visit: Payer: Self-pay

## 2021-05-08 DIAGNOSIS — T819XXA Unspecified complication of procedure, initial encounter: Secondary | ICD-10-CM | POA: Insufficient documentation

## 2021-05-08 DIAGNOSIS — Z7982 Long term (current) use of aspirin: Secondary | ICD-10-CM | POA: Diagnosis not present

## 2021-05-08 DIAGNOSIS — Z4801 Encounter for change or removal of surgical wound dressing: Secondary | ICD-10-CM | POA: Insufficient documentation

## 2021-05-08 DIAGNOSIS — Z794 Long term (current) use of insulin: Secondary | ICD-10-CM | POA: Insufficient documentation

## 2021-05-08 DIAGNOSIS — Z79899 Other long term (current) drug therapy: Secondary | ICD-10-CM | POA: Diagnosis not present

## 2021-05-08 DIAGNOSIS — Z5189 Encounter for other specified aftercare: Secondary | ICD-10-CM

## 2021-05-08 NOTE — ED Triage Notes (Signed)
Patient had abdominal surgery yesterday, had stitches placed (does not know what kind of surgery, she said it should be in her chart). Went to the bathroom saw someone blood and thinks a stitch popped open. ?

## 2021-05-09 NOTE — Discharge Instructions (Signed)
Return for any problem. ? ?Your wound has minimal drainage.  This is to be expected. ? ?If you develop fever, increased drainage, pain, or other concerning issue please follow-up closely with Dr. Johney Maine and/or return to the ED for evaluation. ?

## 2021-05-09 NOTE — ED Notes (Signed)
Dressing changed, ABD pad with paper tape applied.  ?

## 2021-05-09 NOTE — ED Notes (Signed)
Pt verbalized understanding discharge instructions ?

## 2021-05-09 NOTE — ED Provider Notes (Signed)
?[facility] DEPT ?Provider Note ? ? ?CSN: [id number] ?Arrival date & time: 05/08/21  2259 ? ?  ? ?History ? ?Chief Complaint  ?Patient presents with  ? Post-op Problem  ? ? ?Mariah [name] is a 61 y.o. female. ? ?61 year old female with prior medical history as detailed below presents for evaluation.  Patient was seen by Dr. Johney Maine 2 days prior.  Patient with day surgery to the lower abdomen. ? ?Reports some bloody drainage from the wound.  She presented for reevaluation. ? ?Prior to evaluation she waited 9 hours in the ED waiting room. ? ?She denies fever.  She denies pain. ? ?She is comfortably watching the coronation event in the [place] on her phone during exam. ? ?The history is provided by the patient and medical records.  ? ?  ? ?Home Medications ?Prior to Admission medications   ?Medication Sig Start Date End Date Taking? Authorizing Provider  ?ADVAIR DISKUS 500-50 MCG/ACT AEPB Inhale 1 puff into the lungs in the morning and at bedtime. 12/31/20   Kozlow, Donnamarie Poag, MD  ?albuterol (VENTOLIN HFA) 108 (90 Base) MCG/ACT inhaler Inhale 1 - 2 puffs into the lungs every 6 hours as needed for wheezing or shortness of breath. 12/31/20   Kozlow, Donnamarie Poag, MD  ?aspirin 81 MG EC tablet Take 81 mg by mouth daily.    [provider]  ?aspirin-acetaminophen-caffeine (EXCEDRIN MIGRAINE) [phone] MG tablet Take 1 tablet by mouth every 6 (six) hours as needed for headache.    [provider]  ?azelastine (ASTELIN) 0.1 % nasal spray Place 1 spray into each nostril 1 - 2 times daily. 12/31/20   Kozlow, Donnamarie Poag, MD  ?Biotin 2500 MCG CAPS Take 2,500 mcg by mouth at bedtime.    [provider]  ?brimonidine (ALPHAGAN P) 0.1 % SOLN Place 1 drop into both eyes 2 (two) times daily.    [provider]  ?cetirizine (ZYRTEC) 10 MG tablet Take 10 mg by mouth at bedtime.    [provider]  ?cholecalciferol (VITAMIN D3) 25 MCG (1000 UNIT) tablet Take 1,000 Units by  mouth at bedtime.    [provider]  ?Continuous Blood Gluc Receiver (FREESTYLE LIBRE 14 DAY READER) DEVI 1 Device by Misc.(Non-Drug; Combo Route) route continuous. 10/26/16   [provider]  ?Continuous Blood Gluc Sensor (FREESTYLE LIBRE 14 DAY SENSOR) MISC FreeStyle Libre 14 Day Sensor kit ? PLACE 1 PATCH EVERY 14 DAYS TO USE WITH READER    [provider]  ?Continuous Blood Gluc Sensor (FREESTYLE LIBRE 3 SENSOR) MISC Inject 1 sensor to the skin every 14 days for continuous glucose monitoring as directed. 01/30/21     ?COVID-19 mRNA bivalent vaccine, Pfizer, (PFIZER COVID-19 VAC BIVALENT) injection Inject into the muscle. 11/21/20   Carlyle Basques, MD  ?Dulaglutide (TRULICITY) 4.5 YN/8.2NF SOPN Inject 4.5 mg into the skin every Sunday. To stop on 05/03/21 per pt 03/04/20   [provider]  ?DULoxetine (CYMBALTA) 60 MG capsule Take 1 capsule by mouth once daily ?Patient taking differently: Take 60 mg by mouth at bedtime. 02/25/21     ?Empagliflozin-metFORMIN HCl ER (SYNJARDY XR) 12.05-998 MG TB24 Take 1 tablet by mouth 2 times daily with meals. 04/21/21     ?glycopyrrolate (ROBINUL) 1 MG tablet Take 1 tablet by mouth 2 times daily. 04/15/21     ?HUMALOG MIX 75/25 KWIKPEN (75-25) 100 UNIT/ML KwikPen Inject 86 units into the skin in the AM and 52 units in the PM 05/05/21     ?  Insulin Lispro Prot & Lispro (HUMALOG MIX 75/25 KWIKPEN) (75-25) 100 UNIT/ML Kwikpen Inject 90 units into the skin before breakfast and 60 units before dinner. ?Patient taking differently: Inject 48-75 Units into the skin See admin instructions. 86  units in the morning (9a),  52  units in the evening before dinner, and 52 units after lunch as needed 01/11/20     ?Insulin Pen Needle (GLOBAL EASE INJECT PEN NEEDLES) 31G X 8 MM MISC USE TO INJECT INSULIN TWICE DAILY 01/23/20     ?Insulin Pen Needle (UNIFINE PENTIPS) 31G X 8 MM MISC Use to inject insulin 2 times a day 03/26/21     ?Insulin Pen Needle (UNIFINE PENTIPS) 32G  X 4 MM MISC Use to inject insulin twice daily 03/25/21     ?lisinopril (ZESTRIL) 20 MG tablet Take 1 tablet by mouth every morning 02/25/21     ?methylphenidate (CONCERTA) 27 MG PO CR tablet Take 1 tablet by mouth every morning 02/09/21     ?methylphenidate (CONCERTA) 27 MG PO CR tablet Take 1 tablet by mouth every morning ?Patient not taking: Reported on 04/24/2021 02/20/21     ?methylphenidate (CONCERTA) 27 MG PO CR tablet Take 1 tablet by mouth every morning ?Patient not taking: Reported on 04/24/2021 02/25/21     ?olopatadine (PATANOL) 0.1 % ophthalmic solution Place 1 drop into both eyes 2 times daily. 12/31/20   Kozlow, Donnamarie Poag, MD  ?pantoprazole (PROTONIX) 40 MG tablet Take 1 tablet by mouth every morning 02/25/21     ?Polyethyl Glycol-Propyl Glycol (SYSTANE) 0.4-0.3 % SOLN Apply 1 drop to eye as needed. ?Patient not taking: Reported on 04/24/2021 12/31/20   Jiles Prows, MD  ?pravastatin (PRAVACHOL) 40 MG tablet Take 1 tablet by mouth at bedtime 02/25/21     ?Probiotic Product (PROBIOTIC DAILY PO) Take 1 capsule by mouth at bedtime.    [provider]  ?pyridoxine (B-6) 100 MG tablet Take 100 mg by mouth at bedtime.    [provider]  ?tirzepatide Darcel Bayley) 5 MG/0.5ML Pen Inject 5 mg (0.5 ml) into the skin every 7 days. 05/01/21     ?tirzepatide (MOUNJARO) 5 MG/0.5ML Pen Inject 5 mg into the skin once a week. To start on 05/03/21.    [provider]  ?traMADol (ULTRAM) 50 MG tablet Take 1-2 tablets (50-100 mg total) by mouth every 6 (six) hours as needed for moderate pain or severe pain. 05/07/21   Michael Boston, MD  ?vitamin B-12 (CYANOCOBALAMIN) 500 MCG tablet Take 500 mcg by mouth at bedtime.    [provider]  ?vitamin C (ASCORBIC ACID) 250 MG tablet Take 250 mg by mouth at bedtime.    [provider]  ?zolpidem (AMBIEN) 10 MG tablet Take 1/2-1 tablet by mouth at bedtime, as needed (30 days) 02/25/21     ?   ? ?Allergies    ?Codeine and Flonase [fluticasone]   ? ?Review  of Systems   ?Review of Systems  ?All other systems reviewed and are negative. ? ?Physical Exam ?Updated Vital Signs ?BP 128/70 (BP Location: Right Arm)   Pulse 87   Temp 98.1 ?F (36.7 ?C) (Oral)   Resp 16   SpO2 98%  ?Physical Exam ?Vitals and nursing note reviewed.  ?Constitutional:   ?   General: She is not in acute distress. ?   Appearance: Normal appearance. She is well-developed.  ?HENT:  ?   Head: Normocephalic and atraumatic.  ?Eyes:  ?   Conjunctiva/sclera: Conjunctivae normal.  ?  Pupils: Pupils are equal, round, and reactive to light.  ?Cardiovascular:  ?   Rate and Rhythm: Normal rate and regular rhythm.  ?   Heart sounds: Normal heart sounds.  ?Pulmonary:  ?   Effort: Pulmonary effort is normal. No respiratory distress.  ?   Breath sounds: Normal breath sounds.  ?Abdominal:  ?   General: There is no distension.  ?   Palpations: Abdomen is soft.  ?   Tenderness: There is no abdominal tenderness.  ?Musculoskeletal:     ?   General: No deformity. Normal range of motion.  ?   Cervical back: Normal range of motion and neck supple.  ?Skin: ?   General: Skin is warm and dry.  ?   Comments: Incision site to the lower abdomen is clean and intact.  Minimal serosanguineous drainage noted.  See photo below.  ?Neurological:  ?   General: No focal deficit present.  ?   Mental Status: She is alert and oriented to person, place, and time.  ? ? ? ?ED Results / Procedures / Treatments   ?Labs ?(all labs ordered are listed, but only abnormal results are displayed) ?Labs Reviewed - No data to display ? ?EKG ?None ? ?Radiology ?No results found. ? ?Procedures ?Procedures  ? ? ?Medications Ordered in ED ?Medications - No data to display ? ?ED Course/ Medical Decision Making/ A&P ?  ?                        ?Medical Decision Making ? ? ?Medical Screen Complete ? ?This patient presented to the ED with complaint of drainage from recent surgical wound. ? ?This complaint involves an extensive number of treatment options. The  initial differential diagnosis includes, but is not limited to, wound check ? ?This presentation is: Acute, self-limited, previously undiagnosed ? ? ?Patient is presenting for wound check after recent sur

## 2021-05-11 LAB — SURGICAL PATHOLOGY

## 2021-05-19 DIAGNOSIS — Z0110 Encounter for examination of ears and hearing without abnormal findings: Secondary | ICD-10-CM | POA: Diagnosis not present

## 2021-05-25 ENCOUNTER — Other Ambulatory Visit (HOSPITAL_COMMUNITY): Payer: Self-pay

## 2021-05-26 ENCOUNTER — Other Ambulatory Visit (HOSPITAL_COMMUNITY): Payer: Self-pay

## 2021-05-28 DIAGNOSIS — H35033 Hypertensive retinopathy, bilateral: Secondary | ICD-10-CM | POA: Diagnosis not present

## 2021-05-28 DIAGNOSIS — H02889 Meibomian gland dysfunction of unspecified eye, unspecified eyelid: Secondary | ICD-10-CM | POA: Diagnosis not present

## 2021-05-28 DIAGNOSIS — H40002 Preglaucoma, unspecified, left eye: Secondary | ICD-10-CM | POA: Diagnosis not present

## 2021-05-28 DIAGNOSIS — H00025 Hordeolum internum left lower eyelid: Secondary | ICD-10-CM | POA: Diagnosis not present

## 2021-05-28 DIAGNOSIS — H401111 Primary open-angle glaucoma, right eye, mild stage: Secondary | ICD-10-CM | POA: Diagnosis not present

## 2021-05-28 DIAGNOSIS — E119 Type 2 diabetes mellitus without complications: Secondary | ICD-10-CM | POA: Diagnosis not present

## 2021-06-15 DIAGNOSIS — G4733 Obstructive sleep apnea (adult) (pediatric): Secondary | ICD-10-CM | POA: Diagnosis not present

## 2021-06-20 ENCOUNTER — Other Ambulatory Visit (HOSPITAL_COMMUNITY): Payer: Self-pay

## 2021-06-26 ENCOUNTER — Other Ambulatory Visit (HOSPITAL_COMMUNITY): Payer: Self-pay

## 2021-06-30 ENCOUNTER — Other Ambulatory Visit (HOSPITAL_COMMUNITY): Payer: Self-pay

## 2021-06-30 ENCOUNTER — Telehealth: Payer: Self-pay

## 2021-06-30 ENCOUNTER — Ambulatory Visit: Payer: 59 | Admitting: Allergy and Immunology

## 2021-06-30 MED ORDER — EPINEPHRINE 0.3 MG/0.3ML IJ SOAJ
0.3000 mg | Freq: Once | INTRAMUSCULAR | 1 refills | Status: AC
  Filled 2021-06-30: qty 2, 30d supply, fill #0

## 2021-06-30 NOTE — Telephone Encounter (Signed)
Sent in epipen refill to [facility]

## 2021-06-30 NOTE — Telephone Encounter (Signed)
Please advise to refill of epi and it seems her lab work to [facility] was negative no other epipen was mentioned thank you

## 2021-06-30 NOTE — Telephone Encounter (Signed)
Patient called requesting a refill on her epi pen. She was scheduled for an appointment on today, but she is stuck at work and can not leave. Patient only wants to see Dr Lucie Leather , so she is scheduled to see him on 10/20/21 for a time that worked for her.    [facility]

## 2021-07-16 ENCOUNTER — Other Ambulatory Visit (HOSPITAL_COMMUNITY): Payer: Self-pay

## 2021-07-17 ENCOUNTER — Other Ambulatory Visit (HOSPITAL_COMMUNITY): Payer: Self-pay

## 2021-07-17 MED ORDER — SYNJARDY XR 12.5-1000 MG PO TB24
ORAL_TABLET | ORAL | 2 refills | Status: DC
  Filled 2021-07-17: qty 60, 30d supply, fill #0

## 2021-07-18 ENCOUNTER — Other Ambulatory Visit (HOSPITAL_COMMUNITY): Payer: Self-pay

## 2021-07-18 MED ORDER — METHYLPHENIDATE HCL ER (OSM) 27 MG PO TBCR
27.0000 mg | EXTENDED_RELEASE_TABLET | Freq: Every morning | ORAL | 0 refills | Status: DC
  Filled 2021-07-18 – 2021-08-13 (×2): qty 90, 90d supply, fill #0

## 2021-07-20 ENCOUNTER — Other Ambulatory Visit (HOSPITAL_COMMUNITY): Payer: Self-pay

## 2021-07-21 ENCOUNTER — Other Ambulatory Visit (HOSPITAL_COMMUNITY): Payer: Self-pay

## 2021-07-23 ENCOUNTER — Other Ambulatory Visit (HOSPITAL_COMMUNITY): Payer: Self-pay

## 2021-07-24 ENCOUNTER — Other Ambulatory Visit (HOSPITAL_COMMUNITY): Payer: Self-pay

## 2021-07-25 ENCOUNTER — Other Ambulatory Visit (HOSPITAL_COMMUNITY): Payer: Self-pay

## 2021-08-05 ENCOUNTER — Other Ambulatory Visit (HOSPITAL_COMMUNITY): Payer: Self-pay

## 2021-08-05 MED ORDER — TRIAMCINOLONE ACETONIDE 0.1 % EX CREA
TOPICAL_CREAM | CUTANEOUS | 0 refills | Status: AC
  Filled 2021-08-05: qty 30, 15d supply, fill #0

## 2021-08-05 MED ORDER — CLOTRIMAZOLE 1 % EX CREA: TOPICAL_CREAM | CUTANEOUS | 0 refills | Status: DC

## 2021-08-10 NOTE — Progress Notes (Unsigned)
Date:  08/11/2021   ID:  Mariah [name], DOB 11-02-1960, MRN [id number]   PCP:  Maury Dus, MD  Sleep Medicine:  Fransico Him, MD Electrophysiologist:  None   Chief Complaint:  OSA  History of Present Illness:    Mariah [name] is a 61 y.o. female with a hx of OSA on PAP, HTN and obesity.  SHe is doing well with her CPAP device and thinks that she has gotten used to it.  She tolerates the mask and feels the pressure is adequate.  She does not feel rested in the am.  She goes to bed at 12MN and falls asleep around 2am because she is reading and gets up at 7am.  She denies any significant nasal dryness or nasal congestion.  She does have some mouth dryness.  She does not think that she snores.     Prior CV studies:   The following studies were reviewed today:  PAP compliance download  Past Medical History:  Diagnosis Date   Allergy    Anemia    Anxiety    Arthritis    Asthma    Body odor    from colostomy takedown   Cervicalgia    Depression    Diabetes mellitus without complication (HCC)    Fibroid    GERD (gastroesophageal reflux disease)    Glaucoma    Headache    Heart murmur    as a child   Hyperlipidemia    Hypertension    Hypothyroidism    Infertility    OSA (obstructive sleep apnea)    upper airway resistance syndrome on CPAP at 9cm H2O   Reflux    Vertigo    Past Surgical History:  Procedure Laterality Date   COLECTOMY WITH COLOSTOMY CREATION/HARTMANN PROCEDURE  02/07/2010   Perforated   COLOSTOMY TAKEDOWN  07/28/2010   Lap colostomy takedown   LAPAROTOMY N/A 05/07/2021   Procedure: ABDOMINAL WALL EXPLORATION, PROBABLE REMOVAL OF FOREIGN BODY;  Surgeon: Michael Boston, MD;  Location: [facility] ORS;  Service: General;  Laterality: N/A;   TONSILLECTOMY     VENTRAL HERNIA REPAIR  02/07/2010   Primary repair of old hernia     Current Meds  Medication Sig   ADVAIR DISKUS 500-50 MCG/ACT AEPB Inhale 1 puff into the lungs in the morning and  at bedtime.   albuterol (VENTOLIN HFA) 108 (90 Base) MCG/ACT inhaler Inhale 1 - 2 puffs into the lungs every 6 hours as needed for wheezing or shortness of breath.   aspirin 81 MG EC tablet Take 81 mg by mouth daily.   aspirin-acetaminophen-caffeine (EXCEDRIN MIGRAINE) 250-250-65 MG tablet Take 1 tablet by mouth every 6 (six) hours as needed for headache.   azelastine (ASTELIN) 0.1 % nasal spray Place 1 spray into each nostril 1 - 2 times daily.   Biotin 2500 MCG CAPS Take 2,500 mcg by mouth at bedtime.   brimonidine (ALPHAGAN P) 0.1 % SOLN Place 1 drop into both eyes 2 (two) times daily.   cetirizine (ZYRTEC) 10 MG tablet Take 10 mg by mouth at bedtime.   cholecalciferol (VITAMIN D3) 25 MCG (1000 UNIT) tablet Take 1,000 Units by mouth at bedtime.   clotrimazole (LOTRIMIN) 1 % cream Apply to affected area 2 times a day for 14 days   Continuous Blood Gluc Receiver (FREESTYLE LIBRE 14 DAY READER) DEVI 1 Device by Misc.(Non-Drug; Combo Route) route continuous.   Continuous Blood Gluc Sensor (FREESTYLE LIBRE 14 DAY SENSOR) MISC FreeStyle Libre 14 Day  Sensor kit  PLACE 1 PATCH EVERY 14 DAYS TO USE WITH READER   Continuous Blood Gluc Sensor (FREESTYLE LIBRE 3 SENSOR) MISC Inject 1 sensor to the skin every 14 days for continuous glucose monitoring as directed.   COVID-19 mRNA bivalent vaccine, Pfizer, (PFIZER COVID-19 VAC BIVALENT) injection Inject into the muscle.   Dulaglutide (TRULICITY) 4.5 ZO/1.0RU SOPN Inject 4.5 mg into the skin every Sunday. To stop on 05/03/21 per pt   DULoxetine (CYMBALTA) 60 MG capsule Take 1 capsule by mouth once daily (Patient taking differently: Take 60 mg by mouth at bedtime.)   Empagliflozin-metFORMIN HCl ER (SYNJARDY XR) 12.05-998 MG TB24 Take 1 tablet by mouth 2 times daily with meals.(Stop separate jardiance and metformin.)   glycopyrrolate (ROBINUL) 1 MG tablet Take 1 tablet by mouth 2 times daily.   HUMALOG MIX 75/25 KWIKPEN (75-25) 100 UNIT/ML KwikPen Inject 86 units  into the skin in the AM and 52 units in the PM   Insulin Lispro Prot & Lispro (HUMALOG MIX 75/25 KWIKPEN) (75-25) 100 UNIT/ML Kwikpen Inject 90 units into the skin before breakfast and 60 units before dinner. (Patient taking differently: Inject 48-75 Units into the skin See admin instructions. 86  units in the morning (9a),  52  units in the evening before dinner, and 52 units after lunch as needed)   Insulin Pen Needle (GLOBAL EASE INJECT PEN NEEDLES) 31G X 8 MM MISC USE TO INJECT INSULIN TWICE DAILY   Insulin Pen Needle (UNIFINE PENTIPS) 31G X 8 MM MISC Use to inject insulin 2 times a day   Insulin Pen Needle (UNIFINE PENTIPS) 32G X 4 MM MISC Use to inject insulin twice daily   lisinopril (ZESTRIL) 20 MG tablet Take 1 tablet by mouth every morning   methylphenidate (CONCERTA) 27 MG PO CR tablet Take 1 tablet by mouth every morning   methylphenidate (CONCERTA) 27 MG PO CR tablet Take 1 tablet by mouth every morning   methylphenidate (CONCERTA) 27 MG PO CR tablet Take 1 tablet by mouth every morning   methylphenidate (CONCERTA) 27 MG PO CR tablet Take 1 tablet (27 mg total) by mouth every morning (may fill 30 days after last Concerta fill)   olopatadine (PATANOL) 0.1 % ophthalmic solution Place 1 drop into both eyes 2 times daily.   pantoprazole (PROTONIX) 40 MG tablet Take 1 tablet by mouth every morning   Polyethyl Glycol-Propyl Glycol (SYSTANE) 0.4-0.3 % SOLN Apply 1 drop to eye as needed.   pravastatin (PRAVACHOL) 40 MG tablet Take 1 tablet by mouth at bedtime   Probiotic Product (PROBIOTIC DAILY PO) Take 1 capsule by mouth at bedtime.   pyridoxine (B-6) 100 MG tablet Take 100 mg by mouth at bedtime.   tirzepatide (MOUNJARO) 5 MG/0.5ML Pen Inject 5 mg (0.5 ml) into the skin every 7 days.   tirzepatide [facility]) 5 MG/0.5ML Pen Inject 5 mg into the skin once a week. To start on 05/03/21.   triamcinolone cream (KENALOG) 0.1 % Apply externally twice a day as needed   vitamin B-12 (CYANOCOBALAMIN)  500 MCG tablet Take 500 mcg by mouth at bedtime.   vitamin C (ASCORBIC ACID) 250 MG tablet Take 250 mg by mouth at bedtime.   zolpidem (AMBIEN) 10 MG tablet Take 1/2-1 tablet by mouth at bedtime, as needed (30 days)     Allergies:   Codeine and Flonase [fluticasone]   Social History   Tobacco Use   Smoking status: Former    Types: Cigarettes    Quit  date: 11/19/1989    Years since quitting: 31.7   Smokeless tobacco: Never  Vaping Use   Vaping Use: Never used  Substance Use Topics   Alcohol use: Yes    Comment: Social   Drug use: No     Family Hx: The patient's family history includes Breast cancer in her paternal grandmother; Cancer in her maternal grandfather; Diabetes in her father and maternal grandmother; Heart disease in her maternal grandmother; Hypertension in her father, maternal grandmother, and mother.  ROS:   Please see the history of present illness.     All other systems reviewed and are negative.   Labs/Other Tests and Data Reviewed:    Recent Labs: 04/30/2021: BUN 16; Creatinine, Ser 0.99; Hemoglobin 13.1; Platelets 292; Potassium 3.7; Sodium 134   Recent Lipid Panel Lab Results  Component Value Date/Time   CHOL  02/07/2010 04:32 AM    72        ATP III CLASSIFICATION:  <200     mg/dL   Desirable  200-239  mg/dL   Borderline High  >=240    mg/dL   High          TRIG 47 02/07/2010 04:32 AM   HDL 36 (L) 02/07/2010 04:32 AM   CHOLHDL 2.0 02/07/2010 04:32 AM   LDLCALC  02/07/2010 04:32 AM    27        Total Cholesterol/HDL:CHD Risk Coronary Heart Disease Risk Table                     Men   Women  1/2 Average Risk   3.4   3.3  Average Risk       5.0   4.4  2 X Average Risk   9.6   7.1  3 X Average Risk  23.4   11.0        Use the calculated Patient Ratio above and the CHD Risk Table to determine the patient's CHD Risk.        ATP III CLASSIFICATION (LDL):  <100     mg/dL   Optimal  100-129  mg/dL   Near or Above                    Optimal   130-159  mg/dL   Borderline  160-189  mg/dL   High  >190     mg/dL   Very High    Wt Readings from Last 3 Encounters:  08/11/21 182 lb 9.6 oz (82.8 kg)  05/07/21 182 lb 1.6 oz (82.6 kg)  04/30/21 182 lb (82.6 kg)     Objective:    Vital Signs:  BP 130/76   Pulse 86   Ht 5' (1.524 m)   Wt 182 lb 9.6 oz (82.8 kg)   SpO2 99%   BMI 35.66 kg/m   GEN: Well nourished, well developed in no acute distress HEENT: Normal NECK: No JVD; No carotid bruits LYMPHATICS: No lymphadenopathy CARDIAC:RRR, no murmurs, rubs, gallops RESPIRATORY:  Clear to auscultation without rales, wheezing or rhonchi  ABDOMEN: Soft, non-tender, non-distended MUSCULOSKELETAL:  No edema; No deformity  SKIN: Warm and dry NEUROLOGIC:  Alert and oriented x 3 PSYCHIATRIC:  Normal affect    ASSESSMENT & PLAN:    1.  OSA - The patient is tolerating PAP therapy well without any problems. The PAP download performed by his DME was personally reviewed and interpreted by me today and showed an AHI of 2.2/hr on 9 cm H2O with  100% compliance in using more than 4 hours nightly.  The patient has been using and benefiting from PAP use and will continue to benefit from therapy.    2.  HTN -BP adequately controlled on exam today -continue prescription drug management with Lisinopril 36m daily with PRN refills  3.  Sinus tachycardia -workup in the past was normal with average HR 101bpm   Medication Adjustments/Labs and Tests Ordered: Current medicines are reviewed at length with the patient today.  Concerns regarding medicines are outlined above.  Tests Ordered: No orders of the defined types were placed in this encounter.  Medication Changes: No orders of the defined types were placed in this encounter.   Disposition:  Follow up in 1 year(s)  Signed, TFransico Him MD  08/11/2021 8:11 AM    [place]

## 2021-08-11 ENCOUNTER — Ambulatory Visit: Payer: 59 | Admitting: Cardiology

## 2021-08-11 ENCOUNTER — Encounter: Payer: Self-pay | Admitting: Cardiology

## 2021-08-11 VITALS — BP 130/76 | HR 86 | Ht 60.0 in | Wt 182.6 lb

## 2021-08-11 DIAGNOSIS — I10 Essential (primary) hypertension: Secondary | ICD-10-CM

## 2021-08-11 DIAGNOSIS — G4733 Obstructive sleep apnea (adult) (pediatric): Secondary | ICD-10-CM | POA: Diagnosis not present

## 2021-08-11 NOTE — Patient Instructions (Signed)
Medication Instructions:  Your physician recommends that you continue on your current medications as directed. Please refer to the Current Medication list given to you today.  *If you need a refill on your cardiac medications before your next appointment, please call your pharmacy*  Follow-Up: At [facility], you and your health needs are our priority.  As part of our continuing mission to provide you with exceptional heart care, we have created designated Provider Care Teams.  These Care Teams include your primary Cardiologist (physician) and Advanced Practice Providers (APPs -  Physician Assistants and Nurse Practitioners) who all work together to provide you with the care you need, when you need it.  Your next appointment:   1 year(s)  The format for your next appointment:   In Person  Provider:   Fransico Him, MD  Important Information About Sugar

## 2021-08-12 ENCOUNTER — Encounter: Payer: Self-pay | Admitting: "Endocrinology

## 2021-08-12 ENCOUNTER — Ambulatory Visit: Payer: 59 | Admitting: "Endocrinology

## 2021-08-12 VITALS — BP 126/80 | HR 80 | Ht 60.0 in | Wt 182.8 lb

## 2021-08-12 DIAGNOSIS — I10 Essential (primary) hypertension: Secondary | ICD-10-CM

## 2021-08-12 DIAGNOSIS — E782 Mixed hyperlipidemia: Secondary | ICD-10-CM | POA: Insufficient documentation

## 2021-08-12 DIAGNOSIS — K7581 Nonalcoholic steatohepatitis (NASH): Secondary | ICD-10-CM | POA: Insufficient documentation

## 2021-08-12 DIAGNOSIS — Z6835 Body mass index (BMI) 35.0-35.9, adult: Secondary | ICD-10-CM | POA: Diagnosis not present

## 2021-08-12 DIAGNOSIS — E1169 Type 2 diabetes mellitus with other specified complication: Secondary | ICD-10-CM | POA: Diagnosis not present

## 2021-08-12 DIAGNOSIS — Z794 Long term (current) use of insulin: Secondary | ICD-10-CM | POA: Diagnosis not present

## 2021-08-12 LAB — HEMOGLOBIN A1C
Hemoglobin A1C: 8
Hemoglobin A1C: 9

## 2021-08-12 MED ORDER — HUMALOG MIX 75/25 KWIKPEN (75-25) 100 UNIT/ML ~~LOC~~ SUPN: 50.0000 [IU] | PEN_INJECTOR | Freq: Two times a day (BID) | SUBCUTANEOUS | 1 refills | Status: DC

## 2021-08-12 MED ORDER — SYNJARDY XR 12.5-1000 MG PO TB24
1.0000 | ORAL_TABLET | Freq: Every day | ORAL | 1 refills | Status: DC
Start: 2021-08-12 — End: 2021-10-19

## 2021-08-12 NOTE — Patient Instructions (Signed)

## 2021-08-12 NOTE — Progress Notes (Signed)
Endocrinology Consult Note       08/12/2021, 11:59 AM   Subjective:    Patient ID: Mariah [name], female    DOB: [birth year].  Mariah [name] is being seen in consultation for management of currently uncontrolled symptomatic diabetes requested by  Maury Dus, MD.   Past Medical History:  Diagnosis Date   Allergy    Anemia    Anxiety    Arthritis    Asthma    Body odor    from colostomy takedown   Cervicalgia    Depression    Diabetes mellitus without complication (HCC)    Fibroid    GERD (gastroesophageal reflux disease)    Glaucoma    Headache    Heart murmur    as a child   Hyperlipidemia    Hypertension    Hypothyroidism    Infertility    OSA (obstructive sleep apnea)    upper airway resistance syndrome on CPAP at 9cm H2O   Reflux    Vertigo     Past Surgical History:  Procedure Laterality Date   COLECTOMY WITH COLOSTOMY CREATION/HARTMANN PROCEDURE  02/07/2010   Perforated   COLOSTOMY TAKEDOWN  07/28/2010   Lap colostomy takedown   LAPAROTOMY N/A 05/07/2021   Procedure: ABDOMINAL WALL EXPLORATION, PROBABLE REMOVAL OF FOREIGN BODY;  Surgeon: Michael Boston, MD;  Location: [facility];  Service: General;  Laterality: N/A;   TONSILLECTOMY     VENTRAL HERNIA REPAIR  02/07/2010   Primary repair of old hernia    Social History   Socioeconomic History   Marital status: Married    Spouse name: Not on file   Number of children: Not on file   Years of education: Not on file   Highest education level: Not on file  Occupational History   Not on file  Tobacco Use   Smoking status: Former    Types: Cigarettes    Quit date: 11/19/1989    Years since quitting: 31.7   Smokeless tobacco: Never  Vaping Use   Vaping Use: Never used  Substance and Sexual Activity   Alcohol use: Yes    Comment: Social   Drug use: No   Sexual activity: Not Currently    Partners: Male     Birth control/protection: Post-menopausal    Comment: 1ST intercourse-17, partners- 90, married- 56 yrs   Other Topics Concern   Not on file  Social History Narrative   Not on file   Social Determinants of Health   Financial Resource Strain: Not on file  Food Insecurity: Not on file  Transportation Needs: Not on file  Physical Activity: Not on file  Stress: Not on file  Social Connections: Not on file    Family History  Problem Relation Age of Onset   Diabetes Mother    Hypertension Mother    Hyperlipidemia Mother    Diabetes Father    Hypertension Father    Heart attack Father    Diabetes Maternal Grandmother    Hypertension Maternal Grandmother    Heart disease Maternal Grandmother    Cancer Maternal Grandfather        COLON  Breast cancer Paternal Grandmother        Age 61's    Outpatient Encounter Medications as of 08/12/2021  Medication Sig   EPINEPHrine 0.3 mg/0.3 mL IJ SOAJ injection Inject 0.3 mg into the muscle as needed for anaphylaxis.   Glucagon, rDNA, (GLUCAGON EMERGENCY) 1 MG KIT Inject into the vein.   Multiple Vitamins-Minerals (OCUVITE PO) Take 1 tablet by mouth daily.   Niacin (VITAMIN B-3 PO) Take 1 tablet by mouth daily.   traMADol (ULTRAM) 50 MG tablet Take 1-2 tablets (50-100 mg total) by mouth every 6 (six) hours as needed for moderate pain or severe pain.   ADVAIR DISKUS 500-50 MCG/ACT AEPB Inhale 1 puff into the lungs in the morning and at bedtime.   albuterol (VENTOLIN HFA) 108 (90 Base) MCG/ACT inhaler Inhale 1 - 2 puffs into the lungs every 6 hours as needed for wheezing or shortness of breath.   aspirin 81 MG EC tablet Take 81 mg by mouth daily.   aspirin-acetaminophen-caffeine (EXCEDRIN MIGRAINE) 250-250-65 MG tablet Take 1 tablet by mouth every 6 (six) hours as needed for headache.   azelastine (ASTELIN) 0.1 % nasal spray Place 1 spray into each nostril 1 - 2 times daily.   Biotin 2500 MCG CAPS Take 2,500 mcg by mouth at bedtime.    brimonidine (ALPHAGAN P) 0.1 % SOLN Place 1 drop into both eyes 2 (two) times daily.   cetirizine (ZYRTEC) 10 MG tablet Take 10 mg by mouth at bedtime. (Patient not taking: Reported on 08/12/2021)   cholecalciferol (VITAMIN D3) 25 MCG (1000 UNIT) tablet Take 1,000 Units by mouth at bedtime.   Continuous Blood Gluc Sensor (FREESTYLE LIBRE 3 SENSOR) MISC Inject 1 sensor to the skin every 14 days for continuous glucose monitoring as directed.   COVID-19 mRNA bivalent vaccine, Pfizer, (PFIZER COVID-19 VAC BIVALENT) injection Inject into the muscle.   DULoxetine (CYMBALTA) 60 MG capsule Take 1 capsule by mouth once daily (Patient taking differently: Take 60 mg by mouth at bedtime.)   Empagliflozin-metFORMIN HCl ER (SYNJARDY XR) 12.05-998 MG TB24 Take 1 tablet by mouth daily after breakfast.   glycopyrrolate (ROBINUL) 1 MG tablet Take 1 tablet by mouth 2 times daily.   HUMALOG MIX 75/25 KWIKPEN (75-25) 100 UNIT/ML KwikPen Inject 50 Units into the skin 2 (two) times daily before a meal.   Insulin Pen Needle (GLOBAL EASE INJECT PEN NEEDLES) 31G X 8 MM MISC USE TO INJECT INSULIN TWICE DAILY   Insulin Pen Needle (UNIFINE PENTIPS) 31G X 8 MM MISC Use to inject insulin 2 times a day   Insulin Pen Needle (UNIFINE PENTIPS) 32G X 4 MM MISC Use to inject insulin twice daily   lisinopril (ZESTRIL) 20 MG tablet Take 1 tablet by mouth every morning   methylphenidate (CONCERTA) 27 MG PO CR tablet Take 1 tablet by mouth every morning   methylphenidate (CONCERTA) 27 MG PO CR tablet Take 1 tablet by mouth every morning   methylphenidate (CONCERTA) 27 MG PO CR tablet Take 1 tablet by mouth every morning   methylphenidate (CONCERTA) 27 MG PO CR tablet Take 1 tablet (27 mg total) by mouth every morning (may fill 30 days after last Concerta fill)   olopatadine (PATANOL) 0.1 % ophthalmic solution Place 1 drop into both eyes 2 times daily.   pantoprazole (PROTONIX) 40 MG tablet Take 1 tablet by mouth every morning   Polyethyl  Glycol-Propyl Glycol (SYSTANE) 0.4-0.3 % SOLN Apply 1 drop to eye as needed.   pravastatin (PRAVACHOL)  40 MG tablet Take 1 tablet by mouth at bedtime   Probiotic Product (PROBIOTIC DAILY PO) Take 1 capsule by mouth at bedtime.   tirzepatide (MOUNJARO) 5 MG/0.5ML Pen Inject 5 mg (0.5 ml) into the skin every 7 days.   tirzepatide [facility]) 5 MG/0.5ML Pen Inject 5 mg into the skin once a week. To start on 05/03/21.   triamcinolone cream (KENALOG) 0.1 % Apply externally twice a day as needed   vitamin B-12 (CYANOCOBALAMIN) 500 MCG tablet Take 500 mcg by mouth at bedtime.   vitamin C (ASCORBIC ACID) 250 MG tablet Take 250 mg by mouth at bedtime.   zolpidem (AMBIEN) 10 MG tablet Take 1/2-1 tablet by mouth at bedtime, as needed (30 days)   [DISCONTINUED] clotrimazole (LOTRIMIN) 1 % cream Apply to affected area 2 times a day for 14 days   [DISCONTINUED] Continuous Blood Gluc Receiver (FREESTYLE LIBRE 14 DAY READER) DEVI 1 Device by Misc.(Non-Drug; Combo Route) route continuous.   [DISCONTINUED] Continuous Blood Gluc Sensor (FREESTYLE LIBRE 14 DAY SENSOR) MISC FreeStyle Libre 14 Day Sensor kit  PLACE 1 PATCH EVERY 14 DAYS TO USE WITH READER   [DISCONTINUED] Dulaglutide (TRULICITY) 4.5 MV/7.8IO SOPN Inject 4.5 mg into the skin every Sunday. To stop on 05/03/21 per pt   [DISCONTINUED] Empagliflozin-metFORMIN HCl ER (SYNJARDY XR) 12.05-998 MG TB24 Take 1 tablet by mouth 2 times daily with meals.(Stop separate jardiance and metformin.)   [DISCONTINUED] HUMALOG MIX 75/25 KWIKPEN (75-25) 100 UNIT/ML KwikPen Inject 86 units into the skin in the AM and 52 units in the PM   [DISCONTINUED] Insulin Lispro Prot & Lispro (HUMALOG MIX 75/25 KWIKPEN) (75-25) 100 UNIT/ML Kwikpen Inject 90 units into the skin before breakfast and 60 units before dinner. (Patient taking differently: Inject 48-75 Units into the skin See admin instructions. 86  units in the morning (9a),  52  units in the evening before dinner, and 52 units after  lunch as needed)   [DISCONTINUED] pyridoxine (B-6) 100 MG tablet Take 100 mg by mouth at bedtime.   No facility-administered encounter medications on file as of 08/12/2021.    ALLERGIES: Allergies  Allergen Reactions   Codeine Hives and Swelling   Flonase [Fluticasone]     Nose bleeds    Other Other (See Comments)    mushrooms    VACCINATION STATUS: Immunization History  Administered Date(s) Administered   Moderna Sars-Covid-2 Vaccination 01/03/2019, 02/03/2019   Pfizer Covid-19 Vaccine Bivalent Booster 72yr & up 11/21/2020    Diabetes She presents for her initial diabetic visit. She has type 2 diabetes mellitus. Onset time: She was diagnosed at approximate age of 89years. There are no hypoglycemic associated symptoms. Pertinent negatives for hypoglycemia include no confusion, headaches, pallor or seizures. Associated symptoms include fatigue, polydipsia and polyuria. Pertinent negatives for diabetes include no chest pain and no polyphagia. There are no hypoglycemic complications. Symptoms are worsening. (On repeat condition [place] obesity, hyperlipidemia, hypertension, MASLD, obstructive sleep apnea.) Risk factors for coronary artery disease include dyslipidemia, diabetes mellitus, hypertension, obesity, family history, tobacco exposure, sedentary lifestyle and post-menopausal. Current diabetic treatments: She is currently on Mounjaro 5 mg weekly, Humalog 75/25 86 units a.m., 52 units p.m., Synjardy 12.05/998 mg p.o. twice daily. Her weight is increasing steadily. She is following a generally unhealthy diet. When asked about meal planning, she reported none. She participates in exercise intermittently. Her home blood glucose trend is fluctuating minimally. Her breakfast blood glucose range is generally 140-180 mg/dl. Her lunch blood glucose range is generally 180-200  mg/dl. Her dinner blood glucose range is generally 180-200 mg/dl. Her bedtime blood glucose range is generally 180-200 mg/dl.  Her overall blood glucose range is 180-200 mg/dl. (She brought her in for that Libre CGM device.  She has 63% time range, level 1 hypoglycemia 29%, unable to hypoglycemia 8%.  No hypoglycemia.  Average blood glucose is 170 for the last 14 days.  Her point-of-care A1c is 8% today August 12, 2021.) An ACE inhibitor/angiotensin II receptor blocker is being taken.  Hyperlipidemia This is a chronic problem. The current episode started more than 1 year ago. The problem is uncontrolled. Pertinent negatives include no chest pain, myalgias or shortness of breath. Current antihyperlipidemic treatment includes statins. Risk factors for coronary artery disease include dyslipidemia, diabetes mellitus, family history, obesity, hypertension, a sedentary lifestyle and post-menopausal.  Hypertension This is a chronic problem. The current episode started more than 1 year ago. The problem is controlled. Pertinent negatives include no chest pain, headaches, palpitations or shortness of breath. Risk factors for coronary artery disease include dyslipidemia, diabetes mellitus, obesity, sedentary lifestyle, smoking/tobacco exposure, family history and post-menopausal state. Past treatments include ACE inhibitors.     Review of Systems  Constitutional:  Positive for fatigue. Negative for chills, fever and unexpected weight change.  HENT:  Negative for trouble swallowing and voice change.   Eyes:  Negative for visual disturbance.  Respiratory:  Negative for cough, shortness of breath and wheezing.   Cardiovascular:  Negative for chest pain, palpitations and leg swelling.  Gastrointestinal:  Negative for diarrhea, nausea and vomiting.  Endocrine: Positive for polydipsia and polyuria. Negative for cold intolerance, heat intolerance and polyphagia.  Musculoskeletal:  Negative for arthralgias and myalgias.  Skin:  Negative for color change, pallor, rash and wound.  Neurological:  Negative for seizures and headaches.   Psychiatric/Behavioral:  Negative for confusion and suicidal ideas.     Objective:       08/12/2021    8:13 AM 08/11/2021    8:03 AM 05/09/2021    7:32 AM  Vitals with BMI  Height 5' 0"  5' 0"    Weight 182 lbs 13 oz 182 lbs 10 oz   BMI 40.9 73.53   Systolic 299 242 683  Diastolic 80 76 70  Pulse 80 86 87    BP 126/80   Pulse 80   Ht 5' (1.524 m)   Wt 182 lb 12.8 oz (82.9 kg)   BMI 35.70 kg/m   Wt Readings from Last 3 Encounters:  08/12/21 182 lb 12.8 oz (82.9 kg)  08/11/21 182 lb 9.6 oz (82.8 kg)  05/07/21 182 lb 1.6 oz (82.6 kg)     Physical Exam    CMP ( most recent) CMP     Component Value Date/Time   NA 134 (L) 04/30/2021 0855   K 3.7 04/30/2021 0855   CL 103 04/30/2021 0855   CO2 25 04/30/2021 0855   GLUCOSE 145 (H) 04/30/2021 0855   BUN 16 04/30/2021 0855   CREATININE 0.99 04/30/2021 0855   CALCIUM 9.1 04/30/2021 0855   PROT 6.1 02/12/2010 0400   ALBUMIN 2.3 (L) 02/12/2010 0400   AST 33 02/12/2010 0400   ALT 23 02/12/2010 0400   ALKPHOS 47 02/12/2010 0400   BILITOT 1.1 02/12/2010 0400   GFRNONAA >60 04/30/2021 0855   GFRAA >60 07/31/2010 0430     Diabetic Labs (most recent): Lab Results  Component Value Date   HGBA1C 9 08/12/2021   HGBA1C 8 08/12/2021   HGBA1C 8.6 (H)  04/30/2021     Lipid Panel ( most recent) Lipid Panel     Component Value Date/Time   CHOL 146 02/05/2021 0000   TRIG 285 (A) 02/05/2021 0000   HDL 29 (A) 02/05/2021 0000   CHOLHDL 2.0 02/07/2010 0432   VLDL 9 02/07/2010 0432   LDLCALC 98 02/05/2021 0000      Lab Results  Component Value Date   TSH 0.727 10/11/2017           Assessment & Plan:   1. Type 2 diabetes mellitus with other specified complication, with long-term current use of insulin ([place])  - Mariah [name] has currently uncontrolled symptomatic type 2 DM since  61 years of age,  with most recent A1c of 8 %. Recent labs reviewed. - I had a long discussion with her about the progressive  nature of diabetes and the pathology behind its complications. -her diabetes is complicated by obesity/sedentary life, polypharmacy, obstructive sleep apnea, comorbid conditions of hyperlipidemia, hypertension and she remains at a high risk for more acute and chronic complications which include CAD, CVA, CKD, retinopathy, and neuropathy. These are all discussed in detail with her.  - I discussed all available options of managing her diabetes including de-escalation of medications. I have counseled her on diet  and weight management  by adopting a Whole Food , Plant Predominant  ( WFPP) nutrition as recommended by [employer] of Lifestyle Medicine. Patient is encouraged to switch to  unprocessed or minimally processed  complex starch, adequate protein intake (mainly plant source), minimal liquid fat ( mainly vegetable oils), plenty of fruits, and vegetables. -  she is advised to stick to a routine mealtimes to eat 3 complete meals a day and snack only when necessary ( to snack only to correct hypoglycemia BG <70 day time or <100 at night).   - she acknowledges that there is a room for improvement in her food and drink choices. - Further Specific Suggestion is made for her to avoid simple carbohydrates  from her diet including Cakes, Sweet Desserts, Ice Cream, Soda (diet and regular), Sweet Tea, Candies, Chips, Cookies, Store Bought Juices, Alcohol ,  Artificial Sweeteners,  Coffee Creamer, and "Sugar-free" Products. This will help patient to have more stable blood glucose profile and potentially avoid unintended weight gain.  The following Lifestyle Medicine recommendations according to [facility]) were discussed and offered to patient and she agrees to start the journey:  A. Whole Foods, Plant-based plate comprising of fruits and vegetables, plant-based proteins, whole-grain carbohydrates was discussed in detail with the patient.   A list for source of those nutrients  were also provided to the patient.  Patient will use only water or unsweetened tea for hydration. B.  The need to stay away from risky substances including alcohol, smoking; obtaining 7 to 9 hours of restorative sleep, at least 150 minutes of moderate intensity exercise weekly, the importance of healthy social connections,  and stress reduction techniques were discussed. C.  A full color page of  Calorie density of various food groups per pound showing examples of each food groups was provided to the patient.  - she will be scheduled with Jearld Fenton, RDN, CDE for individualized diabetes education.  - I have approached her with the following plan to manage  her diabetes and patient agrees:   - she will benefit from de-escalation of medications.  I discussed and lowered her Humalog 75/25 to 50 units with breakfast and 50 units  with supper only when [place] blood glucose readings are above 90 mg per DL, associated with continuous monitoring of blood glucose using her CGM.   - she is warned not to take insulin without proper monitoring per orders. - Adjustment parameters are given to her for hypo and hyperglycemia in writing. - she is encouraged to call clinic for blood glucose levels less than 70 or above 200 mg /dl. - she is advised to lower Synjardy to 12.05/998 mg XR p.o. daily at breakfast .  She is currently taking that twice a day.   She is advised to finish and discontinue Trulicity, advised to continue Mounjaro 5 mg subcutaneously weekly.   - Specific targets for  A1c;  LDL, HDL,  and Triglycerides were discussed with the patient.  2) Blood Pressure /Hypertension:  her blood pressure is  controlled to target.   she is advised to continue her current medications including lisinopril 20 mg p.o. daily with breakfast . 3) Lipids/Hyperlipidemia:   Review of her recent lipid panel showed un controlled  LDL at 98 .  she  is advised to continue    pravastatin 40 mg daily at bedtime.  Side effects  and precautions discussed with her.  Multiple plant-based diet discussed and recommended above will help with dyslipidemia as well.   4)  Weight/Diet:  Body mass index is 35.7 kg/m.  -   clearly complicating her diabetes care.   she is  a candidate for weight loss. I discussed with her the fact that loss of 5 - 10% of her  current body weight will have the most impact on her diabetes management.  The above detailed  ACLM recommendations for nutrition, exercise, sleep, social life, avoidance of risky substances, the need for restorative sleep   information will also detailed on discharge instructions.  5) Chronic Care/Health Maintenance:  -she  is on ACEI/ARB and Statin medications and  is encouraged to initiate and continue to follow up with Ophthalmology, Dentist,  Podiatrist at least yearly or according to recommendations, and advised to   stay away from smoking. I have recommended yearly flu vaccine and pneumonia vaccine at least every 5 years; moderate intensity exercise for up to 150 minutes weekly; and  sleep for 7- 9 hours a day.  This patient has previous documentation of fatty liver.  She has hypoalbuminemia, elevated PTH, will need repeat/surveillance liver ultrasound to assess the degree of her liver dysfunction.  - she is  advised to maintain close follow up with Maury Dus, MD for primary care needs, as well as her other providers for optimal and coordinated care.   I spent 65 minutes in the care of the patient today including review of labs from [place], Lipids, Thyroid Function, Hematology (current and previous including abstractions from other facilities); face-to-face time discussing  her blood glucose readings/logs, discussing hypoglycemia and hyperglycemia episodes and symptoms, medications doses, her options of short and long term treatment based on the latest standards of care / guidelines;  discussion about incorporating lifestyle medicine;  and documenting the encounter. Risk  reduction counseling performed per USPSTF guidelines to reduce obesity and cardiovascular risk factors.      Please refer to Patient Instructions for Blood Glucose Monitoring and Insulin/Medications Dosing Guide"  in media tab for additional information. Please  also refer to " Patient Self Inventory" in the Media  tab for reviewed elements of pertinent patient history.  Mariah [name] participated in the discussions, expressed understanding, and voiced agreement with the  above plans.  All questions were answered to her satisfaction. she is encouraged to contact clinic should she have any questions or concerns prior to her return visit.   Follow up plan: - Return in about 2 weeks (around 08/26/2021) for F/U with Meter/CGM /Logs Only - no Labs.  Glade Lloyd, MD [facility] [address] [place], [place] [address] Phone: [phone]  Fax: [phone]    08/12/2021, 11:59 AM  This note was partially dictated with voice recognition software. Similar sounding words can be transcribed inadequately or may not  be corrected upon review.

## 2021-08-13 ENCOUNTER — Other Ambulatory Visit (HOSPITAL_COMMUNITY): Payer: Self-pay

## 2021-08-13 ENCOUNTER — Encounter: Payer: Self-pay | Admitting: "Endocrinology

## 2021-08-13 ENCOUNTER — Other Ambulatory Visit: Payer: Self-pay

## 2021-08-13 DIAGNOSIS — Z794 Long term (current) use of insulin: Secondary | ICD-10-CM

## 2021-08-13 MED ORDER — GLUCAGON EMERGENCY 1 MG IJ KIT
PACK | INTRAMUSCULAR | 0 refills | Status: DC
  Filled 2021-08-13: qty 1, 1d supply, fill #0

## 2021-08-14 ENCOUNTER — Other Ambulatory Visit (HOSPITAL_COMMUNITY): Payer: Self-pay

## 2021-08-18 ENCOUNTER — Ambulatory Visit (HOSPITAL_COMMUNITY)
Admission: RE | Admit: 2021-08-18 | Discharge: 2021-08-18 | Disposition: A | Discharge status: Home / Self Care | Payer: 59 | Source: Ambulatory Visit | Attending: "Endocrinology | Admitting: "Endocrinology

## 2021-08-18 DIAGNOSIS — K760 Fatty (change of) liver, not elsewhere classified: Secondary | ICD-10-CM | POA: Diagnosis not present

## 2021-08-18 DIAGNOSIS — Z794 Long term (current) use of insulin: Secondary | ICD-10-CM | POA: Insufficient documentation

## 2021-08-18 DIAGNOSIS — E1169 Type 2 diabetes mellitus with other specified complication: Secondary | ICD-10-CM | POA: Diagnosis not present

## 2021-08-19 ENCOUNTER — Other Ambulatory Visit (HOSPITAL_COMMUNITY): Payer: Self-pay

## 2021-08-20 ENCOUNTER — Other Ambulatory Visit (HOSPITAL_COMMUNITY): Payer: Self-pay

## 2021-08-24 ENCOUNTER — Other Ambulatory Visit (HOSPITAL_COMMUNITY): Payer: Self-pay

## 2021-08-26 ENCOUNTER — Encounter: Payer: Self-pay | Admitting: "Endocrinology

## 2021-08-26 ENCOUNTER — Ambulatory Visit: Payer: 59 | Admitting: "Endocrinology

## 2021-08-26 VITALS — BP 118/78 | HR 96 | Ht 60.0 in | Wt 178.4 lb

## 2021-08-26 DIAGNOSIS — E782 Mixed hyperlipidemia: Secondary | ICD-10-CM

## 2021-08-26 DIAGNOSIS — E1169 Type 2 diabetes mellitus with other specified complication: Secondary | ICD-10-CM | POA: Diagnosis not present

## 2021-08-26 DIAGNOSIS — K760 Fatty (change of) liver, not elsewhere classified: Secondary | ICD-10-CM | POA: Diagnosis not present

## 2021-08-26 DIAGNOSIS — I10 Essential (primary) hypertension: Secondary | ICD-10-CM | POA: Diagnosis not present

## 2021-08-26 DIAGNOSIS — Z794 Long term (current) use of insulin: Secondary | ICD-10-CM

## 2021-08-26 MED ORDER — HUMALOG MIX 75/25 KWIKPEN (75-25) 100 UNIT/ML ~~LOC~~ SUPN: 60.0000 [IU] | PEN_INJECTOR | Freq: Two times a day (BID) | SUBCUTANEOUS | 2 refills | Status: DC

## 2021-08-26 NOTE — Patient Instructions (Signed)

## 2021-08-26 NOTE — Progress Notes (Signed)
08/26/2021, 10:16 AM  Endocrinology follow-up note   Subjective:    Patient ID: Mariah [name], female    DOB: [birth year].  Mariah [name] is being seen in follow-up after she was seen in consultation for management of currently uncontrolled symptomatic diabetes requested by  Maury Dus, MD.   Past Medical History:  Diagnosis Date   Allergy    Anemia    Anxiety    Arthritis    Asthma    Body odor    from colostomy takedown   Cervicalgia    Depression    Diabetes mellitus without complication (HCC)    Fibroid    GERD (gastroesophageal reflux disease)    Glaucoma    Headache    Heart murmur    as a child   Hyperlipidemia    Hypertension    Hypothyroidism    Infertility    OSA (obstructive sleep apnea)    upper airway resistance syndrome on CPAP at 9cm H2O   Reflux    Vertigo     Past Surgical History:  Procedure Laterality Date   COLECTOMY WITH COLOSTOMY CREATION/HARTMANN PROCEDURE  02/07/2010   Perforated   COLOSTOMY TAKEDOWN  07/28/2010   Lap colostomy takedown   LAPAROTOMY N/A 05/07/2021   Procedure: ABDOMINAL WALL EXPLORATION, PROBABLE REMOVAL OF FOREIGN BODY;  Surgeon: Michael Boston, MD;  Location: [facility];  Service: General;  Laterality: N/A;   TONSILLECTOMY     VENTRAL HERNIA REPAIR  02/07/2010   Primary repair of old hernia    Social History   Socioeconomic History   Marital status: Married    Spouse name: Not on file   Number of children: Not on file   Years of education: Not on file   Highest education level: Not on file  Occupational History   Not on file  Tobacco Use   Smoking status: Former    Types: Cigarettes    Quit date: 11/19/1989    Years since quitting: 31.7   Smokeless tobacco: Never  Vaping Use   Vaping Use: Never used  Substance and Sexual Activity   Alcohol use: Yes    Comment: Social   Drug use: No   Sexual activity: Not  Currently    Partners: Male    Birth control/protection: Post-menopausal    Comment: 1ST intercourse-17, partners- 46, married- 25 yrs   Other Topics Concern   Not on file  Social History Narrative   Not on file   Social Determinants of Health   Financial Resource Strain: Not on file  Food Insecurity: Not on file  Transportation Needs: Not on file  Physical Activity: Not on file  Stress: Not on file  Social Connections: Not on file    Family History  Problem Relation Age of Onset   Diabetes Mother    Hypertension Mother    Hyperlipidemia Mother    Diabetes Father    Hypertension Father    Heart attack Father    Diabetes Maternal Grandmother    Hypertension Maternal Grandmother    Heart disease Maternal Grandmother    Cancer Maternal Grandfather        COLON   Breast cancer Paternal Grandmother  Age 14's    Outpatient Encounter Medications as of 08/26/2021  Medication Sig   ADVAIR DISKUS 500-50 MCG/ACT AEPB Inhale 1 puff into the lungs in the morning and at bedtime.   albuterol (VENTOLIN HFA) 108 (90 Base) MCG/ACT inhaler Inhale 1 - 2 puffs into the lungs every 6 hours as needed for wheezing or shortness of breath.   aspirin 81 MG EC tablet Take 81 mg by mouth daily.   aspirin-acetaminophen-caffeine (EXCEDRIN MIGRAINE) 250-250-65 MG tablet Take 1 tablet by mouth every 6 (six) hours as needed for headache.   azelastine (ASTELIN) 0.1 % nasal spray Place 1 spray into each nostril 1 - 2 times daily.   Biotin 2500 MCG CAPS Take 2,500 mcg by mouth at bedtime.   brimonidine (ALPHAGAN P) 0.1 % SOLN Place 1 drop into both eyes 2 (two) times daily.   cetirizine (ZYRTEC) 10 MG tablet Take 10 mg by mouth at bedtime. (Patient not taking: Reported on 08/12/2021)   cholecalciferol (VITAMIN D3) 25 MCG (1000 UNIT) tablet Take 1,000 Units by mouth at bedtime.   Continuous Blood Gluc Sensor (FREESTYLE LIBRE 3 SENSOR) MISC Inject 1 sensor to the skin every 14 days for continuous glucose  monitoring as directed.   COVID-19 mRNA bivalent vaccine, Pfizer, (PFIZER COVID-19 VAC BIVALENT) injection Inject into the muscle.   DULoxetine (CYMBALTA) 60 MG capsule Take 1 capsule by mouth once daily (Patient taking differently: Take 60 mg by mouth at bedtime.)   Empagliflozin-metFORMIN HCl ER (SYNJARDY XR) 12.05-998 MG TB24 Take 1 tablet by mouth daily after breakfast.   EPINEPHrine 0.3 mg/0.3 mL IJ SOAJ injection Inject 0.3 mg into the muscle as needed for anaphylaxis.   Glucagon, rDNA, (GLUCAGON EMERGENCY) 1 MG KIT Use as directed.   glycopyrrolate (ROBINUL) 1 MG tablet Take 1 tablet by mouth 2 times daily.   HUMALOG MIX 75/25 KWIKPEN (75-25) 100 UNIT/ML KwikPen Inject 60 Units into the skin 2 (two) times daily before a meal.   Insulin Pen Needle (GLOBAL EASE INJECT PEN NEEDLES) 31G X 8 MM MISC USE TO INJECT INSULIN TWICE DAILY   Insulin Pen Needle (UNIFINE PENTIPS) 31G X 8 MM MISC Use to inject insulin 2 times a day   Insulin Pen Needle (UNIFINE PENTIPS) 32G X 4 MM MISC Use to inject insulin twice daily   lisinopril (ZESTRIL) 20 MG tablet Take 1 tablet by mouth every morning   methylphenidate (CONCERTA) 27 MG PO CR tablet Take 1 tablet by mouth every morning   methylphenidate (CONCERTA) 27 MG PO CR tablet Take 1 tablet by mouth every morning   methylphenidate (CONCERTA) 27 MG PO CR tablet Take 1 tablet by mouth every morning   methylphenidate (CONCERTA) 27 MG PO CR tablet Take 1 tablet (27 mg total) by mouth every morning   Multiple Vitamins-Minerals (OCUVITE PO) Take 1 tablet by mouth daily.   Niacin (VITAMIN B-3 PO) Take 1 tablet by mouth daily.   olopatadine (PATANOL) 0.1 % ophthalmic solution Place 1 drop into both eyes 2 times daily.   pantoprazole (PROTONIX) 40 MG tablet Take 1 tablet by mouth every morning   Polyethyl Glycol-Propyl Glycol (SYSTANE) 0.4-0.3 % SOLN Apply 1 drop to eye as needed.   pravastatin (PRAVACHOL) 40 MG tablet Take 1 tablet by mouth at bedtime   Probiotic  Product (PROBIOTIC DAILY PO) Take 1 capsule by mouth at bedtime.   tirzepatide (MOUNJARO) 5 MG/0.5ML Pen Inject 5 mg (0.5 ml) into the skin every 7 days.   tirzepatide [facility]) 5  MG/0.5ML Pen Inject 5 mg into the skin once a week. To start on 05/03/21.   traMADol (ULTRAM) 50 MG tablet Take 1-2 tablets (50-100 mg total) by mouth every 6 (six) hours as needed for moderate pain or severe pain.   triamcinolone cream (KENALOG) 0.1 % Apply externally twice a day as needed   vitamin B-12 (CYANOCOBALAMIN) 500 MCG tablet Take 500 mcg by mouth at bedtime.   vitamin C (ASCORBIC ACID) 250 MG tablet Take 250 mg by mouth at bedtime.   zolpidem (AMBIEN) 10 MG tablet Take 1/2-1 tablet by mouth at bedtime, as needed (30 days)   [DISCONTINUED] HUMALOG MIX 75/25 KWIKPEN (75-25) 100 UNIT/ML KwikPen Inject 50 Units into the skin 2 (two) times daily before a meal.   No facility-administered encounter medications on file as of 08/26/2021.    ALLERGIES: Allergies  Allergen Reactions   Codeine Hives and Swelling   Flonase [Fluticasone]     Nose bleeds    Other Other (See Comments)    mushrooms    VACCINATION STATUS: Immunization History  Administered Date(s) Administered   Moderna Sars-Covid-2 Vaccination 01/03/2019, 02/03/2019   Pfizer Covid-19 Vaccine Bivalent Booster 43yr & up 11/21/2020    Diabetes She presents for her follow-up diabetic visit. She has type 2 diabetes mellitus. Onset time: She was diagnosed at approximate age of 89years. Her disease course has been improving. There are no hypoglycemic associated symptoms. Pertinent negatives for hypoglycemia include no confusion, headaches, pallor or seizures. Associated symptoms include fatigue, polydipsia and polyuria. Pertinent negatives for diabetes include no chest pain and no polyphagia. There are no hypoglycemic complications. Symptoms are improving. (On repeat condition [place] obesity, hyperlipidemia, hypertension, MASLD, obstructive sleep  apnea.) Risk factors for coronary artery disease include dyslipidemia, diabetes mellitus, hypertension, obesity, family history, tobacco exposure, sedentary lifestyle and post-menopausal. Current diabetic treatments: She is currently on Mounjaro 5 mg weekly, Humalog 75/25 50 units a.m., 50 units p.m., Synjardy 12.05/998 mg p.o. once a day at breakfast. Her weight is increasing steadily. She is following a generally unhealthy diet. When asked about meal planning, she reported none. She participates in exercise intermittently. Her home blood glucose trend is decreasing steadily. Her breakfast blood glucose range is generally 140-180 mg/dl. Her lunch blood glucose range is generally 180-200 mg/dl. Her dinner blood glucose range is generally 180-200 mg/dl. Her bedtime blood glucose range is generally 180-200 mg/dl. Her overall blood glucose range is 180-200 mg/dl. (She presents with her CGM device.  Her AGP report shows 44% time range, 56% slightly above range.  She has no hypoglycemia.  Her recent A1c was 8%.   ) An ACE inhibitor/angiotensin II receptor blocker is being taken.  Hyperlipidemia This is a chronic problem. The current episode started more than 1 year ago. The problem is uncontrolled. Pertinent negatives include no chest pain, myalgias or shortness of breath. Current antihyperlipidemic treatment includes statins. Risk factors for coronary artery disease include dyslipidemia, diabetes mellitus, family history, obesity, hypertension, a sedentary lifestyle and post-menopausal.  Hypertension This is a chronic problem. The current episode started more than 1 year ago. The problem is controlled. Pertinent negatives include no chest pain, headaches, palpitations or shortness of breath. Risk factors for coronary artery disease include dyslipidemia, diabetes mellitus, obesity, sedentary lifestyle, smoking/tobacco exposure, family history and post-menopausal state. Past treatments include ACE inhibitors.      Review of Systems  Constitutional:  Positive for fatigue. Negative for chills, fever and unexpected weight change.  HENT:  Negative for trouble swallowing  and voice change.   Eyes:  Negative for visual disturbance.  Respiratory:  Negative for cough, shortness of breath and wheezing.   Cardiovascular:  Negative for chest pain, palpitations and leg swelling.  Gastrointestinal:  Negative for diarrhea, nausea and vomiting.  Endocrine: Positive for polydipsia and polyuria. Negative for cold intolerance, heat intolerance and polyphagia.  Musculoskeletal:  Negative for arthralgias and myalgias.  Skin:  Negative for color change, pallor, rash and wound.  Neurological:  Negative for seizures and headaches.  Psychiatric/Behavioral:  Negative for confusion and suicidal ideas.     Objective:       08/26/2021    9:30 AM 08/12/2021    8:13 AM 08/11/2021    8:03 AM  Vitals with BMI  Height 5' 0"  5' 0"  5' 0"   Weight 178 lbs 6 oz 182 lbs 13 oz 182 lbs 10 oz  BMI 34.84 11.7 35.67  Systolic 014 103 013  Diastolic 78 80 76  Pulse 96 80 86    BP 118/78   Pulse 96   Ht 5' (1.524 m)   Wt 178 lb 6.4 oz (80.9 kg)   BMI 34.84 kg/m   Wt Readings from Last 3 Encounters:  08/26/21 178 lb 6.4 oz (80.9 kg)  08/12/21 182 lb 12.8 oz (82.9 kg)  08/11/21 182 lb 9.6 oz (82.8 kg)     Physical Exam    CMP ( most recent) CMP     Component Value Date/Time   NA 134 (L) 04/30/2021 0855   K 3.7 04/30/2021 0855   CL 103 04/30/2021 0855   CO2 25 04/30/2021 0855   GLUCOSE 145 (H) 04/30/2021 0855   BUN 16 04/30/2021 0855   CREATININE 0.99 04/30/2021 0855   CALCIUM 9.1 04/30/2021 0855   PROT 6.1 02/12/2010 0400   ALBUMIN 2.3 (L) 02/12/2010 0400   AST 33 02/12/2010 0400   ALT 23 02/12/2010 0400   ALKPHOS 47 02/12/2010 0400   BILITOT 1.1 02/12/2010 0400   GFRNONAA >60 04/30/2021 0855   GFRAA >60 07/31/2010 0430     Diabetic Labs (most recent): Lab Results  Component Value Date   HGBA1C 9  08/12/2021   HGBA1C 8 08/12/2021   HGBA1C 8.6 (H) 04/30/2021     Lipid Panel ( most recent) Lipid Panel     Component Value Date/Time   CHOL 146 02/05/2021 0000   TRIG 285 (A) 02/05/2021 0000   HDL 29 (A) 02/05/2021 0000   CHOLHDL 2.0 02/07/2010 0432   VLDL 9 02/07/2010 0432   LDLCALC 98 02/05/2021 0000      Lab Results  Component Value Date   TSH 0.727 10/11/2017           Assessment & Plan:   1. Type 2 diabetes mellitus with other specified complication, with long-term current use of insulin (HCC)  - Mariah [name] has currently uncontrolled symptomatic type 2 DM since  61 years of age. She presents with her CGM device.  Her AGP report shows 44% time range, 56% slightly above range.  She has no hypoglycemia.  Her recent A1c was 8%.    Recent labs reviewed. - I had a long discussion with her about the progressive nature of diabetes and the pathology behind its complications. -her diabetes is complicated by obesity/sedentary life, polypharmacy, obstructive sleep apnea, comorbid conditions of hyperlipidemia, hypertension and she remains at a high risk for more acute and chronic complications which include CAD, CVA, CKD, retinopathy, and neuropathy. These are all discussed in detail  with her.  - I discussed all available options of managing her diabetes including de-escalation of medications. I have counseled her on diet  and weight management  by adopting a Whole Food , Plant Predominant  ( WFPP) nutrition as recommended by [employer] of Lifestyle Medicine. Patient is encouraged to switch to  unprocessed or minimally processed  complex starch, adequate protein intake (mainly plant source), minimal liquid fat , no Solid fat,  plenty of fruits, and vegetables. -  she is advised to stick to a routine mealtimes to eat 3 complete meals a day and snack only when necessary ( to snack only to correct hypoglycemia BG <70 day time or <100 at night).    - she acknowledges  that there is a room for improvement in her food and drink choices. - Suggestion is made for her to avoid simple carbohydrates  from her diet including Cakes, Sweet Desserts, Ice Cream, Soda (diet and regular), Sweet Tea, Candies, Chips, Cookies, Store Bought Juices, Alcohol , Artificial Sweeteners,  Coffee Creamer, and "Sugar-free" Products, Lemonade. This will help patient to have more stable blood glucose profile and potentially avoid unintended weight gain.  The following Lifestyle Medicine recommendations according to [place]  [facility]) were discussed and and offered to patient and she  agrees to start the journey:  A. Whole Foods, Plant-Based Nutrition comprising of fruits and vegetables, plant-based proteins, whole-grain carbohydrates was discussed in detail with the patient.   A list for source of those nutrients were also provided to the patient.  Patient will use only water or unsweetened tea for hydration. B.  The need to stay away from risky substances including alcohol, smoking; obtaining 7 to 9 hours of restorative sleep, at least 150 minutes of moderate intensity exercise weekly, the importance of healthy social connections,  and stress management techniques were discussed. C.  A full color page of  Calorie density of various food groups per pound showing examples of each food groups was provided to the patient.   - she will be scheduled with Jearld Fenton, RDN, CDE for individualized diabetes education.  - I have approached her with the following plan to manage  her diabetes and patient agrees:   - she will continue to benefit from de-escalation of medications.  For now, she will need slightly higher dose of insulin.  I discussed and increase her Humalog 75/25 to 60 units with breakfast and 60 units with supper,  only when Premeal blood glucose readings are above 90 mg per DL, associated with continuous monitoring of blood glucose using her CGM.   -Because  her engagement to lifestyle medicine is suboptimal.  - she is warned not to take insulin without proper monitoring per orders. - Adjustment parameters are given to her for hypo and hyperglycemia in writing. - she is encouraged to call clinic for blood glucose levels less than 70 or above 200 mg /dl. - she is advised to continue Synjardy to 12.05/998 mg XR p.o. daily at breakfast .    She is advised to continue  Mounjaro 5 mg subcutaneously weekly.  I discussed with the patient a plan to use this medication only for short-term.   - Specific targets for  A1c;  LDL, HDL,  and Triglycerides were discussed with the patient.  2) Blood Pressure /Hypertension:   Her blood pressure is controlled to target.   she is advised to continue her current medications including lisinopril 20 mg p.o. daily with breakfast .  3) Lipids/Hyperlipidemia:   Review of her recent lipid panel showed un controlled  LDL at 98 .  she  is advised to continue    pravastatin 40 mg daily at bedtime.  Side effects and precautions discussed with her.  Multiple plant-based diet discussed and recommended above will help with dyslipidemia as well.   4)  Weight/Diet:  Body mass index is 34.84 kg/m.  -   clearly complicating her diabetes care.   she is  a candidate for weight loss. I discussed with her the fact that loss of 5 - 10% of her  current body weight will have the most impact on her diabetes management.  The above detailed  ACLM recommendations for nutrition, exercise, sleep, social life, avoidance of risky substances, the need for restorative sleep   information will also detailed on discharge instructions.   5) abnormal liver ultrasound with history of nonalcoholic fatty liver disease 2 ill-defined areas of lesions in the liver, favored for fatty sparing.  However, since she has established GI care, I asked her to make a phone call and see her GI specialist for better explanation and further evaluation. Options are to repeat  ultrasound in 6 months for possible MRI for better characterization of the identified lesions in the liver. She has hypoalbuminemia, elevated PT, will need a better assessment of her liver function and anatomy.    6) Chronic Care/Health Maintenance:  -she  is on ACEI/ARB and Statin medications and  is encouraged to initiate and continue to follow up with Ophthalmology, Dentist,  Podiatrist at least yearly or according to recommendations, and advised to   stay away from smoking. I have recommended yearly flu vaccine and pneumonia vaccine at least every 5 years; moderate intensity exercise for up to 150 minutes weekly; and  sleep for 7- 9 hours a day.   - she is  advised to maintain close follow up with Maury Dus, MD for primary care needs, as well as her other providers for optimal and coordinated care.  I spent 44 minutes in the care of the patient today including review of labs from [place], Lipids, Thyroid Function, Hematology (current and previous including abstractions from other facilities); face-to-face time discussing  her blood glucose readings/logs, discussing hypoglycemia and hyperglycemia episodes and symptoms, medications doses, her options of short and long term treatment based on the latest standards of care / guidelines;  discussion about incorporating lifestyle medicine;  and documenting the encounter. Risk reduction counseling performed per USPSTF guidelines to reduce  obesity and cardiovascular risk factors.     Please refer to Patient Instructions for Blood Glucose Monitoring and Insulin/Medications Dosing Guide"  in media tab for additional information. Please  also refer to " Patient Self Inventory" in the Media  tab for reviewed elements of pertinent patient history.  Mariah [name] participated in the discussions, expressed understanding, and voiced agreement with the above plans.  All questions were answered to her satisfaction. she is encouraged to contact clinic  should she have any questions or concerns prior to her return visit.   Follow up plan: - Return in about 3 months (around 11/26/2021) for F/U with Pre-visit Labs, Meter/CGM/Logs, A1c here.  Glade Lloyd, MD [facility] [address] [place], [place] [address] Phone: [phone]  Fax: [phone]    08/26/2021, 10:16 AM  This note was partially dictated with voice recognition software. Similar sounding words can be transcribed inadequately or may not  be corrected upon review.

## 2021-09-08 ENCOUNTER — Other Ambulatory Visit (HOSPITAL_COMMUNITY): Payer: Self-pay

## 2021-09-14 DIAGNOSIS — G4733 Obstructive sleep apnea (adult) (pediatric): Secondary | ICD-10-CM | POA: Diagnosis not present

## 2021-09-16 ENCOUNTER — Encounter: Payer: Self-pay | Admitting: "Endocrinology

## 2021-09-16 ENCOUNTER — Encounter: Payer: 59 | Attending: Family Medicine | Admitting: Nutrition

## 2021-09-16 ENCOUNTER — Other Ambulatory Visit (HOSPITAL_COMMUNITY): Payer: Self-pay

## 2021-09-16 VITALS — Ht 60.0 in | Wt 181.2 lb

## 2021-09-16 DIAGNOSIS — E782 Mixed hyperlipidemia: Secondary | ICD-10-CM | POA: Insufficient documentation

## 2021-09-16 DIAGNOSIS — I10 Essential (primary) hypertension: Secondary | ICD-10-CM | POA: Insufficient documentation

## 2021-09-16 DIAGNOSIS — Z6835 Body mass index (BMI) 35.0-35.9, adult: Secondary | ICD-10-CM | POA: Insufficient documentation

## 2021-09-16 DIAGNOSIS — K760 Fatty (change of) liver, not elsewhere classified: Secondary | ICD-10-CM | POA: Insufficient documentation

## 2021-09-16 DIAGNOSIS — E118 Type 2 diabetes mellitus with unspecified complications: Secondary | ICD-10-CM | POA: Insufficient documentation

## 2021-09-16 NOTE — Progress Notes (Signed)
Medical Nutrition Therapy e[employer]OYEE ID [id number]  Appointment Start time:  0800  Appointment End time:  0900  Primary concerns today: DM   Referral diagnosis: E11.8 Preferred learning style: NO preference  Learning readiness: Changes in progress    NUTRITION ASSESSMENT  61 yr old female here with her husband. Has Type 2 DM x 10 yrs. Is currently on Humalog  75/25 60 units BID. 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 BS are inconsistent. She tends to snack a lot, especially at night. She notes she sometimes skips breakfast. Eats a larger dinner meal.  Snacks on pretzels and cheese and crackers a lot. Had some low blood sugars of  69 last week.  She is hesitant about making lifestyle changes with her food choices and exercise. But she is willing to try. She notes it will be hard for her to give up her sodas, pretzels, cheese and not eat late at night.  Current diet is excessive in CHO, contributing to her elevated and inconsistent blood sugars.  Libre shows TIR 58%, 15% TAR 15%. 1% LOW Estimated A1C 7.6% for the last 2 weeks.    Anthropometrics  Wt Readings from Last 3 Encounters:  08/26/21 178 lb 6.4 oz (80.9 kg)  08/12/21 182 lb 12.8 oz (82.9 kg)  08/11/21 182 lb 9.6 oz (82.8 kg)   Ht Readings from Last 3 Encounters:  08/26/21 5' (1.524 m)  08/12/21 5' (1.524 m)  08/11/21 5' (1.524 m)   There is no height or weight on file to calculate BMI. @BMIFA @ Facility age limit for growth %iles is 20 years. Facility age limit for growth %iles is 20 years.    Clinical Medical Hx: See chart Medications: 75/25 insulin 60 units BID. Labs:  Lab Results  Component Value Date   HGBA1C 9 08/12/2021   HGBA1C 8 08/12/2021      Latest Ref Rng & Units 04/30/2021    8:55 AM 07/31/2010    4:30 AM 07/29/2010    7:46 AM  CMP  Glucose 70 - 99 mg/dL 145     BUN 6 - 20 mg/dL 16     Creatinine 0.44 - 1.00 mg/dL 0.99  0.92  1.08   Sodium 135 - 145 mmol/L 134     Potassium 3.5 - 5.1 mmol/L 3.7  4.0  4.5   Chloride 98 -  111 mmol/L 103     CO2 22 - 32 mmol/L 25     Calcium 8.9 - 10.3 mg/dL 9.1       Notable Signs/Symptoms: Increased thirst, Frequent urination. Craves sweet and salty foods.  Lifestyle & Dietary Hx LIves with her husband, Works for [employer]  Estimated daily fluid intake: 90 oz Supplements: Vit D 3 Sleep:  Stress / self-care:  Current average weekly physical activity: ADL   24-Hr Dietary Recall First Meal: Coffee creamer, 2 cups yogurt yoplay with fruit Snack: Pretzels rods or cheese and crackers Second Meal: tunafish 1/2 c,bacon bits, 8 oz coke Snack: pretzel rods, 5 halves Third Meal: skipped Snack: pretzel rods, 10 grapes Beverages: water  Estimated Energy Needs Calories: 1200 Carbohydrate: 135g Protein: 90g Fat: 33g   NUTRITION DIAGNOSIS  NB-1.1 Food and nutrition-related knowledge deficit As related to Diabetes Type 2.  As evidenced by A1C 8%.   NUTRITION INTERVENTION  Nutrition education (E-1) on the following topics:  Nutrition and Diabetes education provided on My Plate, CHO counting, meal planning, portion sizes, timing of meals, avoiding snacks between meals unless having a low blood sugar, target ranges  for A1C and blood sugars, signs/symptoms and treatment of hyper/hypoglycemia, monitoring blood sugars, taking medications as prescribed, benefits of exercising 30 minutes per day and prevention of complications of DM. Lifestyle Medicine  - Whole Food, Plant Predominant Nutrition is highly recommended: Eat Plenty of vegetables, Mushrooms, fruits, Legumes, Whole Grains, Nuts, seeds in lieu of processed meats, processed snacks/pastries red meat, poultry, eggs.    -It is better to avoid simple carbohydrates including: Cakes, Sweet Desserts, Ice Cream, Soda (diet and regular), Sweet Tea, Candies, Chips, Cookies, Store Bought Juices, Alcohol in Excess of  1-2 drinks a day, Lemonade,  Artificial Sweeteners, Doughnuts, Coffee Creamers, "Sugar-free" Products, etc, etc.  This  is not a complete list.....  Exercise: If you are able: 30 -60 minutes a day ,4 days a week, or 150 minutes a week.  The longer the better.  Combine stretch, strength, and aerobic activities.  If you were told in the past that you have high risk for cardiovascular diseases, you may seek evaluation by your heart doctor prior to initiating moderate to intense exercise programs.   Handouts Provided Include  Lifestyle Medicine  Learning Style & Readiness for Change Teaching method utilized: Visual & Auditory  Demonstrated degree of understanding via: Teach Back  Barriers to learning/adherence to lifestyle change: willingness to change  Goals Established by Pt Goals  Walk 15 minutes once a week. Cut out sodas and only drink water Eat meals on time  B6-8, L12-2 D)5-7 A1C down to 7%   MONITORING & EVALUATION Dietary intake, weekly physical activity, and blood sugars in 1 month.  Next Steps  Patient is to work on meal planning and cut out snacks.Marland Kitchen

## 2021-09-16 NOTE — Patient Instructions (Signed)
Goals  Walk 15 minutes once a week. Cut out sodas and only drink water Eat meals on time  B6-8, L12-2 D)5-7 A1C down to 7%

## 2021-09-17 ENCOUNTER — Other Ambulatory Visit (HOSPITAL_COMMUNITY): Payer: Self-pay

## 2021-09-19 ENCOUNTER — Other Ambulatory Visit (HOSPITAL_COMMUNITY): Payer: Self-pay

## 2021-09-22 ENCOUNTER — Other Ambulatory Visit (HOSPITAL_COMMUNITY): Payer: Self-pay

## 2021-09-22 DIAGNOSIS — L304 Erythema intertrigo: Secondary | ICD-10-CM | POA: Diagnosis not present

## 2021-09-22 MED ORDER — KETOCONAZOLE 2 % EX CREA
TOPICAL_CREAM | CUTANEOUS | 2 refills | Status: DC
  Filled 2021-09-22: qty 60, 30d supply, fill #0

## 2021-09-22 MED ORDER — HYDROCORTISONE 2.5 % EX CREA
TOPICAL_CREAM | CUTANEOUS | 2 refills | Status: DC
  Filled 2021-09-22 – 2021-09-23 (×2): qty 30, 5d supply, fill #0

## 2021-09-23 ENCOUNTER — Other Ambulatory Visit (HOSPITAL_COMMUNITY): Payer: Self-pay

## 2021-09-28 ENCOUNTER — Other Ambulatory Visit (HOSPITAL_COMMUNITY): Payer: Self-pay

## 2021-09-28 DIAGNOSIS — E782 Mixed hyperlipidemia: Secondary | ICD-10-CM | POA: Diagnosis not present

## 2021-09-28 DIAGNOSIS — J309 Allergic rhinitis, unspecified: Secondary | ICD-10-CM | POA: Diagnosis not present

## 2021-09-28 DIAGNOSIS — I10 Essential (primary) hypertension: Secondary | ICD-10-CM | POA: Diagnosis not present

## 2021-09-28 DIAGNOSIS — F419 Anxiety disorder, unspecified: Secondary | ICD-10-CM | POA: Diagnosis not present

## 2021-09-28 DIAGNOSIS — E1165 Type 2 diabetes mellitus with hyperglycemia: Secondary | ICD-10-CM | POA: Diagnosis not present

## 2021-09-28 DIAGNOSIS — J4520 Mild intermittent asthma, uncomplicated: Secondary | ICD-10-CM | POA: Diagnosis not present

## 2021-09-28 DIAGNOSIS — K219 Gastro-esophageal reflux disease without esophagitis: Secondary | ICD-10-CM | POA: Diagnosis not present

## 2021-09-28 DIAGNOSIS — G4700 Insomnia, unspecified: Secondary | ICD-10-CM | POA: Diagnosis not present

## 2021-09-28 DIAGNOSIS — Z111 Encounter for screening for respiratory tuberculosis: Secondary | ICD-10-CM | POA: Diagnosis not present

## 2021-09-28 DIAGNOSIS — F900 Attention-deficit hyperactivity disorder, predominantly inattentive type: Secondary | ICD-10-CM | POA: Diagnosis not present

## 2021-09-28 MED ORDER — LISINOPRIL 20 MG PO TABS: 20.0000 mg | ORAL_TABLET | ORAL | 1 refills | Status: DC

## 2021-09-28 MED ORDER — GLYCOPYRROLATE 1 MG PO TABS
1.0000 mg | ORAL_TABLET | Freq: Two times a day (BID) | ORAL | 1 refills | Status: DC
  Filled 2021-11-12: qty 180, 90d supply, fill #0
  Filled 2021-12-25 – 2022-03-26 (×3): qty 180, 90d supply, fill #1

## 2021-09-28 MED ORDER — DULOXETINE HCL 60 MG PO CPEP
60.0000 mg | ORAL_CAPSULE | Freq: Every day | ORAL | 1 refills | Status: DC
  Filled 2021-10-09 – 2021-12-25 (×2): qty 90, 90d supply, fill #0
  Filled 2022-03-26: qty 90, 90d supply, fill #1

## 2021-09-29 ENCOUNTER — Other Ambulatory Visit (HOSPITAL_COMMUNITY): Payer: Self-pay

## 2021-09-29 MED ORDER — METHYLPHENIDATE HCL ER (OSM) 27 MG PO TBCR
27.0000 mg | EXTENDED_RELEASE_TABLET | ORAL | 0 refills | Status: DC
  Filled 2021-11-12: qty 90, 90d supply, fill #0

## 2021-09-30 ENCOUNTER — Other Ambulatory Visit (HOSPITAL_COMMUNITY): Payer: Self-pay

## 2021-09-30 ENCOUNTER — Other Ambulatory Visit: Payer: Self-pay

## 2021-09-30 DIAGNOSIS — Z794 Long term (current) use of insulin: Secondary | ICD-10-CM

## 2021-09-30 MED ORDER — INSULIN PEN NEEDLE 31G X 5 MM MISC
2 refills | Status: AC
  Filled 2021-09-30: qty 100, 90d supply, fill #0
  Filled 2021-10-09: qty 200, 66d supply, fill #0
  Filled 2022-04-29 – 2022-05-08 (×2): qty 200, 66d supply, fill #1

## 2021-10-01 ENCOUNTER — Other Ambulatory Visit (HOSPITAL_COMMUNITY): Payer: Self-pay

## 2021-10-02 ENCOUNTER — Encounter: Payer: Self-pay | Admitting: "Endocrinology

## 2021-10-05 ENCOUNTER — Other Ambulatory Visit: Payer: Self-pay | Admitting: "Endocrinology

## 2021-10-09 ENCOUNTER — Other Ambulatory Visit (HOSPITAL_COMMUNITY): Payer: Self-pay

## 2021-10-16 ENCOUNTER — Other Ambulatory Visit: Payer: Self-pay | Admitting: "Endocrinology

## 2021-10-16 ENCOUNTER — Other Ambulatory Visit (HOSPITAL_COMMUNITY): Payer: Self-pay

## 2021-10-19 ENCOUNTER — Other Ambulatory Visit (HOSPITAL_COMMUNITY): Payer: Self-pay

## 2021-10-19 MED FILL — Empagliflozin-Metformin HCl Tab ER 24HR 12.5-1000 MG: ORAL | 90 days supply | Qty: 180 | Fill #0 | Status: AC

## 2021-10-20 ENCOUNTER — Other Ambulatory Visit (HOSPITAL_COMMUNITY): Payer: Self-pay

## 2021-10-20 ENCOUNTER — Encounter: Payer: Self-pay | Admitting: Allergy and Immunology

## 2021-10-20 ENCOUNTER — Ambulatory Visit (INDEPENDENT_AMBULATORY_CARE_PROVIDER_SITE_OTHER): Payer: 59 | Admitting: Allergy and Immunology

## 2021-10-20 VITALS — BP 126/86 | HR 100 | Temp 97.9°F | Resp 18 | Ht 60.0 in | Wt 182.4 lb

## 2021-10-20 DIAGNOSIS — J3089 Other allergic rhinitis: Secondary | ICD-10-CM | POA: Diagnosis not present

## 2021-10-20 DIAGNOSIS — H1013 Acute atopic conjunctivitis, bilateral: Secondary | ICD-10-CM | POA: Diagnosis not present

## 2021-10-20 DIAGNOSIS — J4520 Mild intermittent asthma, uncomplicated: Secondary | ICD-10-CM | POA: Diagnosis not present

## 2021-10-20 DIAGNOSIS — J301 Allergic rhinitis due to pollen: Secondary | ICD-10-CM

## 2021-10-20 DIAGNOSIS — H04123 Dry eye syndrome of bilateral lacrimal glands: Secondary | ICD-10-CM

## 2021-10-20 DIAGNOSIS — T781XXD Other adverse food reactions, not elsewhere classified, subsequent encounter: Secondary | ICD-10-CM

## 2021-10-20 DIAGNOSIS — H1010 Acute atopic conjunctivitis, unspecified eye: Secondary | ICD-10-CM

## 2021-10-20 MED ORDER — ALBUTEROL SULFATE HFA 108 (90 BASE) MCG/ACT IN AERS
1.0000 | INHALATION_SPRAY | Freq: Four times a day (QID) | RESPIRATORY_TRACT | 1 refills | Status: AC | PRN
  Filled 2021-10-20: qty 6.7, 25d supply, fill #0

## 2021-10-20 MED ORDER — ADVAIR DISKUS 500-50 MCG/ACT IN AEPB
1.0000 | INHALATION_SPRAY | Freq: Two times a day (BID) | RESPIRATORY_TRACT | 5 refills | Status: AC
  Filled 2021-10-20 – 2022-03-29 (×3): qty 60, 30d supply, fill #0

## 2021-10-20 MED ORDER — AZELASTINE HCL 0.1 % NA SOLN
NASAL | 5 refills | Status: AC
  Filled 2021-10-20: qty 30, 30d supply, fill #0

## 2021-10-20 MED ORDER — CETIRIZINE HCL 10 MG PO TABS
10.0000 mg | ORAL_TABLET | Freq: Every day | ORAL | 5 refills | Status: AC
  Filled 2021-10-20 – 2021-12-25 (×2): qty 30, 30d supply, fill #0
  Filled 2022-02-22 – 2022-07-21 (×3): qty 30, 30d supply, fill #1
  Filled 2022-08-28: qty 30, 30d supply, fill #2
  Filled 2022-10-13: qty 30, 30d supply, fill #3

## 2021-10-20 NOTE — Progress Notes (Unsigned)
[place]   Follow-up Note  Referring Provider: Maury Dus, MD Primary Provider: Maury Dus, MD Date of Office Visit: 10/20/2021  Subjective:   Mariah [name] (DOB: 10/27/1960) is a 61 y.o. female who returns to the [facility] on 10/20/2021 in re-evaluation of the following:  HPI: Mariah [name] returns to this clinic in reevaluation of asthma, allergic rhinoconjunctivitis, adverse food reaction to mushroom consumption.  I last saw her in this clinic on 30 December 2020.  She has had a wonderful interval of time since have last seen her in this clinic without any significant problems involving either her upper or lower airway and her requirement for albuterol is basically 0 and she can exert herself to the extent that she so desires without any problem.  She does not require any nasal steroid at this point in time.  Occasionally she uses cetirizine with the understanding that it may cause some dry eye issues.  She has not had to activate her "action plan" for asthma flare and she has not required a systemic steroid or antibiotic for any type of airway issue.  She does not consume mushrooms.  Allergies as of 10/20/2021       Reactions   Codeine Hives, Swelling   Flonase [fluticasone]    Nose bleeds    Other Other (See Comments)   mushrooms        Medication List    Advair Diskus 500-50 MCG/ACT Aepb Generic drug: fluticasone-salmeterol Inhale 1 puff into the lungs in the morning and at bedtime.   albuterol 108 (90 Base) MCG/ACT inhaler Commonly known as: Ventolin HFA Inhale 1 - 2 puffs into the lungs every 6 hours as needed for wheezing or shortness of breath.   aspirin EC 81 MG tablet Take 81 mg by mouth daily.   aspirin-acetaminophen-caffeine 250-250-65 MG tablet Commonly known as: EXCEDRIN MIGRAINE Take 1 tablet by mouth every 6 (six) hours as needed for headache.   Azelastine HCl 137 MCG/SPRAY  Soln Place 1 spray into each nostril 1 - 2 times daily.   Biotin 2500 MCG Caps Take 2,500 mcg by mouth at bedtime.   brimonidine 0.1 % Soln Commonly known as: ALPHAGAN P Place 1 drop into both eyes 2 (two) times daily.   cetirizine 10 MG tablet Commonly known as: ZYRTEC Take 10 mg by mouth at bedtime.   cholecalciferol 25 MCG (1000 UNIT) tablet Commonly known as: VITAMIN D3 Take 1,000 Units by mouth at bedtime.   Concerta 27 MG CR tablet Generic drug: methylphenidate Take 1 tablet by mouth every morning   Concerta 27 MG CR tablet Generic drug: methylphenidate Take 1 tablet by mouth every morning   Concerta 27 MG CR tablet Generic drug: methylphenidate Take 1 tablet by mouth every morning   Concerta 27 MG CR tablet Generic drug: methylphenidate Take 1 tablet (27 mg total) by mouth every morning   methylphenidate 27 MG CR tablet Commonly known as: Concerta Take 1 tablet (27 mg total) by mouth every morning. Start taking on: November 11, 2021   DULoxetine 60 MG capsule Commonly known as: CYMBALTA Take 1 capsule by mouth once daily What changed:  how much to take when to take this   DULoxetine 60 MG capsule Commonly known as: CYMBALTA Take 1 capsule (60 mg total) by mouth daily. What changed: Another medication with the same name was changed. Make sure you understand how and when to take each.  EPINEPHrine 0.3 mg/0.3 mL Soaj injection Commonly known as: EPI-PEN Inject 0.3 mg into the muscle as needed for anaphylaxis.   FreeStyle Libre 3 Sensor Misc Inject 1 sensor to the skin every 14 days for continuous glucose monitoring as directed.   Glucagon Emergency 1 MG Kit Use as directed.   glycopyrrolate 1 MG tablet Commonly known as: ROBINUL Take 1 tablet (1 mg total) by mouth 2 (two) times daily.   HumaLOG Mix 75/25 KwikPen (75-25) 100 UNIT/ML Kwikpen Generic drug: Insulin Lispro Prot & Lispro Inject 60 Units into the skin 2 (two) times daily before a  meal.   hydrocortisone 2.5 % cream Apply 1 liberally to affected area once a day Can use in the Am or PM   Insulin Pen Needle 31G X 5 MM Misc Use to inject insulin   ketoconazole 2 % cream Commonly known as: NIZORAL Apply cream to affected area once a day. Can use in the morning or evening   lisinopril 20 MG tablet Commonly known as: ZESTRIL Take 1 tablet by mouth every morning   lisinopril 20 MG tablet Commonly known as: ZESTRIL Take 1 tablet (20 mg total) by mouth every morning.   Mounjaro 5 MG/0.5ML Pen Generic drug: tirzepatide Inject 5 mg into the skin once a week. To start on 05/03/21.   Mounjaro 5 MG/0.5ML Pen Generic drug: tirzepatide Inject 5 mg (0.5 ml) into the skin every 7 days.   OCUVITE PO Take 1 tablet by mouth daily.   olopatadine 0.1 % ophthalmic solution Commonly known as: PATANOL Place 1 drop into both eyes 2 times daily.   pantoprazole 40 MG tablet Commonly known as: PROTONIX Take 1 tablet by mouth every morning   Pfizer COVID-19 Vac Bivalent injection Generic drug: COVID-19 mRNA bivalent vaccine (Pfizer) Inject into the muscle.   pravastatin 40 MG tablet Commonly known as: PRAVACHOL Take 1 tablet by mouth at bedtime   PROBIOTIC DAILY PO Take 1 capsule by mouth at bedtime.   Synjardy XR 12.05-998 MG Tb24 Generic drug: Empagliflozin-metFORMIN HCl ER Take 1 tablet by mouth 2 (two) times daily with a meal. Stop separate Jardiance and Metformin.   Systane 0.4-0.3 % Soln Generic drug: Polyethyl Glycol-Propyl Glycol Apply 1 drop to eye as needed.   traMADol 50 MG tablet Commonly known as: ULTRAM Take 1-2 tablets (50-100 mg total) by mouth every 6 (six) hours as needed for moderate pain or severe pain.   triamcinolone cream 0.1 % Commonly known as: KENALOG Apply externally twice a day as needed   vitamin B-12 500 MCG tablet Commonly known as: CYANOCOBALAMIN Take 500 mcg by mouth at bedtime.   VITAMIN B-3 PO Take 1 tablet by mouth  daily.   vitamin C 250 MG tablet Commonly known as: ASCORBIC ACID Take 250 mg by mouth at bedtime.   zolpidem 10 MG tablet Commonly known as: AMBIEN Take 1/2-1 tablet by mouth at bedtime, as needed (30 days)    Past Medical History:  Diagnosis Date   Allergy    Anemia    Anxiety    Arthritis    Asthma    Body odor    from colostomy takedown   Cervicalgia    Depression    Diabetes mellitus without complication (HCC)    Fibroid    GERD (gastroesophageal reflux disease)    Glaucoma    Headache    Heart murmur    as a child   Hyperlipidemia    Hypertension    Hypothyroidism  Infertility    OSA (obstructive sleep apnea)    upper airway resistance syndrome on CPAP at 9cm H2O   Reflux    Vertigo     Past Surgical History:  Procedure Laterality Date   COLECTOMY WITH COLOSTOMY CREATION/HARTMANN PROCEDURE  02/07/2010   Perforated   COLOSTOMY TAKEDOWN  07/28/2010   Lap colostomy takedown   LAPAROTOMY N/A 05/07/2021   Procedure: ABDOMINAL WALL EXPLORATION, PROBABLE REMOVAL OF FOREIGN BODY;  Surgeon: Michael Boston, MD;  Location: [facility];  Service: General;  Laterality: N/A;   TONSILLECTOMY     VENTRAL HERNIA REPAIR  02/07/2010   Primary repair of old hernia    Review of systems negative except as noted in HPI / PMHx or noted below:  Review of Systems  Constitutional: Negative.   HENT: Negative.    Eyes: Negative.   Respiratory: Negative.    Cardiovascular: Negative.   Gastrointestinal: Negative.   Genitourinary: Negative.   Musculoskeletal: Negative.   Skin: Negative.   Neurological: Negative.   Endo/Heme/Allergies: Negative.   Psychiatric/Behavioral: Negative.       Objective:   Vitals:   10/20/21 0852  BP: 126/86  Pulse: 100  Resp: 18  Temp: 97.9 F (36.6 C)  SpO2: 100%   Height: 5' (152.4 cm)  Weight: 182 lb 6.4 oz (82.7 kg)   Physical Exam Constitutional:      Appearance: She is not diaphoretic.  HENT:     Head: Normocephalic.     Right  Ear: Tympanic membrane, ear canal and external ear normal.     Left Ear: Tympanic membrane, ear canal and external ear normal.     Nose: Nose normal. No mucosal edema or rhinorrhea.     Mouth/Throat:     Pharynx: Uvula midline. No oropharyngeal exudate.  Eyes:     Conjunctiva/sclera: Conjunctivae normal.  Neck:     Thyroid: No thyromegaly.     Trachea: Trachea normal. No tracheal tenderness or tracheal deviation.  Cardiovascular:     Rate and Rhythm: Normal rate and regular rhythm.     Heart sounds: Normal heart sounds, S1 normal and S2 normal. No murmur heard. Pulmonary:     Effort: No respiratory distress.     Breath sounds: Normal breath sounds. No stridor. No wheezing or rales.  Lymphadenopathy:     Head:     Right side of head: No tonsillar adenopathy.     Left side of head: No tonsillar adenopathy.     Cervical: No cervical adenopathy.  Skin:    Findings: No erythema or rash.     Nails: There is no clubbing.  Neurological:     Mental Status: She is alert.     Diagnostics:    Spirometry was performed and demonstrated an FEV1 of 1.08 at 62 % of predicted.  The patient had an Asthma Control Test with the following results: ACT Total Score: 23.    Assessment and Plan:   1. Asthma, mild intermittent, well-controlled   2. Perennial allergic rhinitis   3. Seasonal allergic rhinitis due to pollen   4. Seasonal allergic conjunctivitis   5. Dry eye syndrome of both eyes   6. Adverse food reaction, subsequent encounter    1.  Allergen avoidance measures - pollen, mold  2.  If needed:  A.  Albuterol HFA -2 inhalations every 4-6 hours B.  Pataday -1 drop each eye 1 time per day C.  Systane eyedrops multiple times per day D.  Azelastine - 1 spray each  nostril 1-2 times per day E.  OTC Nasacort/Rhinocort - 1 spray each nostril 1-7 times per week F.  Cetirizine 10 mg - 1 tablet 1 time per day (dry eyes???) G. Epi-pen  3. "Action Plan" for asthma flare:  A. Start Advair  500 - 1 inhalation 2 times per day B. Use Albuterol HFA if needed  4.  Obtain flu vaccine, RSV vaccine, COVID-vaccine  5. Return to clinic in 12 months or earlier if problem  Mariah [name] appears to be doing quite well with minimal amount of medication to utilize on an as-needed basis.  Hopefully she will continue to do well as she goes through this upcoming year.  We have refilled her medications and we are here to see her should she develop a problem with the plan noted above as she goes through this next year.  If she does well I will see her back in this clinic in 1 year.  Allena Katz, MD Allergy / Immunology [place]

## 2021-10-20 NOTE — Patient Instructions (Addendum)
  1.  Allergen avoidance measures - pollen, mold  2.  If needed:  A.  Albuterol HFA -2 inhalations every 4-6 hours B.  Pataday -1 drop each eye 1 time per day C.  Systane eyedrops multiple times per day D.  Azelastine - 1 spray each nostril 1-2 times per day E.  OTC Nasacort/Rhinocort - 1 spray each nostril 1-7 times per week F.  Cetirizine 10 mg - 1 tablet 1 time per day (dry eyes???) G. Epi-pen  3. "Action Plan" for asthma flare:  A. Start Advair 500 - 1 inhalation 2 times per day B. Use Albuterol HFA if needed  4.  Obtain flu vaccine, RSV vaccine, COVID-vaccine  5. Return to clinic in 12 months or earlier if problem

## 2021-10-21 ENCOUNTER — Encounter: Payer: Self-pay | Admitting: Allergy and Immunology

## 2021-10-30 ENCOUNTER — Other Ambulatory Visit (HOSPITAL_COMMUNITY): Payer: Self-pay

## 2021-10-31 ENCOUNTER — Other Ambulatory Visit (HOSPITAL_COMMUNITY): Payer: Self-pay

## 2021-11-12 ENCOUNTER — Encounter: Payer: Self-pay | Admitting: "Endocrinology

## 2021-11-12 ENCOUNTER — Other Ambulatory Visit (HOSPITAL_COMMUNITY): Payer: Self-pay

## 2021-11-16 ENCOUNTER — Other Ambulatory Visit (HOSPITAL_COMMUNITY): Payer: Self-pay

## 2021-11-25 ENCOUNTER — Ambulatory Visit: Payer: 59 | Admitting: "Endocrinology

## 2021-11-25 ENCOUNTER — Encounter: Payer: Self-pay | Admitting: "Endocrinology

## 2021-11-25 ENCOUNTER — Other Ambulatory Visit (HOSPITAL_COMMUNITY): Payer: Self-pay

## 2021-11-25 ENCOUNTER — Encounter: Payer: 59 | Attending: Family Medicine | Admitting: Nutrition

## 2021-11-25 VITALS — Ht 60.0 in | Wt 180.0 lb

## 2021-11-25 VITALS — BP 108/72 | HR 88 | Ht 60.0 in | Wt 180.2 lb

## 2021-11-25 DIAGNOSIS — E118 Type 2 diabetes mellitus with unspecified complications: Secondary | ICD-10-CM | POA: Insufficient documentation

## 2021-11-25 DIAGNOSIS — E782 Mixed hyperlipidemia: Secondary | ICD-10-CM | POA: Insufficient documentation

## 2021-11-25 DIAGNOSIS — Z794 Long term (current) use of insulin: Secondary | ICD-10-CM

## 2021-11-25 DIAGNOSIS — K760 Fatty (change of) liver, not elsewhere classified: Secondary | ICD-10-CM

## 2021-11-25 DIAGNOSIS — Z6835 Body mass index (BMI) 35.0-35.9, adult: Secondary | ICD-10-CM | POA: Insufficient documentation

## 2021-11-25 DIAGNOSIS — I10 Essential (primary) hypertension: Secondary | ICD-10-CM

## 2021-11-25 DIAGNOSIS — E1169 Type 2 diabetes mellitus with other specified complication: Secondary | ICD-10-CM | POA: Diagnosis not present

## 2021-11-25 LAB — POCT GLYCOSYLATED HEMOGLOBIN (HGB A1C): HbA1c, POC (controlled diabetic range): 10.6 % — AB (ref 0.0–7.0)

## 2021-11-25 MED ORDER — HUMALOG MIX 75/25 KWIKPEN (75-25) 100 UNIT/ML ~~LOC~~ SUPN: 70.0000 [IU] | PEN_INJECTOR | Freq: Two times a day (BID) | SUBCUTANEOUS | 2 refills | Status: DC

## 2021-11-25 MED ORDER — TIRZEPATIDE 7.5 MG/0.5ML ~~LOC~~ SOAJ
7.5000 mg | SUBCUTANEOUS | 1 refills | Status: DC
  Filled 2021-11-25: qty 2, 28d supply, fill #0

## 2021-11-25 NOTE — Patient Instructions (Addendum)
Goal  Start eating breakfast daily- choose fruit Take Synjardy with breakfast Start journaling for stress and food journal Cut out pretzel rods. Get A1C down to  8%.

## 2021-11-25 NOTE — Progress Notes (Addendum)
Medical Nutrition Therapy eMPLOYEE ID [id number]  Appointment Start time:  [phone]  Appointment End time:  11  Primary concerns today: DM   Referral diagnosis: E11.8 Preferred learning style: NO preference  Learning readiness: Changes in progress    NUTRITION ASSESSMENT DM Follow up 61 yr old female here with her husband. Has Type 2 DM x 10 yrs. Is currently on Humalog  75/25 60 units BID. 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   She notes she can't stop eating pretzel rods. Not eating meals on time. Skips breakfast and then grazes for meals.  Current diet is excessive in CHO, contributing to her elevated and inconsistent blood sugars. Diet is high in salt also.  Libre: 28% TIR, 44% TAR, 28% Very high AVG BS 218 mg/dl. A1C estimate is 8.5%. This has improved from her 10.6% A1C a month ago. She lost 2 lbs.  Willing to work harder on changing her food choices.   Anthropometrics  Wt Readings from Last 3 Encounters:  11/25/21 180 lb 3.2 oz (81.7 kg)  10/20/21 182 lb 6.4 oz (82.7 kg)  09/16/21 181 lb 3.2 oz (82.2 kg)   Ht Readings from Last 3 Encounters:  11/25/21 5' (1.524 m)  10/20/21 5' (1.524 m)  09/16/21 5' (1.524 m)   There is no height or weight on file to calculate BMI. @BMIFA @ Facility age limit for growth %iles is 20 years. Facility age limit for growth %iles is 20 years.    Clinical Medical Hx: See chart Medications: 75/25 insulin 60 units BID. Labs:  Lab Results  Component Value Date   HGBA1C 10.6 (A) 11/25/2021      Latest Ref Rng & Units 04/30/2021    8:55 AM 07/31/2010    4:30 AM 07/29/2010    7:46 AM  CMP  Glucose 70 - 99 mg/dL 145     BUN 6 - 20 mg/dL 16     Creatinine 0.44 - 1.00 mg/dL 0.99  0.92  1.08   Sodium 135 - 145 mmol/L 134     Potassium 3.5 - 5.1 mmol/L 3.7  4.0  4.5   Chloride 98 - 111 mmol/L 103     CO2 22 - 32 mmol/L 25     Calcium 8.9 - 10.3 mg/dL 9.1       Notable Signs/Symptoms: Increased thirst, Frequent urination. Craves sweet and salty  foods.  Lifestyle & Dietary Hx LIves with her husband, Works for [employer]  Estimated daily fluid intake: 90 oz Supplements: Vit D 3 Sleep:  Stress / self-care:  Current average weekly physical activity: ADL   24-Hr Dietary Recall First Meal: Coffee creamer, 2 cups yogurt yoplay with fruit Snack: Pretzels rods or cheese and crackers Second Meal: tunafish 1/2 c,bacon bits, 8 oz coke Snack: pretzel rods, 5 halves Third Meal: skipped Snack: pretzel rods, 10 grapes Beverages: water  Estimated Energy Needs Calories: 1200 Carbohydrate: 135g Protein: 90g Fat: 33g   NUTRITION DIAGNOSIS  NB-1.1 Food and nutrition-related knowledge deficit As related to Diabetes Type 2.  As evidenced by A1C 8%.   NUTRITION INTERVENTION  Nutrition education (E-1) on the following topics:  Nutrition and Diabetes education provided on My Plate, CHO counting, meal planning, portion sizes, timing of meals, avoiding snacks between meals unless having a low blood sugar, target ranges for A1C and blood sugars, signs/symptoms and treatment of hyper/hypoglycemia, monitoring blood sugars, taking medications as prescribed, benefits of exercising 30 minutes per day and prevention of complications of DM. Lifestyle Medicine  - Whole Food,  Plant Predominant Nutrition is highly recommended: Eat Plenty of vegetables, Mushrooms, fruits, Legumes, Whole Grains, Nuts, seeds in lieu of processed meats, processed snacks/pastries red meat, poultry, eggs.    -It is better to avoid simple carbohydrates including: Cakes, Sweet Desserts, Ice Cream, Soda (diet and regular), Sweet Tea, Candies, Chips, Cookies, Store Bought Juices, Alcohol in Excess of  1-2 drinks a day, Lemonade,  Artificial Sweeteners, Doughnuts, Coffee Creamers, "Sugar-free" Products, etc, etc.  This is not a complete list.....  Exercise: If you are able: 30 -60 minutes a day ,4 days a week, or 150 minutes a week.  The longer the better.  Combine stretch, strength,  and aerobic activities.  If you were told in the past that you have high risk for cardiovascular diseases, you may seek evaluation by your heart doctor prior to initiating moderate to intense exercise programs.   Handouts Provided Include  Lifestyle Medicine  Learning Style & Readiness for Change Teaching method utilized: Visual & Auditory  Demonstrated degree of understanding via: Teach Back  Barriers to learning/adherence to lifestyle change: willingness to change  Goals Established by Pt Goal  Start eating breakfast daily- choose fruit Take Synjardy with breakfast Start journaling for stress and food journal Cut out pretzel rods. Get A1C down to  8%.   MONITORING & EVALUATION Dietary intake, weekly physical activity, and blood sugars in 1-3  month.  Next Steps  Patient is to work on meal planning and cut out snacks.Marland Kitchen

## 2021-11-25 NOTE — Progress Notes (Signed)
11/25/2021, 12:52 PM  Endocrinology follow-up note   Subjective:    Patient ID: Mariah [name], female    DOB: 1960/12/09.  Mariah [name] is being seen in follow-up after she was seen in consultation for management of currently uncontrolled symptomatic diabetes requested by  Maury Dus, MD.   Past Medical History:  Diagnosis Date   Allergy    Anemia    Anxiety    Arthritis    Asthma    Body odor    from colostomy takedown   Cervicalgia    Depression    Diabetes mellitus without complication (HCC)    Fibroid    GERD (gastroesophageal reflux disease)    Glaucoma    Headache    Heart murmur    as a child   Hyperlipidemia    Hypertension    Hypothyroidism    Infertility    OSA (obstructive sleep apnea)    upper airway resistance syndrome on CPAP at 9cm H2O   Reflux    Vertigo     Past Surgical History:  Procedure Laterality Date   COLECTOMY WITH COLOSTOMY CREATION/HARTMANN PROCEDURE  02/07/2010   Perforated   COLOSTOMY TAKEDOWN  07/28/2010   Lap colostomy takedown   LAPAROTOMY N/A 05/07/2021   Procedure: ABDOMINAL WALL EXPLORATION, PROBABLE REMOVAL OF FOREIGN BODY;  Surgeon: Michael Boston, MD;  Location: [facility];  Service: General;  Laterality: N/A;   TONSILLECTOMY     VENTRAL HERNIA REPAIR  02/07/2010   Primary repair of old hernia    Social History   Socioeconomic History   Marital status: Married    Spouse name: Not on file   Number of children: Not on file   Years of education: Not on file   Highest education level: Not on file  Occupational History   Not on file  Tobacco Use   Smoking status: Former    Types: Cigarettes    Quit date: 11/19/1989    Years since quitting: 32.0   Smokeless tobacco: Never  Vaping Use   Vaping Use: Never used  Substance and Sexual Activity   Alcohol use: Yes    Comment: Social   Drug use: No   Sexual activity: Not  Currently    Partners: Male    Birth control/protection: Post-menopausal    Comment: 1ST intercourse-17, partners- 23, married- 94 yrs   Other Topics Concern   Not on file  Social History Narrative   Not on file   Social Determinants of Health   Financial Resource Strain: Not on file  Food Insecurity: Not on file  Transportation Needs: Not on file  Physical Activity: Not on file  Stress: Not on file  Social Connections: Not on file    Family History  Problem Relation Age of Onset   Diabetes Mother    Hypertension Mother    Hyperlipidemia Mother    Diabetes Father    Hypertension Father    Heart attack Father    Diabetes Maternal Grandmother    Hypertension Maternal Grandmother    Heart disease Maternal Grandmother    Cancer Maternal Grandfather        COLON   Breast cancer Paternal Grandmother  Age 61's    Outpatient Encounter Medications as of 11/25/2021  Medication Sig   lisinopril (ZESTRIL) 10 MG tablet Take 10 mg by mouth daily.   tirzepatide (MOUNJARO) 7.5 MG/0.5ML Pen Inject 7.5 mg into the skin once a week.   ADVAIR DISKUS 500-50 MCG/ACT AEPB Inhale 1 puff into the lungs in the morning and at bedtime.   albuterol (VENTOLIN HFA) 108 (90 Base) MCG/ACT inhaler Inhale 1 - 2 puffs into the lungs every 6 hours as needed for wheezing or shortness of breath.   aspirin 81 MG EC tablet Take 81 mg by mouth daily.   aspirin-acetaminophen-caffeine (EXCEDRIN MIGRAINE) 250-250-65 MG tablet Take 1 tablet by mouth every 6 (six) hours as needed for headache.   azelastine (ASTELIN) 0.1 % nasal spray Place 1 spray into each nostril 1 - 2 times daily.   Biotin 2500 MCG CAPS Take 2,500 mcg by mouth at bedtime.   brimonidine (ALPHAGAN P) 0.1 % SOLN Place 1 drop into both eyes 2 (two) times daily.   cetirizine (ZYRTEC) 10 MG tablet Take 1 tablet (10 mg total) by mouth at bedtime.   cholecalciferol (VITAMIN D3) 25 MCG (1000 UNIT) tablet Take 1,000 Units by mouth at bedtime.    Continuous Blood Gluc Sensor (FREESTYLE LIBRE 3 SENSOR) MISC Inject 1 sensor to the skin every 14 days for continuous glucose monitoring as directed.   COVID-19 mRNA bivalent vaccine, Pfizer, (PFIZER COVID-19 VAC BIVALENT) injection Inject into the muscle.   DULoxetine (CYMBALTA) 60 MG capsule Take 1 capsule by mouth once daily (Patient taking differently: Take 60 mg by mouth at bedtime.)   DULoxetine (CYMBALTA) 60 MG capsule Take 1 capsule (60 mg total) by mouth daily.   Empagliflozin-metFORMIN HCl ER (SYNJARDY XR) 12.05-998 MG TB24 Take 1 tablet by mouth 2 (two) times daily with a meal. Stop separate Jardiance and Metformin. (Patient taking differently: Take 1 tablet by mouth daily.)   EPINEPHrine 0.3 mg/0.3 mL IJ SOAJ injection Inject 0.3 mg into the muscle as needed for anaphylaxis.   Glucagon, rDNA, (GLUCAGON EMERGENCY) 1 MG KIT Use as directed.   glycopyrrolate (ROBINUL) 1 MG tablet Take 1 tablet (1 mg total) by mouth 2 (two) times daily.   HUMALOG MIX 75/25 KWIKPEN (75-25) 100 UNIT/ML KwikPen Inject 70 Units into the skin 2 (two) times daily before a meal.   hydrocortisone 2.5 % cream Apply 1 liberally to affected area once a day Can use in the Am or PM   Insulin Pen Needle 31G X 5 MM MISC Use to inject insulin   ketoconazole (NIZORAL) 2 % cream Apply cream to affected area once a day. Can use in the morning or evening   methylphenidate (CONCERTA) 27 MG PO CR tablet Take 1 tablet by mouth every morning   methylphenidate (CONCERTA) 27 MG PO CR tablet Take 1 tablet (27 mg total) by mouth every morning   methylphenidate (CONCERTA) 27 MG PO CR tablet Take 1 tablet (27 mg total) by mouth every morning.   Multiple Vitamins-Minerals (OCUVITE PO) Take 1 tablet by mouth daily.   Niacin (VITAMIN B-3 PO) Take 1 tablet by mouth daily.   olopatadine (PATANOL) 0.1 % ophthalmic solution Place 1 drop into both eyes 2 times daily.   pantoprazole (PROTONIX) 40 MG tablet Take 1 tablet by mouth every morning    Polyethyl Glycol-Propyl Glycol (SYSTANE) 0.4-0.3 % SOLN Apply 1 drop to eye as needed.   pravastatin (PRAVACHOL) 40 MG tablet Take 1 tablet by mouth at bedtime  Probiotic Product (PROBIOTIC DAILY PO) Take 1 capsule by mouth at bedtime.   traMADol (ULTRAM) 50 MG tablet Take 1-2 tablets (50-100 mg total) by mouth every 6 (six) hours as needed for moderate pain or severe pain.   triamcinolone cream (KENALOG) 0.1 % Apply externally twice a day as needed   vitamin C (ASCORBIC ACID) 250 MG tablet Take 250 mg by mouth at bedtime.   zolpidem (AMBIEN) 10 MG tablet Take 1/2-1 tablet by mouth at bedtime, as needed (30 days)   [DISCONTINUED] HUMALOG MIX 75/25 KWIKPEN (75-25) 100 UNIT/ML KwikPen Inject 60 Units into the skin 2 (two) times daily before a meal.   [DISCONTINUED] lisinopril (ZESTRIL) 20 MG tablet Take 1 tablet by mouth every morning   [DISCONTINUED] lisinopril (ZESTRIL) 20 MG tablet Take 1 tablet (20 mg total) by mouth every morning.   [DISCONTINUED] tirzepatide (MOUNJARO) 5 MG/0.5ML Pen Inject 5 mg (0.5 ml) into the skin every 7 days.   [DISCONTINUED] tirzepatide [facility]) 5 MG/0.5ML Pen Inject 5 mg into the skin once a week. To start on 05/03/21.   [DISCONTINUED] vitamin B-12 (CYANOCOBALAMIN) 500 MCG tablet Take 500 mcg by mouth at bedtime.   No facility-administered encounter medications on file as of 11/25/2021.    ALLERGIES: Allergies  Allergen Reactions   Codeine Hives, Swelling and Other (See Comments)   Fluticasone Other (See Comments)    Nose bleeds    Other Other (See Comments)    mushrooms    VACCINATION STATUS: Immunization History  Administered Date(s) Administered   Moderna Sars-Covid-2 Vaccination 01/03/2019, 02/03/2019   Pfizer Covid-19 Vaccine Bivalent Booster 64yr & up 11/21/2020    Diabetes She presents for her follow-up diabetic visit. She has type 2 diabetes mellitus. Onset time: She was diagnosed at approximate age of 89years. Her disease course has been  worsening. There are no hypoglycemic associated symptoms. Pertinent negatives for hypoglycemia include no confusion, headaches, pallor or seizures. Associated symptoms include fatigue, polydipsia and polyuria. Pertinent negatives for diabetes include no chest pain and no polyphagia. There are no hypoglycemic complications. Symptoms are worsening. (On repeat condition [place] obesity, hyperlipidemia, hypertension, MASLD, obstructive sleep apnea.) Risk factors for coronary artery disease include dyslipidemia, diabetes mellitus, hypertension, obesity, family history, tobacco exposure, sedentary lifestyle and post-menopausal. Current diabetic treatments: She is currently on Mounjaro 5 mg weekly, Humalog 75/25 50 units a.m., 50 units p.m., Synjardy 12.05/998 mg p.o. once a day at breakfast. Her weight is increasing steadily. She is following a generally unhealthy diet. When asked about meal planning, she reported none. She participates in exercise intermittently. Her home blood glucose trend is increasing steadily. Her breakfast blood glucose range is generally >200 mg/dl. Her lunch blood glucose range is generally >200 mg/dl. Her dinner blood glucose range is generally >200 mg/dl. Her bedtime blood glucose range is generally >200 mg/dl. Her overall blood glucose range is >200 mg/dl. (She presents with her CGM device.  Her AGP report shows worsening profile averaging 277 point-of-care A1c of 10.6%, increasing.  Her AGP report shows 13% time range, 29% level 1 hyperglycemia, 58% level 2 hyperglycemia.  No hypoglycemia.   ) An ACE inhibitor/angiotensin II receptor blocker is being taken.  Hyperlipidemia This is a chronic problem. The current episode started more than 1 year ago. The problem is uncontrolled. Pertinent negatives include no chest pain, myalgias or shortness of breath. Current antihyperlipidemic treatment includes statins. Risk factors for coronary artery disease include dyslipidemia, diabetes mellitus,  family history, obesity, hypertension, a sedentary lifestyle and post-menopausal.  Hypertension This is a chronic problem. The current episode started more than 1 year ago. The problem is controlled. Pertinent negatives include no chest pain, headaches, palpitations or shortness of breath. Risk factors for coronary artery disease include dyslipidemia, diabetes mellitus, obesity, sedentary lifestyle, smoking/tobacco exposure, family history and post-menopausal state. Past treatments include ACE inhibitors.     Review of Systems  Constitutional:  Positive for fatigue. Negative for chills, fever and unexpected weight change.  HENT:  Negative for trouble swallowing and voice change.   Eyes:  Negative for visual disturbance.  Respiratory:  Negative for cough, shortness of breath and wheezing.   Cardiovascular:  Negative for chest pain, palpitations and leg swelling.  Gastrointestinal:  Negative for diarrhea, nausea and vomiting.  Endocrine: Positive for polydipsia and polyuria. Negative for cold intolerance, heat intolerance and polyphagia.  Musculoskeletal:  Negative for arthralgias and myalgias.  Skin:  Negative for color change, pallor, rash and wound.  Neurological:  Negative for seizures and headaches.  Psychiatric/Behavioral:  Negative for confusion and suicidal ideas.     Objective:       11/25/2021    9:40 AM 11/25/2021    9:03 AM 10/20/2021    8:52 AM  Vitals with BMI  Height 5' 0" 5' 0" 5' 0"  Weight 180 lbs 180 lbs 3 oz 182 lbs 6 oz  BMI 35.15 62.69 48.54  Systolic  627 035  Diastolic  72 86  Pulse  88 100    BP 108/72   Pulse 88   Ht 5' (1.524 m)   Wt 180 lb 3.2 oz (81.7 kg)   BMI 35.19 kg/m   Wt Readings from Last 3 Encounters:  11/25/21 180 lb (81.6 kg)  11/25/21 180 lb 3.2 oz (81.7 kg)  10/20/21 182 lb 6.4 oz (82.7 kg)     Physical Exam    CMP ( most recent) CMP     Component Value Date/Time   NA 134 (L) 04/30/2021 0855   K 3.7 04/30/2021 0855   CL  103 04/30/2021 0855   CO2 25 04/30/2021 0855   GLUCOSE 145 (H) 04/30/2021 0855   BUN 16 04/30/2021 0855   CREATININE 0.99 04/30/2021 0855   CALCIUM 9.1 04/30/2021 0855   PROT 6.1 02/12/2010 0400   ALBUMIN 2.3 (L) 02/12/2010 0400   AST 33 02/12/2010 0400   ALT 23 02/12/2010 0400   ALKPHOS 47 02/12/2010 0400   BILITOT 1.1 02/12/2010 0400   GFRNONAA >60 04/30/2021 0855   GFRAA >60 07/31/2010 0430     Diabetic Labs (most recent): Lab Results  Component Value Date   HGBA1C 10.6 (A) 11/25/2021   HGBA1C 9 08/12/2021   HGBA1C 8 08/12/2021     Lipid Panel ( most recent) Lipid Panel     Component Value Date/Time   CHOL 146 02/05/2021 0000   TRIG 285 (A) 02/05/2021 0000   HDL 29 (A) 02/05/2021 0000   CHOLHDL 2.0 02/07/2010 0432   VLDL 9 02/07/2010 0432   LDLCALC 98 02/05/2021 0000      Lab Results  Component Value Date   TSH 0.727 10/11/2017           Assessment & Plan:   1. Type 2 diabetes mellitus with other specified complication, with long-term current use of insulin (HCC)  - Mariah [name] has currently uncontrolled symptomatic type 2 DM since  61 years of age.  She presents with her CGM device.  Her AGP report shows worsening profile averaging 277 point-of-care A1c  of 10.6%, increasing.  Her AGP report shows 13% time range, 29% level 1 hyperglycemia, 58% level 2 hyperglycemia.  No hypoglycemia.    Recent labs reviewed. - I had a long discussion with her about the progressive nature of diabetes and the pathology behind its complications. -her diabetes is complicated by obesity/sedentary life, polypharmacy, obstructive sleep apnea, comorbid conditions of hyperlipidemia, hypertension and she remains at a high risk for more acute and chronic complications which include CAD, CVA, CKD, retinopathy, and neuropathy. These are all discussed in detail with her.  - I discussed all available options of managing her diabetes including de-escalation of medications. I  have counseled her on diet  and weight management  by adopting a Whole Food , Plant Predominant  ( WFPP) nutrition as recommended by [employer] of Lifestyle Medicine. Patient is encouraged to switch to  unprocessed or minimally processed  complex starch, adequate protein intake (mainly plant source), minimal liquid fat , no Solid fat,  plenty of fruits, and vegetables. -  she is advised to stick to a routine mealtimes to eat 3 complete meals a day and snack only when necessary ( to snack only to correct hypoglycemia BG <70 day time or <100 at night).   He did not engage with lifestyle medicine optimally.  However, she would like to try it again.  - she acknowledges that there is a room for improvement in her food and drink choices. - Suggestion is made for her to avoid simple carbohydrates  from her diet including Cakes, Sweet Desserts, Ice Cream, Soda (diet and regular), Sweet Tea, Candies, Chips, Cookies, Store Bought Juices, Alcohol , Artificial Sweeteners,  Coffee Creamer, and "Sugar-free" Products, Lemonade. This will help patient to have more stable blood glucose profile and potentially avoid unintended weight gain.  The following Lifestyle Medicine recommendations according to [place]  [facility]) were discussed and and offered to patient and she  agrees to start the journey:  A. Whole Foods, Plant-Based Nutrition comprising of fruits and vegetables, plant-based proteins, whole-grain carbohydrates was discussed in detail with the patient.   A list for source of those nutrients were also provided to the patient.  Patient will use only water or unsweetened tea for hydration. B.  The need to stay away from risky substances including alcohol, smoking; obtaining 7 to 9 hours of restorative sleep, at least 150 minutes of moderate intensity exercise weekly, the importance of healthy social connections,  and stress management techniques were discussed. C.  A full color page  of  Calorie density of various food groups per pound showing examples of each food groups was provided to the patient.   - she will be scheduled with Jearld Fenton, RDN, CDE for individualized diabetes education.  - I have approached her with the following plan to manage  her diabetes and patient agrees:   -  I discussed and increased her Humalog 75/25 to 70 units with breakfast and 70 units with supper,  only when Premeal blood glucose readings are above 90 mg per DL, associated with continuous monitoring of blood glucose using her CGM.   -Because her engagement to lifestyle medicine is suboptimal.  - she is warned not to take insulin without proper monitoring per orders. - Adjustment parameters are given to her for hypo and hyperglycemia in writing. - she is encouraged to call clinic for blood glucose levels less than 70 or above 200 mg /dl. - she is advised to continue Synjardy to 12.05/998  mg XR p.o. daily at breakfast .    -I discussed and increase Mounjaro to 7.5 mg subcutaneously weekly.   I discussed with the patient a plan to use this medication only for short-term.   - Specific targets for  A1c;  LDL, HDL,  and Triglycerides were discussed with the patient.  2) Blood Pressure /Hypertension:   -Her blood pressure is controlled to target.  she is advised to continue her current medications including lisinopril 20 mg p.o. daily with breakfast .  She should be considered for lower dose of lisinopril, 10 mg by her next refill. 3) Lipids/Hyperlipidemia:   Review of her recent lipid panel showed improved LDL at 67, lowering from 98.    she  is advised to continue    pravastatin 40 mg daily at bedtime.  Side effects and precautions discussed with her.  Multiple plant-based diet discussed and recommended above will help with dyslipidemia as well.   4)  Weight/Diet:  Body mass index is 35.19 kg/m.  -   clearly complicating her diabetes care.   she is  a candidate for weight loss. I  discussed with her the fact that loss of 5 - 10% of her  current body weight will have the most impact on her diabetes management.  The above detailed  ACLM recommendations for nutrition, exercise, sleep, social life, avoidance of risky substances, the need for restorative sleep   information will also detailed on discharge instructions.   5) abnormal liver ultrasound with history of nonalcoholic fatty liver disease 2 ill-defined areas of lesions in the liver, favored for fatty sparing.  However, since she has established GI care, I asked her to make a phone call and see her GI specialist for better explanation and further evaluation. Options are to repeat ultrasound in 6 months for possible MRI for better characterization of the identified lesions in the liver. She has hypoalbuminemia, elevated PT, will need a better assessment of her liver function and anatomy.    6) Chronic Care/Health Maintenance:  -she  is on ACEI/ARB and Statin medications and  is encouraged to initiate and continue to follow up with Ophthalmology, Dentist,  Podiatrist at least yearly or according to recommendations, and advised to   stay away from smoking. I have recommended yearly flu vaccine and pneumonia vaccine at least every 5 years; moderate intensity exercise for up to 150 minutes weekly; and  sleep for 7- 9 hours a day.  She is advised to discontinue her vitamin B12 supplements.   - she is  advised to maintain close follow up with Maury Dus, MD for primary care needs, as well as her other providers for optimal and coordinated care.    I spent 41 minutes in the care of the patient today including review of labs from [place], Lipids, Thyroid Function, Hematology (current and previous including abstractions from other facilities); face-to-face time discussing  her blood glucose readings/logs, discussing hypoglycemia and hyperglycemia episodes and symptoms, medications doses, her options of short and long term treatment  based on the latest standards of care / guidelines;  discussion about incorporating lifestyle medicine;  and documenting the encounter. Risk reduction counseling performed per USPSTF guidelines to reduce  obesity and cardiovascular risk factors.     Please refer to Patient Instructions for Blood Glucose Monitoring and Insulin/Medications Dosing Guide"  in media tab for additional information. Please  also refer to " Patient Self Inventory" in the Media  tab for reviewed elements of pertinent patient history.  Mariah [name] participated in the discussions, expressed understanding, and voiced agreement with the above plans.  All questions were answered to her satisfaction. she is encouraged to contact clinic should she have any questions or concerns prior to her return visit.   Follow up plan: - Return in about 4 weeks (around 12/23/2021) for F/U with Meter/CGM [facility] Only - no Labs.  Glade Lloyd, MD [facility] [address] [place], [place] [address] Phone: [phone]  Fax: [phone]    11/25/2021, 12:52 PM  This note was partially dictated with voice recognition software. Similar sounding words can be transcribed inadequately or may not  be corrected upon review.

## 2021-11-25 NOTE — Patient Instructions (Signed)

## 2021-11-27 ENCOUNTER — Other Ambulatory Visit (HOSPITAL_COMMUNITY): Payer: Self-pay

## 2021-11-30 DIAGNOSIS — S2002XA Contusion of left breast, initial encounter: Secondary | ICD-10-CM | POA: Diagnosis not present

## 2021-11-30 DIAGNOSIS — R079 Chest pain, unspecified: Secondary | ICD-10-CM | POA: Diagnosis not present

## 2021-12-01 ENCOUNTER — Encounter: Payer: Self-pay | Admitting: Nutrition

## 2021-12-04 ENCOUNTER — Telehealth: Payer: Self-pay | Admitting: "Endocrinology

## 2021-12-04 ENCOUNTER — Other Ambulatory Visit (HOSPITAL_COMMUNITY): Payer: Self-pay

## 2021-12-04 DIAGNOSIS — R9389 Abnormal findings on diagnostic imaging of other specified body structures: Secondary | ICD-10-CM | POA: Diagnosis not present

## 2021-12-04 DIAGNOSIS — N6489 Other specified disorders of breast: Secondary | ICD-10-CM | POA: Diagnosis not present

## 2021-12-04 DIAGNOSIS — R930 Abnormal findings on diagnostic imaging of skull and head, not elsewhere classified: Secondary | ICD-10-CM | POA: Diagnosis not present

## 2021-12-04 DIAGNOSIS — E042 Nontoxic multinodular goiter: Secondary | ICD-10-CM | POA: Diagnosis not present

## 2021-12-04 DIAGNOSIS — G4733 Obstructive sleep apnea (adult) (pediatric): Secondary | ICD-10-CM | POA: Diagnosis not present

## 2021-12-04 MED ORDER — CYCLOBENZAPRINE HCL 10 MG PO TABS
10.0000 mg | ORAL_TABLET | Freq: Three times a day (TID) | ORAL | 0 refills | Status: DC | PRN
  Filled 2021-12-04: qty 30, 10d supply, fill #0

## 2021-12-04 MED ORDER — TRAMADOL HCL 50 MG PO TABS
50.0000 mg | ORAL_TABLET | Freq: Four times a day (QID) | ORAL | 0 refills | Status: DC | PRN
  Filled 2021-12-04: qty 30, 8d supply, fill #0
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
## 2021-12-07 NOTE — Telephone Encounter (Signed)
Patient was called and a message  as stated by Rayetta Pigg, NP was left on her voicemail.

## 2021-12-07 NOTE — Telephone Encounter (Signed)
I just pulled her CGM report and it doesn't look too bad.  She does have some fluctuating numbers and a spike right before bed which may be due to snacking or late dinner?  Overall still better than when she was in office with Nida last.  I do not recommend any medication changes today.  Encourage eating 3 well balanced meals per day, avoiding sugary drinks and snacking.

## 2021-12-17 ENCOUNTER — Encounter (HOSPITAL_BASED_OUTPATIENT_CLINIC_OR_DEPARTMENT_OTHER): Payer: Self-pay | Admitting: Physician Assistant

## 2021-12-17 ENCOUNTER — Other Ambulatory Visit (HOSPITAL_BASED_OUTPATIENT_CLINIC_OR_DEPARTMENT_OTHER): Payer: Self-pay | Admitting: Physician Assistant

## 2021-12-17 DIAGNOSIS — E042 Nontoxic multinodular goiter: Secondary | ICD-10-CM

## 2021-12-18 ENCOUNTER — Other Ambulatory Visit (HOSPITAL_BASED_OUTPATIENT_CLINIC_OR_DEPARTMENT_OTHER): Payer: Self-pay | Admitting: Physician Assistant

## 2021-12-18 DIAGNOSIS — R930 Abnormal findings on diagnostic imaging of skull and head, not elsewhere classified: Secondary | ICD-10-CM

## 2021-12-24 ENCOUNTER — Encounter: Payer: Self-pay | Admitting: "Endocrinology

## 2021-12-24 ENCOUNTER — Ambulatory Visit: Payer: 59 | Admitting: Nutrition

## 2021-12-24 ENCOUNTER — Other Ambulatory Visit (HOSPITAL_COMMUNITY): Payer: Self-pay

## 2021-12-24 ENCOUNTER — Ambulatory Visit: Payer: 59 | Admitting: "Endocrinology

## 2021-12-24 ENCOUNTER — Ambulatory Visit (HOSPITAL_BASED_OUTPATIENT_CLINIC_OR_DEPARTMENT_OTHER)
Admission: RE | Admit: 2021-12-24 | Discharge: 2021-12-24 | Disposition: A | Discharge status: Home / Self Care | Payer: 59 | Source: Ambulatory Visit | Attending: Physician Assistant | Admitting: Physician Assistant

## 2021-12-24 VITALS — BP 126/78 | HR 88 | Ht 60.0 in | Wt 177.0 lb

## 2021-12-24 DIAGNOSIS — E041 Nontoxic single thyroid nodule: Secondary | ICD-10-CM | POA: Diagnosis not present

## 2021-12-24 DIAGNOSIS — Z794 Long term (current) use of insulin: Secondary | ICD-10-CM

## 2021-12-24 DIAGNOSIS — Z6835 Body mass index (BMI) 35.0-35.9, adult: Secondary | ICD-10-CM

## 2021-12-24 DIAGNOSIS — I10 Essential (primary) hypertension: Secondary | ICD-10-CM

## 2021-12-24 DIAGNOSIS — E782 Mixed hyperlipidemia: Secondary | ICD-10-CM | POA: Diagnosis not present

## 2021-12-24 DIAGNOSIS — E1169 Type 2 diabetes mellitus with other specified complication: Secondary | ICD-10-CM | POA: Diagnosis not present

## 2021-12-24 DIAGNOSIS — K760 Fatty (change of) liver, not elsewhere classified: Secondary | ICD-10-CM

## 2021-12-24 DIAGNOSIS — E042 Nontoxic multinodular goiter: Secondary | ICD-10-CM | POA: Diagnosis not present

## 2021-12-24 MED ORDER — HUMALOG MIX 75/25 KWIKPEN (75-25) 100 UNIT/ML ~~LOC~~ SUPN: 80.0000 [IU] | PEN_INJECTOR | Freq: Two times a day (BID) | SUBCUTANEOUS | 2 refills | Status: DC

## 2021-12-24 MED ORDER — TIRZEPATIDE 10 MG/0.5ML ~~LOC~~ SOAJ
10.0000 mg | SUBCUTANEOUS | 1 refills | Status: DC
  Filled 2021-12-24: qty 2, 28d supply, fill #0
  Filled 2022-02-22 – 2022-03-09 (×2): qty 2, 28d supply, fill #1
  Filled 2022-03-26 – 2022-04-19 (×2): qty 2, 28d supply, fill #2
  Filled 2022-05-23: qty 2, 28d supply, fill #3
  Filled 2022-06-24: qty 2, 28d supply, fill #4
  Filled 2022-07-21: qty 2, 28d supply, fill #5

## 2021-12-24 MED ORDER — IOHEXOL 300 MG/ML  SOLN
100.0000 mL | Freq: Once | INTRAMUSCULAR | Status: AC | PRN
  Administered 2021-12-24: 75 mL via INTRAVENOUS

## 2021-12-24 NOTE — Progress Notes (Signed)
12/24/2021, 9:34 AM  Endocrinology follow-up note   Subjective:    Patient ID: Mariah [name], female    DOB: [birth year].  Mariah [name] is being seen in follow-up after she was seen in consultation for management of currently uncontrolled symptomatic diabetes requested by  Maury Dus, MD.   Past Medical History:  Diagnosis Date   Allergy    Anemia    Anxiety    Arthritis    Asthma    Body odor    from colostomy takedown   Cervicalgia    Depression    Diabetes mellitus without complication (HCC)    Fibroid    GERD (gastroesophageal reflux disease)    Glaucoma    Headache    Heart murmur    as a child   Hyperlipidemia    Hypertension    Hypothyroidism    Infertility    OSA (obstructive sleep apnea)    upper airway resistance syndrome on CPAP at 9cm H2O   Reflux    Vertigo     Past Surgical History:  Procedure Laterality Date   COLECTOMY WITH COLOSTOMY CREATION/HARTMANN PROCEDURE  02/07/2010   Perforated   COLOSTOMY TAKEDOWN  07/28/2010   Lap colostomy takedown   LAPAROTOMY N/A 05/07/2021   Procedure: ABDOMINAL WALL EXPLORATION, PROBABLE REMOVAL OF FOREIGN BODY;  Surgeon: Michael Boston, MD;  Location: [facility];  Service: General;  Laterality: N/A;   TONSILLECTOMY     VENTRAL HERNIA REPAIR  02/07/2010   Primary repair of old hernia    Social History   Socioeconomic History   Marital status: Married    Spouse name: Not on file   Number of children: Not on file   Years of education: Not on file   Highest education level: Not on file  Occupational History   Not on file  Tobacco Use   Smoking status: Former    Types: Cigarettes    Quit date: 11/19/1989    Years since quitting: 32.1   Smokeless tobacco: Never  Vaping Use   Vaping Use: Never used  Substance and Sexual Activity   Alcohol use: Yes    Comment: Social   Drug use: No   Sexual activity: Not  Currently    Partners: Male    Birth control/protection: Post-menopausal    Comment: 1ST intercourse-17, partners- 46, married- 86 yrs   Other Topics Concern   Not on file  Social History Narrative   Not on file   Social Determinants of Health   Financial Resource Strain: Not on file  Food Insecurity: Not on file  Transportation Needs: Not on file  Physical Activity: Not on file  Stress: Not on file  Social Connections: Not on file    Family History  Problem Relation Age of Onset   Diabetes Mother    Hypertension Mother    Hyperlipidemia Mother    Diabetes Father    Hypertension Father    Heart attack Father    Diabetes Maternal Grandmother    Hypertension Maternal Grandmother    Heart disease Maternal Grandmother    Cancer Maternal Grandfather        COLON   Breast cancer Paternal Grandmother  Age 40's    Outpatient Encounter Medications as of 12/24/2021  Medication Sig   tirzepatide (MOUNJARO) 10 MG/0.5ML Pen Inject 10 mg into the skin once a week.   ADVAIR DISKUS 500-50 MCG/ACT AEPB Inhale 1 puff into the lungs in the morning and at bedtime.   albuterol (VENTOLIN HFA) 108 (90 Base) MCG/ACT inhaler Inhale 1 - 2 puffs into the lungs every 6 hours as needed for wheezing or shortness of breath.   aspirin 81 MG EC tablet Take 81 mg by mouth daily.   aspirin-acetaminophen-caffeine (EXCEDRIN MIGRAINE) 250-250-65 MG tablet Take 1 tablet by mouth every 6 (six) hours as needed for headache.   azelastine (ASTELIN) 0.1 % nasal spray Place 1 spray into each nostril 1 - 2 times daily.   Biotin 2500 MCG CAPS Take 2,500 mcg by mouth at bedtime.   brimonidine (ALPHAGAN P) 0.1 % SOLN Place 1 drop into both eyes 2 (two) times daily.   cetirizine (ZYRTEC) 10 MG tablet Take 1 tablet (10 mg total) by mouth at bedtime.   cholecalciferol (VITAMIN D3) 25 MCG (1000 UNIT) tablet Take 1,000 Units by mouth at bedtime.   Continuous Blood Gluc Sensor (FREESTYLE LIBRE 3 SENSOR) MISC Inject 1  sensor to the skin every 14 days for continuous glucose monitoring as directed.   COVID-19 mRNA bivalent vaccine, Pfizer, (PFIZER COVID-19 VAC BIVALENT) injection Inject into the muscle.   cyclobenzaprine (FLEXERIL) 10 MG tablet Take 1 tablet (10 mg total) by mouth 3 (three) times daily as needed for 10 days   DULoxetine (CYMBALTA) 60 MG capsule Take 1 capsule by mouth once daily (Patient taking differently: Take 60 mg by mouth at bedtime.)   DULoxetine (CYMBALTA) 60 MG capsule Take 1 capsule (60 mg total) by mouth daily.   Empagliflozin-metFORMIN HCl ER (SYNJARDY XR) 12.05-998 MG TB24 Take 1 tablet by mouth 2 (two) times daily with a meal. Stop separate Jardiance and Metformin. (Patient taking differently: Take 1 tablet by mouth daily.)   EPINEPHrine 0.3 mg/0.3 mL IJ SOAJ injection Inject 0.3 mg into the muscle as needed for anaphylaxis.   Glucagon, rDNA, (GLUCAGON EMERGENCY) 1 MG KIT Use as directed.   glycopyrrolate (ROBINUL) 1 MG tablet Take 1 tablet (1 mg total) by mouth 2 (two) times daily.   HUMALOG MIX 75/25 KWIKPEN (75-25) 100 UNIT/ML KwikPen Inject 80 Units into the skin 2 (two) times daily before a meal.   hydrocortisone 2.5 % cream Apply 1 liberally to affected area once a day Can use in the Am or PM   Insulin Pen Needle 31G X 5 MM MISC Use to inject insulin   ketoconazole (NIZORAL) 2 % cream Apply cream to affected area once a day. Can use in the morning or evening   lisinopril (ZESTRIL) 10 MG tablet Take 10 mg by mouth daily.   methylphenidate (CONCERTA) 27 MG PO CR tablet Take 1 tablet by mouth every morning   methylphenidate (CONCERTA) 27 MG PO CR tablet Take 1 tablet (27 mg total) by mouth every morning   methylphenidate (CONCERTA) 27 MG PO CR tablet Take 1 tablet (27 mg total) by mouth every morning.   Multiple Vitamins-Minerals (OCUVITE PO) Take 1 tablet by mouth daily.   Niacin (VITAMIN B-3 PO) Take 1 tablet by mouth daily.   olopatadine (PATANOL) 0.1 % ophthalmic solution  Place 1 drop into both eyes 2 times daily.   pantoprazole (PROTONIX) 40 MG tablet Take 1 tablet by mouth every morning   Polyethyl Glycol-Propyl Glycol (SYSTANE) 0.4-0.3 %  SOLN Apply 1 drop to eye as needed.   pravastatin (PRAVACHOL) 40 MG tablet Take 1 tablet by mouth at bedtime   Probiotic Product (PROBIOTIC DAILY PO) Take 1 capsule by mouth at bedtime.   traMADol (ULTRAM) 50 MG tablet Take 1-2 tablets (50-100 mg total) by mouth every 6 (six) hours as needed for moderate pain or severe pain.   traMADol (ULTRAM) 50 MG tablet Take 1 tablet (50 mg total) by mouth every 6 (six) hours as needed for pain   triamcinolone cream (KENALOG) 0.1 % Apply externally twice a day as needed   vitamin C (ASCORBIC ACID) 250 MG tablet Take 250 mg by mouth at bedtime.   zolpidem (AMBIEN) 10 MG tablet Take 1/2-1 tablet by mouth at bedtime, as needed (30 days)   [DISCONTINUED] HUMALOG MIX 75/25 KWIKPEN (75-25) 100 UNIT/ML KwikPen Inject 70 Units into the skin 2 (two) times daily before a meal.   [DISCONTINUED] tirzepatide (MOUNJARO) 7.5 MG/0.5ML Pen Inject 7.5 mg into the skin once a week.   No facility-administered encounter medications on file as of 12/24/2021.    ALLERGIES: Allergies  Allergen Reactions   Codeine Hives, Swelling and Other (See Comments)   Fluticasone Other (See Comments)    Nose bleeds    Other Other (See Comments)    mushrooms    VACCINATION STATUS: Immunization History  Administered Date(s) Administered   Moderna Sars-Covid-2 Vaccination 01/03/2019, 02/03/2019, 09/14/2019   Pfizer Covid-19 Vaccine Bivalent Booster 45yr & up 11/21/2020    Diabetes She presents for her follow-up diabetic visit. She has type 2 diabetes mellitus. Onset time: She was diagnosed at approximate age of 89years. Her disease course has been improving. There are no hypoglycemic associated symptoms. Pertinent negatives for hypoglycemia include no confusion, headaches, pallor or seizures. Associated symptoms  include fatigue, polydipsia and polyuria. Pertinent negatives for diabetes include no chest pain and no polyphagia. There are no hypoglycemic complications. Symptoms are improving. (On repeat condition [place]a obesity, hyperlipidemia, hypertension, MASLD, obstructive sleep apnea.) Risk factors for coronary artery disease include dyslipidemia, diabetes mellitus, hypertension, obesity, family history, tobacco exposure, sedentary lifestyle and post-menopausal. Current diabetic treatments: She is currently on Mounjaro 7.5 mg weekly, Humalog 75/25 70 units a.m., 70 units p.m., Synjardy 12.05/998 mg p.o. once a day at breakfast. Her weight is decreasing steadily. She is following a generally unhealthy diet. When asked about meal planning, she reported none. She participates in exercise intermittently. Her home blood glucose trend is decreasing steadily. Her breakfast blood glucose range is generally 180-200 mg/dl. Her lunch blood glucose range is generally 180-200 mg/dl. Her dinner blood glucose range is generally 180-200 mg/dl. Her bedtime blood glucose range is generally 180-200 mg/dl. Her overall blood glucose range is 180-200 mg/dl. (She presents with her CGM device.  Her AGP report shows improving glycemic profile averaging 200 mg per DL for the last 14 days.  She has 45% time in range, 34% level 1 hyperglycemia, 21% level 2 hyperglycemia.  She did not have any hypoglycemia.  Her recent point-of-care A1c was high at 10.6%.  ) An ACE inhibitor/angiotensin II receptor blocker is being taken.  Hyperlipidemia This is a chronic problem. The current episode started more than 1 year ago. The problem is uncontrolled. Pertinent negatives include no chest pain, myalgias or shortness of breath. Current antihyperlipidemic treatment includes statins. Risk factors for coronary artery disease include dyslipidemia, diabetes mellitus, family history, obesity, hypertension, a sedentary lifestyle and post-menopausal.   Hypertension This is a chronic problem. The current  episode started more than 1 year ago. The problem is controlled. Pertinent negatives include no chest pain, headaches, palpitations or shortness of breath. Risk factors for coronary artery disease include dyslipidemia, diabetes mellitus, obesity, sedentary lifestyle, smoking/tobacco exposure, family history and post-menopausal state. Past treatments include ACE inhibitors.     Review of Systems  Constitutional:  Positive for fatigue. Negative for chills, fever and unexpected weight change.  HENT:  Negative for trouble swallowing and voice change.   Eyes:  Negative for visual disturbance.  Respiratory:  Negative for cough, shortness of breath and wheezing.   Cardiovascular:  Negative for chest pain, palpitations and leg swelling.  Gastrointestinal:  Negative for diarrhea, nausea and vomiting.  Endocrine: Positive for polydipsia and polyuria. Negative for cold intolerance, heat intolerance and polyphagia.  Musculoskeletal:  Negative for arthralgias and myalgias.  Skin:  Negative for color change, pallor, rash and wound.  Neurological:  Negative for seizures and headaches.  Psychiatric/Behavioral:  Negative for confusion and suicidal ideas.     Objective:       12/24/2021    8:08 AM 11/25/2021    9:40 AM 11/25/2021    9:03 AM  Vitals with BMI  Height _0  _1  _2   Weight 177 lbs 180 lbs 180 lbs 3 oz  BMI 34.57 63.84 66.59  Systolic 935  701  Diastolic 78  72  Pulse 88  88    BP 126/78   Pulse 88   Ht 5' (1.524 m)   Wt 177 lb (80.3 kg)   BMI 34.57 kg/m   Wt Readings from Last 3 Encounters:  12/24/21 177 lb (80.3 kg)  11/25/21 180 lb (81.6 kg)  11/25/21 180 lb 3.2 oz (81.7 kg)     Physical Exam    CMP ( most recent) CMP     Component Value Date/Time   NA 134 (L) 04/30/2021 0855   K 3.7 04/30/2021 0855   CL 103 04/30/2021 0855   CO2 25 04/30/2021 0855   GLUCOSE 145 (H) 04/30/2021 0855   BUN 16 04/30/2021  0855   CREATININE 0.99 04/30/2021 0855   CALCIUM 9.1 04/30/2021 0855   PROT 6.1 02/12/2010 0400   ALBUMIN 2.3 (L) 02/12/2010 0400   AST 33 02/12/2010 0400   ALT 23 02/12/2010 0400   ALKPHOS 47 02/12/2010 0400   BILITOT 1.1 02/12/2010 0400   GFRNONAA >60 04/30/2021 0855   GFRAA >60 07/31/2010 0430     Diabetic Labs (most recent): Lab Results  Component Value Date   HGBA1C 10.6 (A) 11/25/2021   HGBA1C 9 08/12/2021   HGBA1C 8 08/12/2021     Lipid Panel ( most recent) Lipid Panel     Component Value Date/Time   CHOL 146 02/05/2021 0000   TRIG 285 (A) 02/05/2021 0000   HDL 29 (A) 02/05/2021 0000   CHOLHDL 2.0 02/07/2010 0432   VLDL 9 02/07/2010 0432   LDLCALC 98 02/05/2021 0000      Lab Results  Component Value Date   TSH 0.727 10/11/2017           Assessment & Plan:   1. Type 2 diabetes mellitus with other specified complication, with long-term current use of insulin (HCC)  - Tanda Morrissey [name] has currently uncontrolled symptomatic type 2 DM since  61 years of age.  She presents with her CGM device.  Her AGP report shows improving glycemic profile averaging 200 mg per DL for the last 14 days.  She has 45% time in range,  34% level 1 hyperglycemia, 21% level 2 hyperglycemia.  She did not have any hypoglycemia.  Her recent point-of-care A1c was high at 10.6%.    Recent labs reviewed. - I had a long discussion with her about the progressive nature of diabetes and the pathology behind its complications. -her diabetes is complicated by obesity/sedentary life, polypharmacy, obstructive sleep apnea, comorbid conditions of hyperlipidemia, hypertension and she remains at a high risk for more acute and chronic complications which include CAD, CVA, CKD, retinopathy, and neuropathy. These are all discussed in detail with her.  - I discussed all available options of managing her diabetes including de-escalation of medications. I have counseled her on diet  and weight  management  by adopting a Whole Food , Plant Predominant  ( WFPP) nutrition as recommended by [employer] of Lifestyle Medicine. Patient is encouraged to switch to  unprocessed or minimally processed  complex starch, adequate protein intake (mainly plant source), minimal liquid fat , no Solid fat,  plenty of fruits, and vegetables. -  she is advised to stick to a routine mealtimes to eat 3 complete meals a day and snack only when necessary ( to snack only to correct hypoglycemia BG <70 day time or <100 at night).   He did not engage with lifestyle medicine optimally.  However, she would like to try it again.  - she acknowledges that there is a room for improvement in her food and drink choices. - Suggestion is made for her to avoid simple carbohydrates  from her diet including Cakes, Sweet Desserts, Ice Cream, Soda (diet and regular), Sweet Tea, Candies, Chips, Cookies, Store Bought Juices, Alcohol , Artificial Sweeteners,  Coffee Creamer, and "Sugar-free" Products, Lemonade. This will help patient to have more stable blood glucose profile and potentially avoid unintended weight gain.  The following Lifestyle Medicine recommendations according to [facility]) were discussed and and offered to patient and she  agrees to start the journey:  A. Whole Foods, Plant-Based Nutrition comprising of fruits and vegetables, plant-based proteins, whole-grain carbohydrates was discussed in detail with the patient.   A list for source of those nutrients were also provided to the patient.  Patient will use only water or unsweetened tea for hydration. B.  The need to stay away from risky substances including alcohol, smoking; obtaining 7 to 9 hours of restorative sleep, at least 150 minutes of moderate intensity exercise weekly, the importance of healthy social connections,  and stress management techniques were discussed. C.  A full color page of  Calorie density of various food  groups per pound showing examples of each food groups was provided to the patient.   - she will be scheduled with Jearld Fenton, RDN, CDE for individualized diabetes education.  - I have approached her with the following plan to manage  her diabetes and patient agrees:   -  I discussed and increased Humalog 75/25 to 80 units with breakfast and 80 units with supper,  only when Premeal blood glucose readings are above 90 mg per DL, associated with continuous monitoring of blood glucose using her CGM.   -Because her engagement to lifestyle medicine is suboptimal.  - she is warned not to take insulin without proper monitoring per orders. - Adjustment parameters are given to her for hypo and hyperglycemia in writing. - she is encouraged to call clinic for blood glucose levels less than 70 or above 200 mg /dl. - she is advised to continue Synjardy to  12.05/998 mg XR p.o. daily at breakfast .    -She appears to be benefiting from [place].  I discussed and increased her Mounjaro to 10 mg subcutaneously weekly.   I discussed with the patient a plan to use this medication only for short-term.   - Specific targets for  A1c;  LDL, HDL,  and Triglycerides were discussed with the patient.  2) Blood Pressure /Hypertension:   -Her blood pressure is controlled to target.  she is advised to continue her current medications including lisinopril 20 mg p.o. daily with breakfast .  She should be considered for lower dose of lisinopril, 10 mg by her next refill.  3) Lipids/Hyperlipidemia:   Review of her recent lipid panel showed improved LDL at 67, lowering from 98.    she  is advised to continue pravastatin 40 mg p.o. daily at bedtime. Side effects and precautions discussed with her.  Multiple plant-based diet discussed and recommended above will help with dyslipidemia as well.   4)  Weight/Diet:  Body mass index is 34.57 kg/m.  -   clearly complicating her diabetes care.   she is  a candidate for weight  loss. I discussed with her the fact that loss of 5 - 10% of her  current body weight will have the most impact on her diabetes management.  The above detailed  ACLM recommendations for nutrition, exercise, sleep, social life, avoidance of risky substances, the need for restorative sleep   information will also detailed on discharge instructions.   5) abnormal liver ultrasound with history of nonalcoholic fatty liver disease 2 ill-defined areas of lesions in the liver, favored for fatty sparing.  However, since she has established GI care, I asked her to make a phone call and see her GI specialist for better explanation and further evaluation. Options are to repeat ultrasound in 6 months for possible MRI for better characterization of the identified lesions in the liver. She has hypoalbuminemia, elevated PT, will need a better assessment of her liver function and anatomy.    6) Chronic Care/Health Maintenance:  -she  is on ACEI/ARB and Statin medications and  is encouraged to initiate and continue to follow up with Ophthalmology, Dentist,  Podiatrist at least yearly or according to recommendations, and advised to   stay away from smoking. I have recommended yearly flu vaccine and pneumonia vaccine at least every 5 years; moderate intensity exercise for up to 150 minutes weekly; and  sleep for 7- 9 hours a day.  She is advised to discontinue her vitamin B12 supplements.   - she is  advised to maintain close follow up with Maury Dus, MD for primary care needs, as well as her other providers for optimal and coordinated care.   I spent 41 minutes in the care of the patient today including review of labs from [facility], Lipids, Thyroid Function, Hematology (current and previous including abstractions from other facilities); face-to-face time discussing  her blood glucose readings/logs, discussing hypoglycemia and hyperglycemia episodes and symptoms, medications doses, her options of short and long term  treatment based on the latest standards of care / guidelines;  discussion about incorporating lifestyle medicine;  and documenting the encounter. Risk reduction counseling performed per USPSTF guidelines to reduce  obesity and cardiovascular risk factors.     Please refer to Patient Instructions for Blood Glucose Monitoring and Insulin/Medications Dosing Guide"  in media tab for additional information. Please  also refer to " Patient Self Inventory" in the Media  tab for  reviewed elements of pertinent patient history.  Jimmy Stipes [name] participated in the discussions, expressed understanding, and voiced agreement with the above plans.  All questions were answered to her satisfaction. she is encouraged to contact clinic should she have any questions or concerns prior to her return visit.   Follow up plan: - Return in about 3 months (around 03/25/2022) for F/U with Pre-visit Labs, Meter/CGM/Logs, A1c here.  Glade Lloyd, MD [facility] [address] [place], [place] [address] Phone: [phone]  Fax: [phone]    12/24/2021, 9:34 AM  This note was partially dictated with voice recognition software. Similar sounding words can be transcribed inadequately or may not  be corrected upon review.

## 2021-12-24 NOTE — Patient Instructions (Signed)

## 2021-12-25 ENCOUNTER — Other Ambulatory Visit (HOSPITAL_COMMUNITY): Payer: Self-pay

## 2021-12-25 ENCOUNTER — Ambulatory Visit (HOSPITAL_BASED_OUTPATIENT_CLINIC_OR_DEPARTMENT_OTHER)
Admission: RE | Admit: 2021-12-25 | Discharge: 2021-12-25 | Disposition: A | Discharge status: Home / Self Care | Payer: 59 | Source: Ambulatory Visit | Attending: Physician Assistant | Admitting: Physician Assistant

## 2021-12-25 ENCOUNTER — Other Ambulatory Visit: Payer: Self-pay

## 2021-12-25 DIAGNOSIS — R9082 White matter disease, unspecified: Secondary | ICD-10-CM | POA: Insufficient documentation

## 2021-12-25 DIAGNOSIS — R930 Abnormal findings on diagnostic imaging of skull and head, not elsewhere classified: Secondary | ICD-10-CM | POA: Diagnosis present

## 2021-12-25 DIAGNOSIS — Z041 Encounter for examination and observation following transport accident: Secondary | ICD-10-CM | POA: Diagnosis not present

## 2021-12-25 MED ORDER — GADOBUTROL 1 MMOL/ML IV SOLN
7.5000 mL | Freq: Once | INTRAVENOUS | Status: AC | PRN
  Administered 2021-12-25: 7.5 mL via INTRAVENOUS
  Filled 2021-12-25: qty 7.5

## 2021-12-29 ENCOUNTER — Other Ambulatory Visit (HOSPITAL_COMMUNITY): Payer: Self-pay

## 2022-01-14 ENCOUNTER — Encounter: Payer: Self-pay | Admitting: "Endocrinology

## 2022-01-15 ENCOUNTER — Other Ambulatory Visit (HOSPITAL_COMMUNITY): Payer: Self-pay

## 2022-01-15 DIAGNOSIS — R932 Abnormal findings on diagnostic imaging of liver and biliary tract: Secondary | ICD-10-CM | POA: Diagnosis not present

## 2022-01-15 DIAGNOSIS — K219 Gastro-esophageal reflux disease without esophagitis: Secondary | ICD-10-CM | POA: Diagnosis not present

## 2022-01-15 MED ORDER — PANTOPRAZOLE SODIUM 40 MG PO TBEC
40.0000 mg | DELAYED_RELEASE_TABLET | Freq: Every morning | ORAL | 11 refills | Status: DC
  Filled 2022-01-15 – 2022-03-26 (×3): qty 30, 30d supply, fill #0
  Filled 2022-04-28: qty 90, 90d supply, fill #1
  Filled 2022-10-13: qty 90, 90d supply, fill #2

## 2022-01-16 ENCOUNTER — Other Ambulatory Visit: Payer: Self-pay | Admitting: Gastroenterology

## 2022-01-16 DIAGNOSIS — R9389 Abnormal findings on diagnostic imaging of other specified body structures: Secondary | ICD-10-CM

## 2022-01-16 DIAGNOSIS — R932 Abnormal findings on diagnostic imaging of liver and biliary tract: Secondary | ICD-10-CM

## 2022-01-18 ENCOUNTER — Encounter: Payer: Self-pay | Admitting: Diagnostic Neuroimaging

## 2022-01-18 ENCOUNTER — Ambulatory Visit (INDEPENDENT_AMBULATORY_CARE_PROVIDER_SITE_OTHER): Payer: Commercial Managed Care - PPO | Admitting: Diagnostic Neuroimaging

## 2022-01-18 VITALS — BP 115/72 | HR 92 | Ht 60.0 in | Wt 176.4 lb

## 2022-01-18 DIAGNOSIS — Z794 Long term (current) use of insulin: Secondary | ICD-10-CM

## 2022-01-18 DIAGNOSIS — R9089 Other abnormal findings on diagnostic imaging of central nervous system: Secondary | ICD-10-CM | POA: Diagnosis not present

## 2022-01-18 DIAGNOSIS — E1169 Type 2 diabetes mellitus with other specified complication: Secondary | ICD-10-CM | POA: Diagnosis not present
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
## 2022-01-19 ENCOUNTER — Telehealth: Payer: Self-pay | Admitting: Diagnostic Neuroimaging

## 2022-01-19 NOTE — Telephone Encounter (Signed)
[employer] sent to GI they obtain auth [id number] 

## 2022-02-03 ENCOUNTER — Encounter: Payer: Self-pay | Admitting: *Deleted

## 2022-02-04 ENCOUNTER — Ambulatory Visit
Admission: RE | Admit: 2022-02-04 | Discharge: 2022-02-04 | Disposition: A | Discharge status: Home / Self Care | Payer: Commercial Managed Care - PPO | Source: Ambulatory Visit | Attending: Diagnostic Neuroimaging | Admitting: Diagnostic Neuroimaging

## 2022-02-04 DIAGNOSIS — R9089 Other abnormal findings on diagnostic imaging of central nervous system: Secondary | ICD-10-CM

## 2022-02-04 MED ORDER — GADOPICLENOL 0.5 MMOL/ML IV SOLN
7.5000 mL | Freq: Once | INTRAVENOUS | Status: AC | PRN
  Administered 2022-02-04: 7.5 mL via INTRAVENOUS

## 2022-02-04 NOTE — Telephone Encounter (Signed)
LVM for patient asking for a call back regarding form

## 2022-02-05 ENCOUNTER — Encounter: Payer: Self-pay | Admitting: Pediatric Intensive Care

## 2022-02-05 LAB — GLUCOSE, POCT (MANUAL RESULT ENTRY): POC Glucose: 145 mg/dl — AB (ref 70–99)

## 2022-02-06 ENCOUNTER — Ambulatory Visit
Admission: RE | Admit: 2022-02-06 | Discharge: 2022-02-06 | Disposition: A | Discharge status: Home / Self Care | Payer: Commercial Managed Care - PPO | Source: Ambulatory Visit | Attending: Gastroenterology | Admitting: Gastroenterology

## 2022-02-06 DIAGNOSIS — K760 Fatty (change of) liver, not elsewhere classified: Secondary | ICD-10-CM | POA: Diagnosis not present

## 2022-02-06 DIAGNOSIS — R9389 Abnormal findings on diagnostic imaging of other specified body structures: Secondary | ICD-10-CM

## 2022-02-06 DIAGNOSIS — R932 Abnormal findings on diagnostic imaging of liver and biliary tract: Secondary | ICD-10-CM

## 2022-02-06 MED ORDER — GADOPICLENOL 0.5 MMOL/ML IV SOLN
8.0000 mL | Freq: Once | INTRAVENOUS | Status: AC | PRN
  Administered 2022-02-06: 8 mL via INTRAVENOUS

## 2022-02-15 ENCOUNTER — Encounter: Payer: Self-pay | Admitting: *Deleted

## 2022-02-15 NOTE — Progress Notes (Signed)
Pt has PCP at [facility] primary care on [address], as well as ongoing endocrinology care, including CGM. Also being followed by [facility], gyn, GI, and surgical consults as needed. No new SDOH findings noted at this time or actions indicated (being followed by PCP and specialists currently). No further indication for health equity team support indicated at this time.

## 2022-02-18 NOTE — Telephone Encounter (Signed)
Left another voicemail for patient regarding form

## 2022-02-18 NOTE — Telephone Encounter (Signed)
Took $50 form fee for patient, emailed records release. Once received will give to [facility].

## 2022-02-22 ENCOUNTER — Other Ambulatory Visit (HOSPITAL_COMMUNITY): Payer: Self-pay

## 2022-02-22 DIAGNOSIS — Z0289 Encounter for other administrative examinations: Secondary | ICD-10-CM

## 2022-02-22 NOTE — Telephone Encounter (Signed)
Contacted pt to get more details regarding FMLA, LVM rq CB.

## 2022-02-22 NOTE — Telephone Encounter (Signed)
Pt returned call, she is rq FMLA for OV 01/18/22 and MRI date of service 02/04/22. She is doing well and able to work.

## 2022-02-22 NOTE — Telephone Encounter (Signed)
Received release. Dropped forms off to POD 3

## 2022-02-23 ENCOUNTER — Other Ambulatory Visit: Payer: Self-pay

## 2022-02-23 ENCOUNTER — Other Ambulatory Visit (HOSPITAL_COMMUNITY): Payer: Self-pay

## 2022-02-23 NOTE — Telephone Encounter (Signed)
Forms completed, placed on MD desk for review and signature.

## 2022-02-23 NOTE — Telephone Encounter (Signed)
Form completed, placed in MR for pick up

## 2022-02-25 ENCOUNTER — Other Ambulatory Visit (HOSPITAL_COMMUNITY): Payer: Self-pay

## 2022-02-25 NOTE — Telephone Encounter (Signed)
[employer] form, last office visit notes, and MRI reports faxed to [employer] (fax# [phone]) on 02/25/22

## 2022-03-02 ENCOUNTER — Other Ambulatory Visit (HOSPITAL_COMMUNITY): Payer: Self-pay

## 2022-03-02 MED ORDER — PRAVASTATIN SODIUM 40 MG PO TABS
40.0000 mg | ORAL_TABLET | Freq: Every evening | ORAL | 0 refills | Status: DC
  Filled 2022-03-02: qty 90, 90d supply, fill #0

## 2022-03-02 MED ORDER — DULOXETINE HCL 60 MG PO CPEP
60.0000 mg | ORAL_CAPSULE | Freq: Every day | ORAL | 0 refills | Status: DC
  Filled 2022-03-26: qty 90, 90d supply, fill #0

## 2022-03-02 MED ORDER — METHYLPHENIDATE HCL ER (OSM) 27 MG PO TBCR
27.0000 mg | EXTENDED_RELEASE_TABLET | Freq: Every morning | ORAL | 0 refills | Status: DC
  Filled 2022-03-02: qty 30, 30d supply, fill #0

## 2022-03-03 ENCOUNTER — Other Ambulatory Visit (HOSPITAL_COMMUNITY): Payer: Self-pay

## 2022-03-04 ENCOUNTER — Other Ambulatory Visit (HOSPITAL_BASED_OUTPATIENT_CLINIC_OR_DEPARTMENT_OTHER): Payer: Self-pay

## 2022-03-04 ENCOUNTER — Other Ambulatory Visit (HOSPITAL_COMMUNITY): Payer: Self-pay

## 2022-03-04 DIAGNOSIS — G4733 Obstructive sleep apnea (adult) (pediatric): Secondary | ICD-10-CM | POA: Diagnosis not present

## 2022-03-04 MED ORDER — COMIRNATY 30 MCG/0.3ML IM SUSY
PREFILLED_SYRINGE | INTRAMUSCULAR | 0 refills | Status: DC
  Filled 2022-03-04: qty 0.3, 1d supply, fill #0

## 2022-03-09 ENCOUNTER — Other Ambulatory Visit: Payer: Self-pay | Admitting: "Endocrinology

## 2022-03-09 ENCOUNTER — Other Ambulatory Visit (HOSPITAL_COMMUNITY): Payer: Self-pay

## 2022-03-09 MED ORDER — FREESTYLE LIBRE 3 SENSOR MISC
11 refills | Status: DC
  Filled 2022-03-09: qty 2, 28d supply, fill #0
  Filled 2022-03-26: qty 2, 28d supply, fill #1
  Filled 2022-04-28: qty 2, 28d supply, fill #2
  Filled 2022-06-04: qty 2, 28d supply, fill #3
  Filled 2022-07-21: qty 2, 28d supply, fill #4
  Filled 2022-08-28: qty 2, 28d supply, fill #5
  Filled 2022-10-12: qty 2, 28d supply, fill #6
  Filled 2022-11-17: qty 2, 28d supply, fill #7
  Filled 2022-12-10: qty 2, 28d supply, fill #8
  Filled 2022-12-27 – 2023-01-14 (×3): qty 2, 28d supply, fill #9
  Filled 2023-02-03 – 2023-02-09 (×3): qty 2, 28d supply, fill #10

## 2022-03-10 ENCOUNTER — Other Ambulatory Visit (HOSPITAL_COMMUNITY): Payer: Self-pay

## 2022-03-10 ENCOUNTER — Other Ambulatory Visit: Payer: Self-pay

## 2022-03-13 ENCOUNTER — Other Ambulatory Visit (HOSPITAL_COMMUNITY): Payer: Self-pay

## 2022-03-25 DIAGNOSIS — E1169 Type 2 diabetes mellitus with other specified complication: Secondary | ICD-10-CM | POA: Diagnosis not present

## 2022-03-25 DIAGNOSIS — Z794 Long term (current) use of insulin: Secondary | ICD-10-CM | POA: Diagnosis not present

## 2022-03-26 ENCOUNTER — Other Ambulatory Visit: Payer: Self-pay | Admitting: "Endocrinology

## 2022-03-26 ENCOUNTER — Other Ambulatory Visit (HOSPITAL_COMMUNITY): Payer: Self-pay

## 2022-03-26 DIAGNOSIS — Z794 Long term (current) use of insulin: Secondary | ICD-10-CM

## 2022-03-26 LAB — COMPREHENSIVE METABOLIC PANEL
ALT: 18 IU/L (ref 0–32)
AST: 24 IU/L (ref 0–40)
Albumin/Globulin Ratio: 1.3 (ref 1.2–2.2)
Albumin: 4.4 g/dL (ref 3.9–4.9)
Alkaline Phosphatase: 80 IU/L (ref 44–121)
BUN/Creatinine Ratio: 12 (ref 12–28)
BUN: 13 mg/dL (ref 8–27)
Bilirubin Total: 0.8 mg/dL (ref 0.0–1.2)
CO2: 26 mmol/L (ref 20–29)
Calcium: 10.6 mg/dL — ABNORMAL HIGH (ref 8.7–10.3)
Chloride: 96 mmol/L (ref 96–106)
Creatinine, Ser: 1.05 mg/dL — ABNORMAL HIGH (ref 0.57–1.00)
Globulin, Total: 3.3 g/dL (ref 1.5–4.5)
Glucose: 173 mg/dL — ABNORMAL HIGH (ref 70–99)
Potassium: 4.8 mmol/L (ref 3.5–5.2)
Sodium: 137 mmol/L (ref 134–144)
Total Protein: 7.7 g/dL (ref 6.0–8.5)
eGFR: 60 mL/min/{1.73_m2} (ref >59)

## 2022-03-26 LAB — T4, FREE: Free T4: 1.12 ng/dL (ref 0.82–1.77)

## 2022-03-26 LAB — TSH: TSH: 1.67 u[IU]/mL (ref 0.450–4.500)

## 2022-03-26 LAB — LIPID PANEL
Chol/HDL Ratio: 2.9 ratio (ref 0.0–4.4)
Cholesterol, Total: 161 mg/dL (ref 100–199)
HDL: 55 mg/dL (ref >39)
LDL Chol Calc (NIH): 71 mg/dL (ref 0–99)
Triglycerides: 213 mg/dL — ABNORMAL HIGH (ref 0–149)
VLDL Cholesterol Cal: 35 mg/dL (ref 5–40)

## 2022-03-29 ENCOUNTER — Other Ambulatory Visit (HOSPITAL_COMMUNITY): Payer: Self-pay

## 2022-03-29 DIAGNOSIS — F900 Attention-deficit hyperactivity disorder, predominantly inattentive type: Secondary | ICD-10-CM | POA: Diagnosis not present

## 2022-03-29 DIAGNOSIS — G4700 Insomnia, unspecified: Secondary | ICD-10-CM | POA: Diagnosis not present

## 2022-03-29 DIAGNOSIS — I10 Essential (primary) hypertension: Secondary | ICD-10-CM | POA: Diagnosis not present

## 2022-03-29 DIAGNOSIS — E113293 Type 2 diabetes mellitus with mild nonproliferative diabetic retinopathy without macular edema, bilateral: Secondary | ICD-10-CM | POA: Diagnosis not present

## 2022-03-29 DIAGNOSIS — J4520 Mild intermittent asthma, uncomplicated: Secondary | ICD-10-CM | POA: Diagnosis not present

## 2022-03-29 DIAGNOSIS — F419 Anxiety disorder, unspecified: Secondary | ICD-10-CM | POA: Diagnosis not present

## 2022-03-29 DIAGNOSIS — K219 Gastro-esophageal reflux disease without esophagitis: Secondary | ICD-10-CM | POA: Diagnosis not present

## 2022-03-29 DIAGNOSIS — E782 Mixed hyperlipidemia: Secondary | ICD-10-CM | POA: Diagnosis not present

## 2022-03-29 DIAGNOSIS — Z0000 Encounter for general adult medical examination without abnormal findings: Secondary | ICD-10-CM | POA: Diagnosis not present

## 2022-03-29 DIAGNOSIS — J309 Allergic rhinitis, unspecified: Secondary | ICD-10-CM | POA: Diagnosis not present

## 2022-03-29 MED ORDER — DULOXETINE HCL 60 MG PO CPEP
60.0000 mg | ORAL_CAPSULE | Freq: Every day | ORAL | 1 refills | Status: DC
  Filled 2022-03-29: qty 90, 90d supply, fill #0

## 2022-03-29 MED ORDER — LISINOPRIL 20 MG PO TABS
20.0000 mg | ORAL_TABLET | Freq: Every morning | ORAL | 1 refills | Status: DC
  Filled 2022-03-29 (×2): qty 90, 90d supply, fill #0
  Filled 2022-06-24: qty 90, 90d supply, fill #1

## 2022-03-29 MED ORDER — METHYLPHENIDATE HCL ER (OSM) 27 MG PO TBCR
EXTENDED_RELEASE_TABLET | ORAL | 0 refills | Status: DC
  Filled 2022-04-20: qty 30, 30d supply, fill #0

## 2022-03-29 MED ORDER — GLUCAGON EMERGENCY 1 MG IJ KIT
PACK | INTRAMUSCULAR | 0 refills | Status: DC
  Filled 2022-03-29: qty 1, 7d supply, fill #0

## 2022-03-29 MED ORDER — METHYLPHENIDATE HCL ER (OSM) 27 MG PO TBCR
EXTENDED_RELEASE_TABLET | ORAL | 0 refills | Status: DC
  Filled 2022-03-29: qty 30, 30d supply, fill #0

## 2022-03-29 MED ORDER — METHYLPHENIDATE HCL ER (OSM) 27 MG PO TBCR
27.0000 mg | EXTENDED_RELEASE_TABLET | Freq: Every morning | ORAL | 0 refills | Status: DC
  Filled 2022-05-23: qty 30, 30d supply, fill #0

## 2022-03-29 MED ORDER — PANTOPRAZOLE SODIUM 40 MG PO TBEC
40.0000 mg | DELAYED_RELEASE_TABLET | Freq: Every morning | ORAL | 0 refills | Status: DC
  Filled 2022-03-29: qty 90, 90d supply, fill #0

## 2022-03-29 MED ORDER — PRAVASTATIN SODIUM 40 MG PO TABS
40.0000 mg | ORAL_TABLET | Freq: Every day | ORAL | 3 refills | Status: AC
  Filled 2022-03-29 – 2022-06-07 (×2): qty 90, 90d supply, fill #0
  Filled 2022-09-27: qty 90, 90d supply, fill #1
  Filled 2023-03-14: qty 90, 90d supply, fill #2

## 2022-03-30 ENCOUNTER — Other Ambulatory Visit: Payer: Self-pay

## 2022-03-30 ENCOUNTER — Ambulatory Visit: Payer: Commercial Managed Care - PPO | Admitting: "Endocrinology

## 2022-03-30 ENCOUNTER — Other Ambulatory Visit (HOSPITAL_COMMUNITY): Payer: Self-pay

## 2022-03-30 ENCOUNTER — Encounter: Payer: Self-pay | Admitting: "Endocrinology

## 2022-03-30 VITALS — BP 104/70 | HR 88 | Ht 60.0 in | Wt 174.2 lb

## 2022-03-30 DIAGNOSIS — Z794 Long term (current) use of insulin: Secondary | ICD-10-CM | POA: Diagnosis not present

## 2022-03-30 DIAGNOSIS — K760 Fatty (change of) liver, not elsewhere classified: Secondary | ICD-10-CM | POA: Diagnosis not present

## 2022-03-30 DIAGNOSIS — I10 Essential (primary) hypertension: Secondary | ICD-10-CM

## 2022-03-30 DIAGNOSIS — E782 Mixed hyperlipidemia: Secondary | ICD-10-CM

## 2022-03-30 DIAGNOSIS — E1169 Type 2 diabetes mellitus with other specified complication: Secondary | ICD-10-CM | POA: Diagnosis not present

## 2022-03-30 LAB — POCT GLYCOSYLATED HEMOGLOBIN (HGB A1C): HbA1c, POC (controlled diabetic range): 8.3 % — AB (ref 0.0–7.0)

## 2022-03-30 MED ORDER — SYNJARDY XR 12.5-1000 MG PO TB24: 1.0000 | ORAL_TABLET | Freq: Every day | ORAL | 1 refills | Status: DC

## 2022-03-30 NOTE — Patient Instructions (Signed)

## 2022-03-30 NOTE — Progress Notes (Signed)
03/30/2022, 12:22 PM  Endocrinology follow-up note   Subjective:    Patient ID: Mariah [name], female    DOB: Nov 19, 1960.  Mariah [name] is being seen in follow-up after she was seen in consultation for management of currently uncontrolled symptomatic diabetes requested by  Donald Prose, MD.   Past Medical History:  Diagnosis Date   Allergy    Anemia    Anxiety    Arthritis    Asthma    Body odor    from colostomy takedown   Cervicalgia    Depression    Diabetes mellitus without complication (HCC)    Fibroid    GERD (gastroesophageal reflux disease)    Glaucoma    Headache    Heart murmur    as a child   Hyperlipidemia    Hypertension    Hypothyroidism    Infertility    OSA (obstructive sleep apnea)    upper airway resistance syndrome on CPAP at 9cm H2O   Reflux    Vertigo     Past Surgical History:  Procedure Laterality Date   COLECTOMY WITH COLOSTOMY CREATION/HARTMANN PROCEDURE  02/07/2010   Perforated   COLOSTOMY TAKEDOWN  07/28/2010   Lap colostomy takedown   LAPAROTOMY N/A 05/07/2021   Procedure: ABDOMINAL WALL EXPLORATION, PROBABLE REMOVAL OF FOREIGN BODY;  Surgeon: Michael Boston, MD;  Location: [facility];  Service: General;  Laterality: N/A;   TONSILLECTOMY     VENTRAL HERNIA REPAIR  02/07/2010   Primary repair of old hernia    Social History   Socioeconomic History   Marital status: Married    Spouse name: Not on file   Number of children: Not on file   Years of education: Not on file   Highest education level: Not on file  Occupational History   Not on file  Tobacco Use   Smoking status: Former    Types: Cigarettes    Quit date: 11/19/1989    Years since quitting: 32.3   Smokeless tobacco: Never  Vaping Use   Vaping Use: Never used  Substance and Sexual Activity   Alcohol use: Yes    Comment: Social   Drug use: No   Sexual activity: Not Currently     Partners: Male    Birth control/protection: Post-menopausal    Comment: 1ST intercourse-17, partners- 65, married- 38 yrs   Other Topics Concern   Not on file  Social History Narrative   Not on file   Social Determinants of Health   Financial Resource Strain: Not on file  Food Insecurity: No Food Insecurity (02/05/2022)   Hunger Vital Sign    Worried About Running Out of Food in the Last Year: Never true    [place] in the Last Year: Never true  Transportation Needs: No Transportation Needs (02/05/2022)   PRAPARE - Hydrologist (Medical): No    Lack of Transportation (Non-Medical): No  Physical Activity: Not on file  Stress: Not on file  Social Connections: Not on file    Family History  Problem Relation Age of Onset   Diabetes Mother    Hypertension Mother    Hyperlipidemia Mother  Diabetes Father    Hypertension Father    Heart attack Father    Diabetes Maternal Grandmother    Hypertension Maternal Grandmother    Heart disease Maternal Grandmother    Cancer Maternal Grandfather        COLON   Breast cancer Paternal Grandmother        Age 14's    Outpatient Encounter Medications as of 03/30/2022  Medication Sig   ADVAIR DISKUS 500-50 MCG/ACT AEPB Inhale 1 puff into the lungs in the morning and at bedtime.   albuterol (VENTOLIN HFA) 108 (90 Base) MCG/ACT inhaler Inhale 1 - 2 puffs into the lungs every 6 hours as needed for wheezing or shortness of breath.   aspirin 81 MG EC tablet Take 81 mg by mouth daily.   aspirin-acetaminophen-caffeine (EXCEDRIN MIGRAINE) 250-250-65 MG tablet Take 1 tablet by mouth every 6 (six) hours as needed for headache.   azelastine (ASTELIN) 0.1 % nasal spray Place 1 spray into each nostril 1 - 2 times daily.   Biotin 2500 MCG CAPS Take 2,500 mcg by mouth at bedtime.   brimonidine (ALPHAGAN P) 0.1 % SOLN Place 1 drop into both eyes 2 (two) times daily.   cetirizine (ZYRTEC) 10 MG tablet Take 1 tablet (10  mg total) by mouth at bedtime.   cholecalciferol (VITAMIN D3) 25 MCG (1000 UNIT) tablet Take 1,000 Units by mouth at bedtime.   Continuous Blood Gluc Sensor (FREESTYLE LIBRE 3 SENSOR) MISC Inject 1 sensor to the skin every 14 days for continuous glucose monitoring as directed.   COVID-19 mRNA bivalent vaccine, Pfizer, (PFIZER COVID-19 VAC BIVALENT) injection Inject into the muscle.   COVID-19 mRNA vaccine 2023-2024 (COMIRNATY) syringe Inject into the muscle.   DULoxetine (CYMBALTA) 60 MG capsule Take 1 capsule by mouth once daily (Patient taking differently: Take 60 mg by mouth at bedtime.)   DULoxetine (CYMBALTA) 60 MG capsule Take 1 capsule (60 mg total) by mouth daily.   DULoxetine (CYMBALTA) 60 MG capsule Take 1 capsule (60 mg total) by mouth daily.   DULoxetine (CYMBALTA) 60 MG capsule Take 1 capsule (60 mg total) by mouth daily.   Empagliflozin-metFORMIN HCl ER (SYNJARDY XR) 12.05-998 MG TB24 Take 1 tablet by mouth daily with breakfast.   EPINEPHrine 0.3 mg/0.3 mL IJ SOAJ injection Inject 0.3 mg into the muscle as needed for anaphylaxis.   Glucagon, rDNA, (GLUCAGON EMERGENCY) 1 MG KIT Use as directed.   glycopyrrolate (ROBINUL) 1 MG tablet Take 1 tablet (1 mg total) by mouth 2 (two) times daily.   HUMALOG MIX 75/25 KWIKPEN (75-25) 100 UNIT/ML KwikPen Inject 80 Units into the skin 2 (two) times daily before a meal.   hydrocortisone 2.5 % cream Apply 1 liberally to affected area once a day Can use in the Am or PM   Insulin Pen Needle 31G X 5 MM MISC Use to inject insulin   lisinopril (ZESTRIL) 10 MG tablet Take 10 mg by mouth daily.   lisinopril (ZESTRIL) 20 MG tablet Take 1 tablet (20 mg total) by mouth every morning.   methylphenidate (CONCERTA) 27 MG PO CR tablet Take 1 tablet by mouth every morning   methylphenidate (CONCERTA) 27 MG PO CR tablet Take 1 tablet (27 mg total) by mouth every morning   methylphenidate (CONCERTA) 27 MG PO CR tablet Take 1 tablet (27 mg total) by mouth every  morning.   methylphenidate (CONCERTA) 27 MG PO CR tablet Take 1 tablet by mouth in the morning -- fill 30 days after  last prescription   methylphenidate (CONCERTA) 27 MG PO CR tablet 1 tablet in the morning Orally-- fill 30 days after last prescription Once a day   methylphenidate (CONCERTA) 27 MG PO CR tablet Take 1 tablet by mouth in the morning - fill 30 days after last prescription   Multiple Vitamins-Minerals (OCUVITE PO) Take 1 tablet by mouth daily.   Niacin (VITAMIN B-3 PO) Take 1 tablet by mouth daily.   olopatadine (PATANOL) 0.1 % ophthalmic solution Place 1 drop into both eyes 2 times daily.   pantoprazole (PROTONIX) 40 MG tablet Take 1 tablet by mouth every morning   pantoprazole (PROTONIX) 40 MG tablet Take 1 tablet (40 mg total) by mouth every morning.   pantoprazole (PROTONIX) 40 MG tablet Take 1 tablet (40 mg total) by mouth every morning.   Polyethyl Glycol-Propyl Glycol (SYSTANE) 0.4-0.3 % SOLN Apply 1 drop to eye as needed.   pravastatin (PRAVACHOL) 40 MG tablet Take 1 tablet (40 mg total) by mouth at bedtime.   Probiotic Product (PROBIOTIC DAILY PO) Take 1 capsule by mouth at bedtime.   tirzepatide (MOUNJARO) 10 MG/0.5ML Pen Inject 10 mg into the skin once a week.   traMADol (ULTRAM) 50 MG tablet Take 1 tablet (50 mg total) by mouth every 6 (six) hours as needed for pain   triamcinolone cream (KENALOG) 0.1 % Apply externally twice a day as needed   vitamin C (ASCORBIC ACID) 250 MG tablet Take 250 mg by mouth at bedtime.   zolpidem (AMBIEN) 10 MG tablet Take 1/2-1 tablet by mouth at bedtime, as needed (30 days)   [DISCONTINUED] cyclobenzaprine (FLEXERIL) 10 MG tablet Take 1 tablet (10 mg total) by mouth 3 (three) times daily as needed for 10 days   [DISCONTINUED] Empagliflozin-metFORMIN HCl ER (SYNJARDY XR) 12.05-998 MG TB24 Take 1 tablet by mouth 2 (two) times daily with a meal. Stop separate Jardiance and Metformin. (Patient taking differently: Take 1 tablet by mouth daily.)    [DISCONTINUED] ketoconazole (NIZORAL) 2 % cream Apply cream to affected area once a day. Can use in the morning or evening   No facility-administered encounter medications on file as of 03/30/2022.    ALLERGIES: Allergies  Allergen Reactions   Codeine Hives, Swelling and Other (See Comments)   Fluticasone Other (See Comments)    Nose bleeds    Other Other (See Comments)    mushrooms    VACCINATION STATUS: Immunization History  Administered Date(s) Administered   COVID-19, mRNA, vaccine(Comirnaty)12 years and older 03/04/2022   Moderna Sars-Covid-2 Vaccination 01/03/2019, 02/03/2019, 09/14/2019   Pfizer Covid-19 Vaccine Bivalent Booster 36yrs & up 11/21/2020    Diabetes She presents for her follow-up diabetic visit. She has type 2 diabetes mellitus. Onset time: She was diagnosed at approximate age of 65 years. Her disease course has been improving. There are no hypoglycemic associated symptoms. Pertinent negatives for hypoglycemia include no confusion, headaches, pallor or seizures. Pertinent negatives for diabetes include no chest pain, no fatigue, no polydipsia, no polyphagia and no polyuria. There are no hypoglycemic complications. Symptoms are improving. (On repeat condition [place] obesity, hyperlipidemia, hypertension, MASLD, obstructive sleep apnea.) Risk factors for coronary artery disease include dyslipidemia, diabetes mellitus, hypertension, obesity, family history, tobacco exposure, sedentary lifestyle and post-menopausal. Current diabetic treatments: She is currently on Mounjaro 7.5 mg weekly, Humalog 75/25 70 units a.m., 70 units p.m., Synjardy 12.05/998 mg p.o. once a day at breakfast. Her weight is decreasing steadily. She is following a generally unhealthy diet. When asked about meal planning,  she reported none. She participates in exercise intermittently. Her home blood glucose trend is decreasing steadily. Her breakfast blood glucose range is generally 140-180 mg/dl. Her  lunch blood glucose range is generally 140-180 mg/dl. Her dinner blood glucose range is generally 140-180 mg/dl. Her bedtime blood glucose range is generally 140-180 mg/dl. Her overall blood glucose range is 140-180 mg/dl. (She presents with her CGM device.  Her AGP report shows significant improvement in her glycemic profile averaging 180 for the last 14 days.  She has 54% time in range, 37% level 1 hyperglycemia, 9% level 2 hyperglycemia.  0% hypoglycemia.  Her point-of-care A1c is 8.3% improving from 10.6% during her last visit.   ) An ACE inhibitor/angiotensin II receptor blocker is being taken.  Hyperlipidemia This is a chronic problem. The current episode started more than 1 year ago. The problem is uncontrolled. Pertinent negatives include no chest pain, myalgias or shortness of breath. Current antihyperlipidemic treatment includes statins. Risk factors for coronary artery disease include dyslipidemia, diabetes mellitus, family history, obesity, hypertension, a sedentary lifestyle and post-menopausal.  Hypertension This is a chronic problem. The current episode started more than 1 year ago. The problem is controlled. Pertinent negatives include no chest pain, headaches, palpitations or shortness of breath. Risk factors for coronary artery disease include dyslipidemia, diabetes mellitus, obesity, sedentary lifestyle, smoking/tobacco exposure, family history and post-menopausal state. Past treatments include ACE inhibitors.     Review of Systems  Constitutional:  Negative for chills, fatigue, fever and unexpected weight change.  HENT:  Negative for trouble swallowing and voice change.   Eyes:  Negative for visual disturbance.  Respiratory:  Negative for cough, shortness of breath and wheezing.   Cardiovascular:  Negative for chest pain, palpitations and leg swelling.  Gastrointestinal:  Negative for diarrhea, nausea and vomiting.  Endocrine: Negative for cold intolerance, heat intolerance,  polydipsia, polyphagia and polyuria.  Musculoskeletal:  Negative for arthralgias and myalgias.  Skin:  Negative for color change, pallor, rash and wound.  Neurological:  Negative for seizures and headaches.  Psychiatric/Behavioral:  Negative for confusion and suicidal ideas.     Objective:       03/30/2022    8:29 AM 02/05/2022   12:45 PM 01/18/2022   10:17 AM  Vitals with BMI  Height 5\' 0"   5\' 0"   Weight 174 lbs 3 oz  176 lbs 6 oz  BMI [id number]  [id number]  Systolic [id number]  Diastolic 70 88 72  Pulse 88 99 92    BP 104/70   Pulse 88   Ht 5' (1.524 m)   Wt 174 lb 3.2 oz (79 kg)   BMI 34.02 kg/m   Wt Readings from Last 3 Encounters:  03/30/22 174 lb 3.2 oz (79 kg)  01/18/22 176 lb 6.4 oz (80 kg)  12/24/21 177 lb (80.3 kg)     Physical Exam    CMP ( most recent) CMP     Component Value Date/Time   NA 137 03/25/2022 0932   K 4.8 03/25/2022 0932   CL 96 03/25/2022 0932   CO2 26 03/25/2022 0932   GLUCOSE 173 (H) 03/25/2022 0932   GLUCOSE 145 (H) 04/30/2021 0855   BUN 13 03/25/2022 0932   CREATININE 1.05 (H) 03/25/2022 0932   CALCIUM 10.6 (H) 03/25/2022 0932   PROT 7.7 03/25/2022 0932   ALBUMIN 4.4 03/25/2022 0932   AST 24 03/25/2022 0932   ALT 18 03/25/2022 0932   ALKPHOS 80 03/25/2022 0932   BILITOT 0.8 03/25/2022  0932   GFRNONAA >60 04/30/2021 0855   GFRAA >60 07/31/2010 0430    Lab Results  Component Value Date   HGBA1C 8.3 (A) 03/30/2022   HGBA1C 10.6 (A) 11/25/2021   HGBA1C 9 08/12/2021   HGBA1C 8 08/12/2021    Lipid Panel     Component Value Date/Time   CHOL 161 03/25/2022 0932   TRIG 213 (H) 03/25/2022 0932   HDL 55 03/25/2022 0932   CHOLHDL 2.9 03/25/2022 0932   CHOLHDL 2.0 02/07/2010 0432   VLDL 9 02/07/2010 0432   LDLCALC 71 03/25/2022 0932   LABVLDL 35 03/25/2022 0932      Lab Results  Component Value Date   TSH 1.670 03/25/2022   TSH 0.727 10/11/2017   FREET4 1.12 03/25/2022      Assessment & Plan:   1. Type 2 diabetes  mellitus with other specified complication, with long-term current use of insulin (HCC)  - Philamena Gerbers [name] has currently uncontrolled symptomatic type 2 DM since  62 years of age.  She presents with her CGM device.  Her AGP report shows significant improvement in her glycemic profile averaging 180 for the last 14 days.  She has 54% time in range, 37% level 1 hyperglycemia, 9% level 2 hyperglycemia.  0% hypoglycemia.  Her point-of-care A1c is 8.3% improving from 10.6% during her last visit.    Recent labs reviewed. - I had a long discussion with her about the progressive nature of diabetes and the pathology behind its complications. -her diabetes is complicated by obesity/sedentary life, polypharmacy, obstructive sleep apnea, comorbid conditions of hyperlipidemia, hypertension and she remains at a high risk for more acute and chronic complications which include CAD, CVA, CKD, retinopathy, and neuropathy. These are all discussed in detail with her.  - I discussed all available options of managing her diabetes including de-escalation of medications. I have counseled her on diet  and weight management  by adopting a Whole Food , Plant Predominant  ( WFPP) nutrition as recommended by [employer] of Lifestyle Medicine. Patient is encouraged to switch to  unprocessed or minimally processed  complex starch, adequate protein intake (mainly plant source), minimal liquid fat , no Solid fat,  plenty of fruits, and vegetables. -  she is advised to stick to a routine mealtimes to eat 3 complete meals a day and snack only when necessary ( to snack only to correct hypoglycemia BG <70 day time or <100 at night).   He did not engage with lifestyle medicine optimally.  However, she would like to try it again.  - she acknowledges that there is a room for improvement in her food and drink choices. - Suggestion is made for her to avoid simple carbohydrates  from her diet including Cakes, Sweet Desserts, Ice  Cream, Soda (diet and regular), Sweet Tea, Candies, Chips, Cookies, Store Bought Juices, Alcohol , Artificial Sweeteners,  Coffee Creamer, and "Sugar-free" Products, Lemonade. This will help patient to have more stable blood glucose profile and potentially avoid unintended weight gain.  The following Lifestyle Medicine recommendations according to [place]  [facility]) were discussed and and offered to patient and she  agrees to start the journey:  A. Whole Foods, Plant-Based Nutrition comprising of fruits and vegetables, plant-based proteins, whole-grain carbohydrates was discussed in detail with the patient.   A list for source of those nutrients were also provided to the patient.  Patient will use only water or unsweetened tea for hydration. B.  The need  to stay away from risky substances including alcohol, smoking; obtaining 7 to 9 hours of restorative sleep, at least 150 minutes of moderate intensity exercise weekly, the importance of healthy social connections,  and stress management techniques were discussed. C.  A full color page of  Calorie density of various food groups per pound showing examples of each food groups was provided to the patient.   - she will be scheduled with Jearld Fenton, RDN, CDE for individualized diabetes education.  - I have approached her with the following plan to manage  her diabetes and patient agrees:   -She is advised to continue  Humalog 75/25  80 units with breakfast and 80 units with supper,  only when [place] blood glucose readings are above 90 mg per DL, associated with continuous monitoring of blood glucose using her CGM.   -Because her engagement to lifestyle medicine is suboptimal.  - she is warned not to take insulin without proper monitoring per orders. - Adjustment parameters are given to her for hypo and hyperglycemia in writing. - she is encouraged to call clinic for blood glucose levels less than 70 or above 200 mg  /dl. - she is advised to continue Synjardy to 12.05/998 mg XR p.o. daily at breakfast .    -She appears to be benefiting from [place].  She is advised to continue Mounjaro 10 mg subcutaneously weekly.  She wishes to keep it at her current dose for now.    - Specific targets for  A1c;  LDL, HDL,  and Triglycerides were discussed with the patient.  2) Blood Pressure /Hypertension:   -Her blood pressure is controlled to target.  she is advised to continue her current medications including lisinopril 20 mg p.o. daily with breakfast .  She should be considered for lower dose of lisinopril, 10 mg by her next refill.  3) Lipids/Hyperlipidemia:   Review of her recent lipid panel showed controlled LDL at 81.  This is overall improving over time.  She is advised to continue  pravastatin 40 mg p.o. daily at bedtime. Side effects and precautions discussed with her.  Multiple plant-based diet discussed and recommended above will help with dyslipidemia as well.   4)  Weight/Diet:  Body mass index is 34.02 kg/m.  -   clearly complicating her diabetes care.   she is  a candidate for weight loss. I discussed with her the fact that loss of 5 - 10% of her  current body weight will have the most impact on her diabetes management.  The above detailed  ACLM recommendations for nutrition, exercise, sleep, social life, avoidance of risky substances, the need for restorative sleep   information will also detailed on discharge instructions.   5) abnormal liver ultrasound with history of nonalcoholic fatty liver disease 2 ill-defined areas of lesions in the liver, favored for fatty sparing.  However, since she has established GI care, I asked her to make a phone call and see her GI specialist for better explanation and further evaluation. Options are to repeat ultrasound in 6 months for possible MRI for better characterization of the identified lesions in the liver. She has hypoalbuminemia, elevated PT, will need a better  assessment of her liver function and anatomy.    6) Chronic Care/Health Maintenance:  -she  is on ACEI/ARB and Statin medications and  is encouraged to initiate and continue to follow up with Ophthalmology, Dentist,  Podiatrist at least yearly or according to recommendations, and advised to   stay away  from smoking. I have recommended yearly flu vaccine and pneumonia vaccine at least every 5 years; moderate intensity exercise for up to 150 minutes weekly; and  sleep for 7- 9 hours a day. Her foot exam is normal today. She is advised to discontinue her vitamin B12 supplements.   - she is  advised to maintain close follow up with Donald Prose, MD for primary care needs, as well as her other providers for optimal and coordinated care.  I spent  27 minutes in the care of the patient today including review of labs from [place], Lipids, Thyroid Function, Hematology (current and previous including abstractions from other facilities); face-to-face time discussing  her blood glucose readings/logs, discussing hypoglycemia and hyperglycemia episodes and symptoms, medications doses, her options of short and long term treatment based on the latest standards of care / guidelines;  discussion about incorporating lifestyle medicine;  and documenting the encounter. Risk reduction counseling performed per USPSTF guidelines to reduce obesity and cardiovascular risk factors.     Please refer to Patient Instructions for Blood Glucose Monitoring and Insulin/Medications Dosing Guide"  in media tab for additional information. Please  also refer to " Patient Self Inventory" in the Media  tab for reviewed elements of pertinent patient history.  Kelvin Bustillos [name] participated in the discussions, expressed understanding, and voiced agreement with the above plans.  All questions were answered to her satisfaction. she is encouraged to contact clinic should she have any questions or concerns prior to her return  visit.   Follow up plan: - Return in about 4 months (around 07/30/2022) for Urine MA - NV, A1c -NV.  Glade Lloyd, MD [facility] [address] [place], [place] [address] Phone: [phone]  Fax: [phone]    03/30/2022, 12:22 PM  This note was partially dictated with voice recognition software. Similar sounding words can be transcribed inadequately or may not  be corrected upon review.

## 2022-03-31 ENCOUNTER — Other Ambulatory Visit (HOSPITAL_COMMUNITY): Payer: Self-pay

## 2022-03-31 ENCOUNTER — Other Ambulatory Visit: Payer: Self-pay

## 2022-03-31 ENCOUNTER — Ambulatory Visit: Payer: Commercial Managed Care - PPO | Admitting: Nurse Practitioner

## 2022-04-01 ENCOUNTER — Other Ambulatory Visit (HOSPITAL_COMMUNITY): Payer: Self-pay

## 2022-04-02 ENCOUNTER — Other Ambulatory Visit (HOSPITAL_COMMUNITY): Payer: Self-pay

## 2022-04-06 ENCOUNTER — Other Ambulatory Visit (HOSPITAL_COMMUNITY): Payer: Self-pay

## 2022-04-06 ENCOUNTER — Ambulatory Visit: Payer: Commercial Managed Care - PPO | Admitting: Nurse Practitioner

## 2022-04-13 DIAGNOSIS — I10 Essential (primary) hypertension: Secondary | ICD-10-CM | POA: Diagnosis not present

## 2022-04-13 DIAGNOSIS — F419 Anxiety disorder, unspecified: Secondary | ICD-10-CM | POA: Diagnosis not present

## 2022-04-13 DIAGNOSIS — E782 Mixed hyperlipidemia: Secondary | ICD-10-CM | POA: Diagnosis not present

## 2022-04-13 DIAGNOSIS — F900 Attention-deficit hyperactivity disorder, predominantly inattentive type: Secondary | ICD-10-CM | POA: Diagnosis not present

## 2022-04-13 DIAGNOSIS — E113293 Type 2 diabetes mellitus with mild nonproliferative diabetic retinopathy without macular edema, bilateral: Secondary | ICD-10-CM | POA: Diagnosis not present

## 2022-04-13 DIAGNOSIS — G4700 Insomnia, unspecified: Secondary | ICD-10-CM | POA: Diagnosis not present

## 2022-04-13 DIAGNOSIS — H401111 Primary open-angle glaucoma, right eye, mild stage: Secondary | ICD-10-CM | POA: Diagnosis not present

## 2022-04-20 ENCOUNTER — Other Ambulatory Visit: Payer: Self-pay

## 2022-04-21 ENCOUNTER — Other Ambulatory Visit: Payer: Self-pay

## 2022-04-21 ENCOUNTER — Other Ambulatory Visit (HOSPITAL_COMMUNITY): Payer: Self-pay

## 2022-04-21 ENCOUNTER — Encounter: Payer: Self-pay | Admitting: "Endocrinology

## 2022-04-21 DIAGNOSIS — Z794 Long term (current) use of insulin: Secondary | ICD-10-CM

## 2022-04-21 MED ORDER — HUMALOG MIX 75/25 KWIKPEN (75-25) 100 UNIT/ML ~~LOC~~ SUPN
80.0000 [IU] | PEN_INJECTOR | Freq: Two times a day (BID) | SUBCUTANEOUS | 2 refills | Status: DC
  Filled 2022-04-21: qty 3, 2d supply, fill #0
  Filled 2022-04-23: qty 57, 36d supply, fill #0
  Filled 2022-04-28: qty 30, 28d supply, fill #0
  Filled 2022-05-23: qty 30, 28d supply, fill #1
  Filled 2022-06-24: qty 30, 28d supply, fill #2
  Filled 2022-07-21: qty 30, 28d supply, fill #3
  Filled 2022-08-28: qty 21, 13d supply, fill #4
  Filled 2022-08-28: qty 9, 9d supply, fill #4
  Filled 2022-09-07: qty 30, 28d supply, fill #4

## 2022-04-22 ENCOUNTER — Ambulatory Visit: Payer: Commercial Managed Care - PPO | Admitting: Nurse Practitioner

## 2022-04-22 NOTE — Progress Notes (Deleted)
   Mariah [name] [birth year] [id number]   History:  62 y.o. G0 presents for annual exam without GYN complaints. Postmenopausal - no HRT, no bleeding. Normal pap and mammogram history. HTN, HLD, asthma, and hypothyroidism managed by PCP, T2DM managed by endocrinology. History of vaginal yeast infections s/t diabetic medications. Has not had to use Diflucan much this year.   Gynecologic History No LMP recorded. Patient is postmenopausal.   Contraception/Family planning: post menopausal status Sexually active: No  Health Maintenance Last Pap: 02/07/2019. Results were: Normal neg HPV, 5-year repeat Last mammogram: 01/21/2021. Results were: Normal Last colonoscopy: 2012 Last Dexa: 03/09/2021. Results were: T-score -1.4, FRAX 14 % / 0.5%  Past medical history, past surgical history, family history and social history were all reviewed and documented in the EPIC chart. Married. Works in [employer] for [employer].   ROS:  A ROS was performed and pertinent positives and negatives are included.  Exam:  There were no vitals filed for this visit.   There is no height or weight on file to calculate BMI.  General appearance:  Normal Thyroid:  Symmetrical, normal in size, without palpable masses or nodularity. Respiratory  Auscultation:  Clear without wheezing or rhonchi Cardiovascular  Auscultation:  Regular rate, without rubs, murmurs or gallops  Edema/varicosities:  Not grossly evident Abdominal  Soft,nontender, without masses, guarding or rebound.  Liver/spleen:  No organomegaly noted  Hernia:  None appreciated  Skin  Inspection:  Grossly normal   Breasts: Examined lying and sitting.   Right: Without masses, retractions, discharge or axillary adenopathy.   Left: Without masses, retractions, discharge or axillary adenopathy. Genitourinary   Inguinal/mons:  Normal without inguinal adenopathy  External genitalia:  Normal appearing vulva with no masses, tenderness, or  lesions  BUS/Urethra/Skene's glands:  Normal  Vagina:  Normal appearing with normal color and discharge, no lesions  Cervix:  Normal appearing without discharge or lesions  Uterus:  Difficult to palpate due to body habitus and large hernia but no gross masses or tenderness  Adnexa/parametria:     Rt: Normal in size, without masses or tenderness.   Lt: Normal in size, without masses or tenderness.  Anus and perineum: Normal  Digital rectal exam: Deferred  Patient informed chaperone available to be present for breast and pelvic exam. Patient has requested no chaperone to be present. Patient has been advised what will be completed during breast and pelvic exam.   Assessment/Plan:  62 y.o. G0 for annual exam.   Well female exam with routine gynecological exam - Education provided on SBEs, importance of preventative screenings, current guidelines, high calcium diet, regular exercise, and multivitamin daily. Labs with PCP and endocrinology.   Postmenopausal - denies menopausal symptoms. No HRT, no bleeding.   Osteopenia of neck of right femur - T-score -1.4 without elevated FRAX. Continue Vitamin D + Calcium and increase exercise. Will repeat DXA at 2-year interval per recommendations.   Screening for cervical cancer - Normal Pap history.  Will repeat at 5-year interval per guidelines.  Screening for breast cancer - Normal mammogram history.  Continue annual screenings.  Normal breast exam today.  Screening for colon cancer - 2012 colonoscopy. Overdue and plans to call to schedule.   Return in 1 year for annual.      Olivia Mackie DNP, 7:38 AM 04/22/2022

## 2022-04-23 ENCOUNTER — Other Ambulatory Visit (HOSPITAL_COMMUNITY): Payer: Self-pay

## 2022-04-26 ENCOUNTER — Encounter: Payer: Self-pay | Admitting: Nurse Practitioner

## 2022-04-26 ENCOUNTER — Ambulatory Visit: Payer: Commercial Managed Care - PPO | Admitting: Nurse Practitioner

## 2022-04-28 ENCOUNTER — Other Ambulatory Visit (HOSPITAL_COMMUNITY): Payer: Self-pay

## 2022-04-29 ENCOUNTER — Other Ambulatory Visit (HOSPITAL_COMMUNITY): Payer: Self-pay

## 2022-04-30 ENCOUNTER — Other Ambulatory Visit: Payer: Self-pay

## 2022-04-30 ENCOUNTER — Other Ambulatory Visit (HOSPITAL_COMMUNITY): Payer: Self-pay

## 2022-05-07 ENCOUNTER — Other Ambulatory Visit (HOSPITAL_COMMUNITY): Payer: Self-pay

## 2022-05-08 ENCOUNTER — Other Ambulatory Visit (HOSPITAL_COMMUNITY): Payer: Self-pay

## 2022-05-23 ENCOUNTER — Encounter: Payer: Self-pay | Admitting: "Endocrinology

## 2022-05-24 ENCOUNTER — Other Ambulatory Visit (HOSPITAL_COMMUNITY): Payer: Self-pay

## 2022-05-24 ENCOUNTER — Other Ambulatory Visit: Payer: Self-pay

## 2022-05-26 ENCOUNTER — Other Ambulatory Visit: Payer: Self-pay

## 2022-05-26 ENCOUNTER — Other Ambulatory Visit (HOSPITAL_COMMUNITY): Payer: Self-pay

## 2022-05-26 ENCOUNTER — Other Ambulatory Visit: Payer: Self-pay | Admitting: "Endocrinology

## 2022-05-26 DIAGNOSIS — Z794 Long term (current) use of insulin: Secondary | ICD-10-CM

## 2022-05-26 MED ORDER — SYNJARDY XR 12.5-1000 MG PO TB24
1.0000 | ORAL_TABLET | Freq: Every day | ORAL | 0 refills | Status: DC
Start: 2022-05-26 — End: 2022-07-30
  Filled 2022-05-26: qty 90, 90d supply, fill #0

## 2022-05-26 MED ORDER — SYNJARDY XR 12.5-1000 MG PO TB24
1.0000 | ORAL_TABLET | Freq: Two times a day (BID) | ORAL | 0 refills | Status: DC
  Filled 2022-05-26: qty 180, 90d supply, fill #0

## 2022-05-27 ENCOUNTER — Other Ambulatory Visit (HOSPITAL_BASED_OUTPATIENT_CLINIC_OR_DEPARTMENT_OTHER): Payer: Self-pay

## 2022-05-27 ENCOUNTER — Other Ambulatory Visit (HOSPITAL_COMMUNITY): Payer: Self-pay

## 2022-05-27 DIAGNOSIS — Z1231 Encounter for screening mammogram for malignant neoplasm of breast: Secondary | ICD-10-CM | POA: Diagnosis not present

## 2022-05-28 ENCOUNTER — Encounter: Payer: Self-pay | Admitting: "Endocrinology

## 2022-06-03 DIAGNOSIS — R928 Other abnormal and inconclusive findings on diagnostic imaging of breast: Secondary | ICD-10-CM | POA: Diagnosis not present

## 2022-06-04 ENCOUNTER — Encounter: Payer: Self-pay | Admitting: Nurse Practitioner

## 2022-06-04 ENCOUNTER — Other Ambulatory Visit (HOSPITAL_COMMUNITY): Payer: Self-pay

## 2022-06-07 ENCOUNTER — Other Ambulatory Visit: Payer: Self-pay

## 2022-06-07 ENCOUNTER — Other Ambulatory Visit (HOSPITAL_COMMUNITY): Payer: Self-pay

## 2022-06-16 DIAGNOSIS — H1045 Other chronic allergic conjunctivitis: Secondary | ICD-10-CM | POA: Diagnosis not present

## 2022-06-16 DIAGNOSIS — H35033 Hypertensive retinopathy, bilateral: Secondary | ICD-10-CM | POA: Diagnosis not present

## 2022-06-16 DIAGNOSIS — H02889 Meibomian gland dysfunction of unspecified eye, unspecified eyelid: Secondary | ICD-10-CM | POA: Diagnosis not present

## 2022-06-16 DIAGNOSIS — E119 Type 2 diabetes mellitus without complications: Secondary | ICD-10-CM | POA: Diagnosis not present

## 2022-06-16 DIAGNOSIS — H401131 Primary open-angle glaucoma, bilateral, mild stage: Secondary | ICD-10-CM | POA: Diagnosis not present

## 2022-06-24 ENCOUNTER — Other Ambulatory Visit: Payer: Self-pay

## 2022-06-24 ENCOUNTER — Other Ambulatory Visit: Payer: Self-pay | Admitting: "Endocrinology

## 2022-06-24 ENCOUNTER — Ambulatory Visit: Payer: Commercial Managed Care - PPO | Admitting: Nurse Practitioner

## 2022-06-24 ENCOUNTER — Other Ambulatory Visit (HOSPITAL_COMMUNITY): Payer: Self-pay

## 2022-06-24 DIAGNOSIS — Z794 Long term (current) use of insulin: Secondary | ICD-10-CM

## 2022-06-24 MED ORDER — GLUCAGON EMERGENCY 1 MG IJ KIT
PACK | INTRAMUSCULAR | 0 refills | Status: DC
Start: 2022-06-24 — End: 2022-10-13
  Filled 2022-06-24: qty 1, 30d supply, fill #0

## 2022-06-24 MED ORDER — DULOXETINE HCL 60 MG PO CPEP
60.0000 mg | ORAL_CAPSULE | Freq: Every day | ORAL | 0 refills | Status: AC
  Filled 2022-06-24: qty 90, 90d supply, fill #0

## 2022-06-24 MED ORDER — METHYLPHENIDATE HCL ER (OSM) 27 MG PO TBCR
EXTENDED_RELEASE_TABLET | ORAL | 0 refills | Status: AC
  Filled 2022-08-30: qty 30, 30d supply, fill #0

## 2022-06-24 MED ORDER — METHYLPHENIDATE HCL ER (OSM) 27 MG PO TBCR
27.0000 mg | EXTENDED_RELEASE_TABLET | Freq: Every morning | ORAL | 0 refills | Status: DC
  Filled 2022-07-30 (×2): qty 30, 30d supply, fill #0

## 2022-06-24 MED ORDER — METHYLPHENIDATE HCL ER (OSM) 27 MG PO TBCR
27.0000 mg | EXTENDED_RELEASE_TABLET | Freq: Every morning | ORAL | 0 refills | Status: DC
  Filled 2022-06-24: qty 30, 30d supply, fill #0

## 2022-06-25 ENCOUNTER — Other Ambulatory Visit (HOSPITAL_COMMUNITY): Payer: Self-pay

## 2022-06-28 ENCOUNTER — Other Ambulatory Visit (HOSPITAL_COMMUNITY): Payer: Self-pay

## 2022-06-29 ENCOUNTER — Other Ambulatory Visit (HOSPITAL_COMMUNITY): Payer: Self-pay

## 2022-07-05 ENCOUNTER — Ambulatory Visit (INDEPENDENT_AMBULATORY_CARE_PROVIDER_SITE_OTHER): Payer: Commercial Managed Care - PPO | Admitting: Radiology

## 2022-07-05 ENCOUNTER — Other Ambulatory Visit (HOSPITAL_COMMUNITY): Payer: Self-pay

## 2022-07-05 ENCOUNTER — Encounter: Payer: Self-pay | Admitting: Radiology

## 2022-07-05 VITALS — BP 124/76 | Ht 59.5 in | Wt 174.0 lb

## 2022-07-05 DIAGNOSIS — N761 Subacute and chronic vaginitis: Secondary | ICD-10-CM

## 2022-07-05 DIAGNOSIS — Z01419 Encounter for gynecological examination (general) (routine) without abnormal findings: Secondary | ICD-10-CM

## 2022-07-05 DIAGNOSIS — N958 Other specified menopausal and perimenopausal disorders: Secondary | ICD-10-CM | POA: Diagnosis not present

## 2022-07-05 LAB — WET PREP FOR TRICH, YEAST, CLUE

## 2022-07-05 MED ORDER — ESTRADIOL 0.1 MG/GM VA CREA
1.0000 g | TOPICAL_CREAM | VAGINAL | 12 refills | Status: AC
Start: 2022-07-05 — End: ?
  Filled 2022-07-05: qty 42.5, 90d supply, fill #0
  Filled 2022-07-30: qty 42.5, 147d supply, fill #0
  Filled 2022-07-30: qty 42.5, 90d supply, fill #0

## 2022-07-05 MED ORDER — FLUCONAZOLE 150 MG PO TABS
150.0000 mg | ORAL_TABLET | ORAL | 3 refills | Status: AC
Start: 2022-07-05 — End: ?
  Filled 2022-07-05 – 2022-07-30 (×3): qty 3, 9d supply, fill #0
  Filled 2022-10-13: qty 3, 9d supply, fill #1

## 2022-07-05 NOTE — Progress Notes (Signed)
   Mariah [name] 09/30/1960 [id number]   History: Postmenopausal 62 y.o. presents for annual exam. Hx of osteopenia, taking calcium and vit D, due for repeat DEXA 2025. Thinks she may have a yeast infection, trouble with glucose control causes recurrent infections for her. Moderate vaginal dryness and pain with intercourse.  HTN, HDL, DM managed by PCP and endocrinology. Has lost 11lbs since starting Mounjaro.    Gynecologic History Postmenopausal Last Pap: 2/21. Results were: normal Last mammogram: 5/24. Results were: normal Last colonoscopy: 2014 DEXA:2023 osteopenia HRT use: no  Obstetric History OB History  Gravida Para Term Preterm AB Living  0         0  SAB IAB Ectopic Multiple Live Births                The following portions of the patient's history were reviewed and updated as appropriate: allergies, current medications, past family history, past medical history, past social history, past surgical history, and problem list.  Review of Systems Pertinent items noted in HPI and remainder of comprehensive ROS otherwise negative.  Past medical history, past surgical history, family history and social history were all reviewed and documented in the EPIC chart.  Exam:  Vitals:   07/05/22 1332  BP: 124/76  Weight: 174 lb (78.9 kg)  Height: 4' 11.5" (1.511 m)   Body mass index is 34.56 kg/m.  General appearance:  Normal Thyroid:  Symmetrical, normal in size, without palpable masses or nodularity. Respiratory  Auscultation:  Clear without wheezing or rhonchi Cardiovascular  Auscultation:  Regular rate, without rubs, murmurs or gallops  Edema/varicosities:  Not grossly evident Abdominal  Soft,nontender, without masses, guarding or rebound.  Liver/spleen:  No organomegaly noted  Hernia:  None appreciated  Skin  Inspection:  Grossly normal Breasts: Examined lying and sitting.   Right: Without masses, retractions, nipple discharge or axillary  adenopathy.   Left: Without masses, retractions, nipple discharge or axillary adenopathy. Genitourinary   Inguinal/mons:  Normal without inguinal adenopathy  External genitalia:  Normal appearing vulva with no masses, tenderness, or lesions  BUS/Urethra/Skene's glands:  Normal  Vagina:  Normal appearing with normal color and discharge, no lesions. Atrophy: moderate  Cervix:  Normal appearing without discharge or lesions  Uterus:  Normal in size, shape and contour.  Midline and mobile, nontender  Adnexa/parametria:     Rt: Normal in size, without masses or tenderness.   Lt: Normal in size, without masses or tenderness.  Anus and perineum: Normal    Raynelle Fanning, CMA present for exam  Assessment/Plan:   1. Well woman exam with routine gynecological exam Pap due 2026  2. Subacute vaginitis - WET PREP FOR TRICH, YEAST, CLUE - fluconazole (DIFLUCAN) 150 MG tablet; Take 1 tablet (150 mg total) by mouth every 3 (three) days.  Dispense: 3 tablet; Refill: 3  3. Genitourinary syndrome of menopause - estradiol (ESTRACE VAGINAL) 0.1 MG/GM vaginal cream; Place 1 g vaginally 2 (two) times a week. At bedtime  Dispense: 42.5 g; Refill: 12   Discussed SBE, colonoscopy and DEXA screening as directed. Recommend of exercise weekly, including weight bearing exercise. Encouraged the use of seatbelts and sunscreen.  Return in 1 year for annual or sooner prn.  Lorenda Grecco B WHNP-BC, 1:52 PM 07/05/2022

## 2022-07-14 ENCOUNTER — Telehealth: Payer: Self-pay | Admitting: "Endocrinology

## 2022-07-14 ENCOUNTER — Other Ambulatory Visit (HOSPITAL_COMMUNITY): Payer: Self-pay

## 2022-07-14 ENCOUNTER — Other Ambulatory Visit: Payer: Self-pay | Admitting: Oncology

## 2022-07-14 DIAGNOSIS — Z006 Encounter for examination for normal comparison and control in clinical research program: Secondary | ICD-10-CM

## 2022-07-14 NOTE — Telephone Encounter (Signed)
Pt stated a FMLA form was sent over and pt wanted a call on whether it was received and filled out.

## 2022-07-15 NOTE — Telephone Encounter (Signed)
Left pt msg to call back  

## 2022-07-20 NOTE — Telephone Encounter (Signed)
No answer

## 2022-07-21 ENCOUNTER — Other Ambulatory Visit (HOSPITAL_COMMUNITY): Payer: Self-pay

## 2022-07-21 MED ORDER — GLYCOPYRROLATE 1 MG PO TABS
1.0000 mg | ORAL_TABLET | Freq: Two times a day (BID) | ORAL | 0 refills | Status: DC
  Filled 2022-07-21: qty 180, 90d supply, fill #0

## 2022-07-22 ENCOUNTER — Other Ambulatory Visit: Payer: Self-pay

## 2022-07-22 ENCOUNTER — Other Ambulatory Visit (HOSPITAL_COMMUNITY): Payer: Self-pay

## 2022-07-27 ENCOUNTER — Other Ambulatory Visit (HOSPITAL_COMMUNITY): Payer: Self-pay

## 2022-07-30 ENCOUNTER — Other Ambulatory Visit: Payer: Self-pay | Admitting: "Endocrinology

## 2022-07-30 ENCOUNTER — Other Ambulatory Visit (HOSPITAL_COMMUNITY): Payer: Self-pay

## 2022-07-30 DIAGNOSIS — Z794 Long term (current) use of insulin: Secondary | ICD-10-CM

## 2022-07-31 ENCOUNTER — Other Ambulatory Visit (HOSPITAL_COMMUNITY): Payer: Self-pay

## 2022-08-02 ENCOUNTER — Other Ambulatory Visit (HOSPITAL_COMMUNITY): Payer: Self-pay

## 2022-08-02 MED ORDER — INSULIN PEN NEEDLE 32G X 4 MM MISC
2 refills | Status: DC
  Filled 2022-08-02 – 2022-08-28 (×3): qty 200, 90d supply, fill #0
  Filled 2023-03-14: qty 200, 90d supply, fill #1

## 2022-08-02 MED ORDER — SYNJARDY XR 12.5-1000 MG PO TB24
1.0000 | ORAL_TABLET | Freq: Every day | ORAL | 0 refills | Status: DC
Start: 2022-08-02 — End: 2022-12-27
  Filled 2022-08-02 – 2022-08-28 (×3): qty 90, 90d supply, fill #0

## 2022-08-03 ENCOUNTER — Other Ambulatory Visit (HOSPITAL_COMMUNITY): Payer: Self-pay

## 2022-08-05 ENCOUNTER — Ambulatory Visit: Payer: Commercial Managed Care - PPO | Admitting: "Endocrinology

## 2022-08-06 ENCOUNTER — Other Ambulatory Visit (HOSPITAL_COMMUNITY): Payer: Self-pay

## 2022-08-11 ENCOUNTER — Other Ambulatory Visit (HOSPITAL_COMMUNITY): Payer: Self-pay

## 2022-08-16 ENCOUNTER — Other Ambulatory Visit (HOSPITAL_COMMUNITY): Payer: Self-pay

## 2022-08-28 ENCOUNTER — Other Ambulatory Visit (HOSPITAL_COMMUNITY): Payer: Self-pay

## 2022-08-30 ENCOUNTER — Ambulatory Visit: Payer: Commercial Managed Care - PPO | Admitting: Cardiology

## 2022-08-30 ENCOUNTER — Other Ambulatory Visit: Payer: Self-pay

## 2022-08-30 ENCOUNTER — Other Ambulatory Visit (HOSPITAL_COMMUNITY): Payer: Self-pay

## 2022-08-31 ENCOUNTER — Other Ambulatory Visit (HOSPITAL_COMMUNITY): Payer: Self-pay

## 2022-09-04 ENCOUNTER — Other Ambulatory Visit (HOSPITAL_COMMUNITY): Payer: Self-pay

## 2022-09-07 ENCOUNTER — Other Ambulatory Visit (HOSPITAL_COMMUNITY): Payer: Self-pay

## 2022-09-08 ENCOUNTER — Other Ambulatory Visit (HOSPITAL_COMMUNITY): Payer: Self-pay

## 2022-09-08 ENCOUNTER — Ambulatory Visit (INDEPENDENT_AMBULATORY_CARE_PROVIDER_SITE_OTHER): Payer: Commercial Managed Care - PPO | Admitting: "Endocrinology

## 2022-09-08 ENCOUNTER — Encounter: Payer: Self-pay | Admitting: "Endocrinology

## 2022-09-08 VITALS — BP 116/78 | HR 80 | Ht 59.5 in | Wt 177.8 lb

## 2022-09-08 DIAGNOSIS — Z794 Long term (current) use of insulin: Secondary | ICD-10-CM

## 2022-09-08 DIAGNOSIS — E1169 Type 2 diabetes mellitus with other specified complication: Secondary | ICD-10-CM

## 2022-09-08 DIAGNOSIS — E782 Mixed hyperlipidemia: Secondary | ICD-10-CM | POA: Diagnosis not present

## 2022-09-08 DIAGNOSIS — Z6835 Body mass index (BMI) 35.0-35.9, adult: Secondary | ICD-10-CM

## 2022-09-08 DIAGNOSIS — I10 Essential (primary) hypertension: Secondary | ICD-10-CM | POA: Diagnosis not present

## 2022-09-08 LAB — POCT GLYCOSYLATED HEMOGLOBIN (HGB A1C): HbA1c, POC (controlled diabetic range): 8.8 % — AB (ref 0.0–7.0)

## 2022-09-08 MED ORDER — HUMALOG MIX 75/25 KWIKPEN (75-25) 100 UNIT/ML ~~LOC~~ SUPN
70.0000 [IU] | PEN_INJECTOR | Freq: Two times a day (BID) | SUBCUTANEOUS | 2 refills | Status: DC
Start: 2022-09-08 — End: 2023-01-18

## 2022-09-08 MED ORDER — TIRZEPATIDE 12.5 MG/0.5ML ~~LOC~~ SOAJ
12.5000 mg | SUBCUTANEOUS | 1 refills | Status: DC
  Filled 2022-09-08 – 2022-09-29 (×2): qty 2, 28d supply, fill #0
  Filled 2022-12-10 – 2023-03-14 (×2): qty 2, 28d supply, fill #1
  Filled 2023-04-21 – 2023-04-25 (×2): qty 2, 28d supply, fill #2

## 2022-09-08 NOTE — Patient Instructions (Signed)

## 2022-09-08 NOTE — Progress Notes (Signed)
09/08/2022, 12:22 PM  Endocrinology follow-up note   Subjective:    Patient ID: Mariah [name], female    DOB: 1960-02-26.  Mariah [name] is being seen in follow-up after she was seen in consultation for management of currently uncontrolled symptomatic diabetes requested by  Deatra James, MD.   Past Medical History:  Diagnosis Date   Allergy    Anemia    Anxiety    Arthritis    Asthma    Body odor    from colostomy takedown   Cervicalgia    Depression    Diabetes mellitus without complication (HCC)    Fibroid    GERD (gastroesophageal reflux disease)    Glaucoma    Headache    Heart murmur    as a child   Hyperlipidemia    Hypertension    Hypothyroidism    Infertility    OSA (obstructive sleep apnea)    upper airway resistance syndrome on CPAP at 9cm H2O   Reflux    Vertigo     Past Surgical History:  Procedure Laterality Date   COLECTOMY WITH COLOSTOMY CREATION/HARTMANN PROCEDURE  02/07/2010   Perforated   COLOSTOMY TAKEDOWN  07/28/2010   Lap colostomy takedown   LAPAROTOMY N/A 05/07/2021   Procedure: ABDOMINAL WALL EXPLORATION, PROBABLE REMOVAL OF FOREIGN BODY;  Surgeon: Karie Soda, MD;  Location: [facility];  Service: General;  Laterality: N/A;   TONSILLECTOMY     VENTRAL HERNIA REPAIR  02/07/2010   Primary repair of old hernia    Social History   Socioeconomic History   Marital status: Married    Spouse name: Not on file   Number of children: Not on file   Years of education: Not on file   Highest education level: Not on file  Occupational History   Not on file  Tobacco Use   Smoking status: Former    Current packs/day: 0.00    Types: Cigarettes    Quit date: 11/19/1989    Years since quitting: 32.8   Smokeless tobacco: Never  Vaping Use   Vaping status: Never Used  Substance and Sexual Activity   Alcohol use: Not Currently   Drug use: No   Sexual  activity: Not Currently    Partners: Male    Birth control/protection: Post-menopausal    Comment: 1ST intercourse-17, partners- 3, married- 24 yrs   Other Topics Concern   Not on file  Social History Narrative   Not on file   Social Determinants of Health   Financial Resource Strain: Not on file  Food Insecurity: No Food Insecurity (02/05/2022)   Hunger Vital Sign    Worried About Running Out of Food in the Last Year: Never true    Ran Out of Food in the Last Year: Never true  Transportation Needs: No Transportation Needs (02/05/2022)   PRAPARE - Administrator, Civil Service (Medical): No    Lack of Transportation (Non-Medical): No  Physical Activity: Not on file  Stress: Not on file  Social Connections: Not on file    Family History  Problem Relation Age of Onset   Diabetes Mother    Hypertension Mother    Hyperlipidemia Mother  Diabetes Father    Hypertension Father    Heart attack Father    Diabetes Maternal Grandmother    Hypertension Maternal Grandmother    Heart disease Maternal Grandmother    Cancer Maternal Grandfather        COLON   Breast cancer Paternal Grandmother        Age 36's    Outpatient Encounter Medications as of 09/08/2022  Medication Sig   tirzepatide (MOUNJARO) 12.5 MG/0.5ML Pen Inject 12.5 mg into the skin once a week.   ADVAIR DISKUS 500-50 MCG/ACT AEPB Inhale 1 puff into the lungs in the morning and at bedtime.   albuterol (VENTOLIN HFA) 108 (90 Base) MCG/ACT inhaler Inhale 1 - 2 puffs into the lungs every 6 hours as needed for wheezing or shortness of breath.   aspirin 81 MG EC tablet Take 81 mg by mouth daily.   aspirin-acetaminophen-caffeine (EXCEDRIN MIGRAINE) 250-250-65 MG tablet Take 1 tablet by mouth every 6 (six) hours as needed for headache.   azelastine (ASTELIN) 0.1 % nasal spray Place 1 spray into each nostril 1 - 2 times daily.   Biotin 2500 MCG CAPS Take 2,500 mcg by mouth at bedtime.   brimonidine (ALPHAGAN P) 0.1 %  SOLN Place 1 drop into both eyes 2 (two) times daily.   cetirizine (ZYRTEC) 10 MG tablet Take 1 tablet (10 mg total) by mouth at bedtime.   cholecalciferol (VITAMIN D3) 25 MCG (1000 UNIT) tablet Take 1,000 Units by mouth at bedtime.   Continuous Glucose Sensor (FREESTYLE LIBRE 3 SENSOR) MISC Inject 1 sensor to the skin every 14 days for continuous glucose monitoring as directed.   DULoxetine (CYMBALTA) 60 MG capsule Take 1 capsule (60 mg) by mouth daily.   Empagliflozin-metFORMIN HCl ER (SYNJARDY XR) 12.05-998 MG TB24 Take 1 tablet by mouth daily with breakfast.   EPINEPHrine 0.3 mg/0.3 mL IJ SOAJ injection Inject 0.3 mg into the muscle as needed for anaphylaxis.   escitalopram (LEXAPRO) 20 MG tablet Take 1 tablet every day by oral route.   estradiol (ESTRACE VAGINAL) 0.1 MG/GM vaginal cream Place 1 gram vaginally 2 times a week at bedtime   fluconazole (DIFLUCAN) 150 MG tablet Take 1 tablet (150 mg) by mouth every 3 days.   Glucagon, rDNA, (GLUCAGON EMERGENCY) 1 MG KIT Use as directed.   glycopyrrolate (ROBINUL) 1 MG tablet Take 1 tablet (1 mg total) by mouth 2 (two) times daily.   HUMALOG MIX 75/25 KWIKPEN (75-25) 100 UNIT/ML KwikPen Inject 70 Units into the skin 2 (two) times daily before a meal.   Insulin Pen Needle 31G X 5 MM MISC Use to inject insulin   Insulin Pen Needle 32G X 4 MM MISC Use to inject insulin twice daily   meclizine (ANTIVERT) 25 MG tablet 1 TABLET FOUR TIMES A DAY AS NEEDED ORALLY 5 DAYS   methylphenidate (CONCERTA) 27 MG PO CR tablet Take 1 tablet by mouth once daily in the morning   methylphenidate (CONCERTA) 27 MG PO CR tablet Take 1 tablet (27 mg total) by mouth every morning.   Multiple Vitamins-Minerals (OCUVITE PO) Take 1 tablet by mouth daily.   Niacin (VITAMIN B-3 PO) Take 1 tablet by mouth daily.   olopatadine (PATANOL) 0.1 % ophthalmic solution Place 1 drop into both eyes 2 times daily.   ondansetron (ZOFRAN-ODT) 8 MG disintegrating tablet Place 1 tablet(s)  EVERY 8 HOURS by translingual route for 2 days prn n/v   pantoprazole (PROTONIX) 40 MG tablet Take 1 tablet (40 mg  total) by mouth every morning.   pravastatin (PRAVACHOL) 40 MG tablet Take 1 tablet (40 mg total) by mouth at bedtime.   Probiotic Product (PROBIOTIC DAILY PO) Take 1 capsule by mouth at bedtime.   triamcinolone cream (KENALOG) 0.1 % Apply externally twice a day as needed   vitamin C (ASCORBIC ACID) 250 MG tablet Take 250 mg by mouth at bedtime.   zolpidem (AMBIEN) 10 MG tablet Take 1/2-1 tablet by mouth at bedtime, as needed (30 days)   [DISCONTINUED] HUMALOG MIX 75/25 KWIKPEN (75-25) 100 UNIT/ML KwikPen Inject 80 Units into the skin 2 (two) times daily before a meal.   [DISCONTINUED] Saxagliptin-Metformin (KOMBIGLYZE XR) 2.05-998 MG TB24    [DISCONTINUED] tirzepatide (MOUNJARO) 10 MG/0.5ML Pen Inject 10 mg into the skin once a week.   No facility-administered encounter medications on file as of 09/08/2022.    ALLERGIES: Allergies  Allergen Reactions   Codeine Hives, Swelling and Other (See Comments)   Fluticasone Other (See Comments)    Nose bleeds    Other Other (See Comments)    mushrooms    VACCINATION STATUS: Immunization History  Administered Date(s) Administered   COVID-19, mRNA, vaccine(Comirnaty)12 years and older 03/04/2022   Influenza Inj Mdck Quad Pf 09/29/2018   Influenza,inj,quad, With Preservative 10/05/2018, 11/21/2019   Influenza-Unspecified 09/29/2018, 10/15/2020   Moderna Sars-Covid-2 Vaccination 01/03/2019, 02/03/2019, 09/14/2019   Pfizer Covid-19 Vaccine Bivalent Booster 51yrs & up 11/21/2020   Pneumococcal Polysaccharide-23 07/11/2006    Diabetes She presents for her follow-up diabetic visit. She has type 2 diabetes mellitus. Onset time: She was diagnosed at approximate age of 50 years. Her disease course has been worsening. There are no hypoglycemic associated symptoms. Pertinent negatives for hypoglycemia include no confusion, headaches, pallor  or seizures. Pertinent negatives for diabetes include no chest pain, no fatigue, no polydipsia, no polyphagia and no polyuria. There are no hypoglycemic complications. Symptoms are worsening. (On repeat condition Soliqua obesity, hyperlipidemia, hypertension, MASLD, obstructive sleep apnea.) Risk factors for coronary artery disease include dyslipidemia, diabetes mellitus, hypertension, obesity, family history, tobacco exposure, sedentary lifestyle and post-menopausal. Current diabetic treatments: She is currently on Mounjaro 7.5 mg weekly, Humalog 75/25 70 units a.m., 70 units p.m., Synjardy 12.05/998 mg p.o. once a day at breakfast. Her weight is increasing steadily. She is following a generally unhealthy diet. When asked about meal planning, she reported none. She participates in exercise intermittently. Her home blood glucose trend is decreasing steadily. Her breakfast blood glucose range is generally 140-180 mg/dl. Her lunch blood glucose range is generally 140-180 mg/dl. Her dinner blood glucose range is generally 140-180 mg/dl. Her bedtime blood glucose range is generally 140-180 mg/dl. Her overall blood glucose range is 140-180 mg/dl. (She presents with her CGM device.  More recently, her AGP report shows significant improvement in her glycemic profile averaging 174.  Her AGP shows 57% time in range, 30% 1 hyperglycemia, 12% level 2 hyperglycemia.  Her point-of-care A1c is 8.8%, increasing from her last visit A1c of 8.3% although overall improving from 10.6%.     ) An ACE inhibitor/angiotensin II receptor blocker is being taken.  Hyperlipidemia This is a chronic problem. The current episode started more than 1 year ago. The problem is uncontrolled. Pertinent negatives include no chest pain, myalgias or shortness of breath. Current antihyperlipidemic treatment includes statins. Risk factors for coronary artery disease include dyslipidemia, diabetes mellitus, family history, obesity, hypertension, a  sedentary lifestyle and post-menopausal.  Hypertension This is a chronic problem. The current episode started more than  1 year ago. The problem is controlled. Pertinent negatives include no chest pain, headaches, palpitations or shortness of breath. Risk factors for coronary artery disease include dyslipidemia, diabetes mellitus, obesity, sedentary lifestyle, smoking/tobacco exposure, family history and post-menopausal state. Past treatments include ACE inhibitors.     Review of Systems  Constitutional:  Negative for chills, fatigue, fever and unexpected weight change.  HENT:  Negative for trouble swallowing and voice change.   Eyes:  Negative for visual disturbance.  Respiratory:  Negative for cough, shortness of breath and wheezing.   Cardiovascular:  Negative for chest pain, palpitations and leg swelling.  Gastrointestinal:  Negative for diarrhea, nausea and vomiting.  Endocrine: Negative for cold intolerance, heat intolerance, polydipsia, polyphagia and polyuria.  Musculoskeletal:  Negative for arthralgias and myalgias.  Skin:  Negative for color change, pallor, rash and wound.  Neurological:  Negative for seizures and headaches.  Psychiatric/Behavioral:  Negative for confusion and suicidal ideas.     Objective:       09/08/2022   10:45 AM 07/05/2022    1:32 PM 03/30/2022    8:29 AM  Vitals with BMI  Height 4' 11.5" 4' 11.5" 5\' 0"   Weight 177 lbs 13 oz 174 lbs 174 lbs 3 oz  BMI 35.32 34.57 34.02  Systolic 116 124 161  Diastolic 78 76 70  Pulse 80  88    BP 116/78   Pulse 80   Ht 4' 11.5" (1.511 m)   Wt 177 lb 12.8 oz (80.6 kg)   BMI 35.31 kg/m   Wt Readings from Last 3 Encounters:  09/08/22 177 lb 12.8 oz (80.6 kg)  07/05/22 174 lb (78.9 kg)  03/30/22 174 lb 3.2 oz (79 kg)     Physical Exam    CMP ( most recent) CMP     Component Value Date/Time   NA 137 03/25/2022 0932   K 4.8 03/25/2022 0932   CL 96 03/25/2022 0932   CO2 26 03/25/2022 0932   GLUCOSE 173  (H) 03/25/2022 0932   GLUCOSE 145 (H) 04/30/2021 0855   BUN 13 03/25/2022 0932   CREATININE 1.05 (H) 03/25/2022 0932   CALCIUM 10.6 (H) 03/25/2022 0932   PROT 7.7 03/25/2022 0932   ALBUMIN 4.4 03/25/2022 0932   AST 24 03/25/2022 0932   ALT 18 03/25/2022 0932   ALKPHOS 80 03/25/2022 0932   BILITOT 0.8 03/25/2022 0932   GFRNONAA >60 04/30/2021 0855   GFRAA >60 07/31/2010 0430    Lab Results  Component Value Date   HGBA1C 8.8 (A) 09/08/2022   HGBA1C 8.3 (A) 03/30/2022   HGBA1C 10.6 (A) 11/25/2021    Lipid Panel     Component Value Date/Time   CHOL 161 03/25/2022 0932   TRIG 213 (H) 03/25/2022 0932   HDL 55 03/25/2022 0932   CHOLHDL 2.9 03/25/2022 0932   CHOLHDL 2.0 02/07/2010 0432   VLDL 9 02/07/2010 0432   LDLCALC 71 03/25/2022 0932   LABVLDL 35 03/25/2022 0932      Lab Results  Component Value Date   TSH 1.670 03/25/2022   TSH 0.727 10/11/2017   FREET4 1.12 03/25/2022      Assessment & Plan:   1. Type 2 diabetes mellitus with other specified complication, with long-term current use of insulin (HCC)  - Mariah [name] has currently uncontrolled symptomatic type 2 DM since  62 years of age.  She presents with her CGM device.  More recently, her AGP report shows significant improvement in her glycemic profile averaging  174.  Her AGP shows 57% time in range, 30% 1 hyperglycemia, 12% level 2 hyperglycemia.  Her point-of-care A1c is 8.8%, increasing from her last visit A1c of 8.3% although overall improving from 10.6%.     Recent labs reviewed. - I had a long discussion with her about the progressive nature of diabetes and the pathology behind its complications. -her diabetes is complicated by obesity/sedentary life, polypharmacy, obstructive sleep apnea, comorbid conditions of hyperlipidemia, hypertension and she remains at a high risk for more acute and chronic complications which include CAD, CVA, CKD, retinopathy, and neuropathy. These are all discussed in  detail with her.  - I discussed all available options of managing her diabetes including de-escalation of medications. I have counseled her on diet  and weight management  by adopting a Whole Food , Plant Predominant  ( WFPP) nutrition as recommended by [employer] of Lifestyle Medicine. Patient is encouraged to switch to  unprocessed or minimally processed  complex starch, adequate protein intake (mainly plant source), minimal liquid fat , no Solid fat,  plenty of fruits, and vegetables. -  she is advised to stick to a routine mealtimes to eat 3 complete meals a day and snack only when necessary ( to snack only to correct hypoglycemia BG <70 day time or <100 at night).   He did not engage with lifestyle medicine optimally.  However, she would like to try it again.  - she acknowledges that there is a room for improvement in her food and drink choices. - Suggestion is made for her to avoid simple carbohydrates  from her diet including Cakes, Sweet Desserts, Ice Cream, Soda (diet and regular), Sweet Tea, Candies, Chips, Cookies, Store Bought Juices, Alcohol , Artificial Sweeteners,  Coffee Creamer, and "Sugar-free" Products, Lemonade. This will help patient to have more stable blood glucose profile and potentially avoid unintended weight gain.  The following Lifestyle Medicine recommendations according to American College of Lifestyle Medicine  [facility]) were discussed and and offered to patient and she  agrees to start the journey:  A. Whole Foods, Plant-Based Nutrition comprising of fruits and vegetables, plant-based proteins, whole-grain carbohydrates was discussed in detail with the patient.   A list for source of those nutrients were also provided to the patient.  Patient will use only water or unsweetened tea for hydration. B.  The need to stay away from risky substances including alcohol, smoking; obtaining 7 to 9 hours of restorative sleep, at least 150 minutes of moderate intensity exercise  weekly, the importance of healthy social connections,  and stress management techniques were discussed. C.  A full color page of  Calorie density of various food groups per pound showing examples of each food groups was provided to the patient.     - she will be scheduled with Norm Salt, RDN, CDE for individualized diabetes education.  - I have approached her with the following plan to manage  her diabetes and patient agrees:   -She is advised to lower her Humalog 75/25 to 70 units with breakfast and 70 units with supper only when Premeal blood glucose readings are above 90 mg per DL, associated with continuous monitoring of blood glucose using her CGM.   -Because her engagement to lifestyle medicine is suboptimal.  - she is warned not to take insulin without proper monitoring per orders. - Adjustment parameters are given to her for hypo and hyperglycemia in writing. - she is encouraged to call clinic for blood glucose levels less than 70  or above 200 mg /dl. - she is advised to continue Synjardy to 12.05/998 mg XR p.o. daily at breakfast .    -She has tolerated Mounjaro.  I discussed and increase her dose to 12.5 mg subcutaneously weekly.     - Specific targets for  A1c;  LDL, HDL,  and Triglycerides were discussed with the patient.  2) Blood Pressure /Hypertension:   -Her blood pressure is controlled to target.  she is advised to continue her current medications including lisinopril 20 mg p.o. daily with breakfast .  She should be considered for lower dose of lisinopril, 10 mg by her next refill.  3) Lipids/Hyperlipidemia:   Review of her recent lipid panel showed controlled LDL at 81.  This is overall improving over time.  She is advised to continue pravastatin 40 mg p.o. daily at bedtime.  side effects and precautions discussed with her.  Multiple plant-based diet discussed and recommended above will help with dyslipidemia as well.   4)  Weight/Diet:  Body mass index is 35.31  kg/m.  -   clearly complicating her diabetes care.   she is  a candidate for weight loss. I discussed with her the fact that loss of 5 - 10% of her  current body weight will have the most impact on her diabetes management.  The above detailed  ACLM recommendations for nutrition, exercise, sleep, social life, avoidance of risky substances, the need for restorative sleep   information will also detailed on discharge instructions.   5) abnormal liver ultrasound with history of nonalcoholic fatty liver disease 2 ill-defined areas of lesions in the liver, favored for fatty sparing.  However, since she has established GI care, I asked her to make a phone call and see her GI specialist for better explanation and further evaluation. Options are to repeat ultrasound in 6 months for possible MRI for better characterization of the identified lesions in the liver. She has hypoalbuminemia, elevated PT, will need a better assessment of her liver function and anatomy.    6) Chronic Care/Health Maintenance:  -she  is on ACEI/ARB and Statin medications and  is encouraged to initiate and continue to follow up with Ophthalmology, Dentist,  Podiatrist at least yearly or according to recommendations, and advised to   stay away from smoking. I have recommended yearly flu vaccine and pneumonia vaccine at least every 5 years; moderate intensity exercise for up to 150 minutes weekly; and  sleep for 7- 9 hours a day. Her foot exam is normal today. She is advised to discontinue her vitamin B12 supplements.   - she is  advised to maintain close follow up with Deatra James, MD for primary care needs, as well as her other providers for optimal and coordinated care.  I spent  41  minutes in the care of the patient today including review of labs from CMP, Lipids, Thyroid Function, Hematology (current and previous including abstractions from other facilities); face-to-face time discussing  her blood glucose readings/logs,  discussing hypoglycemia and hyperglycemia episodes and symptoms, medications doses, her options of short and long term treatment based on the latest standards of care / guidelines;  discussion about incorporating lifestyle medicine;  and documenting the encounter. Risk reduction counseling performed per USPSTF guidelines to reduce  obesity and cardiovascular risk factors.     Please refer to Patient Instructions for Blood Glucose Monitoring and Insulin/Medications Dosing Guide"  in media tab for additional information. Please  also refer to " Patient Self Inventory" in the  Media  tab for reviewed elements of pertinent patient history.  Mariah [name] participated in the discussions, expressed understanding, and voiced agreement with the above plans.  All questions were answered to her satisfaction. she is encouraged to contact clinic should she have any questions or concerns prior to her return visit.    Follow up plan: - Return in about 4 months (around 01/08/2023) for F/U with Pre-visit Labs, Meter/CGM/Logs, A1c here.  Marquis Lunch, MD [facility] [address] [place], [place] [address] Phone: [phone]  Fax: [phone]    09/08/2022, 12:22 PM  This note was partially dictated with voice recognition software. Similar sounding words can be transcribed inadequately or may not  be corrected upon review.

## 2022-09-09 ENCOUNTER — Other Ambulatory Visit (HOSPITAL_COMMUNITY): Payer: Self-pay

## 2022-09-15 ENCOUNTER — Encounter: Payer: Self-pay | Admitting: Cardiology

## 2022-09-15 ENCOUNTER — Ambulatory Visit: Payer: Commercial Managed Care - PPO | Admitting: Cardiology

## 2022-09-16 ENCOUNTER — Ambulatory Visit: Payer: Commercial Managed Care - PPO | Attending: Cardiology | Admitting: Cardiology

## 2022-09-16 VITALS — BP 114/74 | HR 96 | Ht 60.0 in | Wt 169.4 lb

## 2022-09-16 DIAGNOSIS — I10 Essential (primary) hypertension: Secondary | ICD-10-CM | POA: Diagnosis not present

## 2022-09-16 DIAGNOSIS — G4733 Obstructive sleep apnea (adult) (pediatric): Secondary | ICD-10-CM | POA: Diagnosis not present

## 2022-09-16 NOTE — Patient Instructions (Signed)

## 2022-09-16 NOTE — Progress Notes (Signed)
Date:  09/16/2022   ID:  Mariah [name], DOB 01/10/1960, MRN [id number]   PCP:  Deatra James, MD  Sleep Medicine:  Armanda Magic, MD Electrophysiologist:  None   Chief Complaint:  OSA  History of Present Illness:    Mariah [name] is a 62 y.o. female with a hx of OSA on PAP, HTN and obesity.  She is doing well with her PAP device but has not been using it a lot lately because she got a new puppy.  She tolerates the full face mask and feels the pressure is adequate.  Since going on PAP she feels rested in the am and has no significant daytime sleepiness.  She denies any significant mouth or nasal dryness or nasal congestion.  She does not think that she snores.  Coronary Ca score in 2018 was 0.  Prior CV studies:   The following studies were reviewed today:  PAP compliance download  Past Medical History:  Diagnosis Date   Allergy    Anemia    Anxiety    Arthritis    Asthma    Body odor    from colostomy takedown   Cervicalgia    Depression    Diabetes mellitus without complication (HCC)    Fibroid    GERD (gastroesophageal reflux disease)    Glaucoma    Headache    Heart murmur    as a child   Hyperlipidemia    Hypertension    Hypothyroidism    Infertility    OSA (obstructive sleep apnea)    upper airway resistance syndrome on CPAP at 9cm H2O   Reflux    Vertigo    Past Surgical History:  Procedure Laterality Date   COLECTOMY WITH COLOSTOMY CREATION/HARTMANN PROCEDURE  02/07/2010   Perforated   COLOSTOMY TAKEDOWN  07/28/2010   Lap colostomy takedown   LAPAROTOMY N/A 05/07/2021   Procedure: ABDOMINAL WALL EXPLORATION, PROBABLE REMOVAL OF FOREIGN BODY;  Surgeon: Karie Soda, MD;  Location: [facility];  Service: General;  Laterality: N/A;   TONSILLECTOMY     VENTRAL HERNIA REPAIR  02/07/2010   Primary repair of old hernia     Current Meds  Medication Sig   ADVAIR DISKUS 500-50 MCG/ACT AEPB Inhale 1 puff into the lungs in the morning and at  bedtime.   albuterol (VENTOLIN HFA) 108 (90 Base) MCG/ACT inhaler Inhale 1 - 2 puffs into the lungs every 6 hours as needed for wheezing or shortness of breath.   aspirin 81 MG EC tablet Take 81 mg by mouth daily.   aspirin-acetaminophen-caffeine (EXCEDRIN MIGRAINE) 250-250-65 MG tablet Take 1 tablet by mouth every 6 (six) hours as needed for headache.   azelastine (ASTELIN) 0.1 % nasal spray Place 1 spray into each nostril 1 - 2 times daily.   Biotin 2500 MCG CAPS Take 2,500 mcg by mouth at bedtime.   brimonidine (ALPHAGAN P) 0.1 % SOLN Place 1 drop into both eyes 2 (two) times daily.   cetirizine (ZYRTEC) 10 MG tablet Take 1 tablet (10 mg total) by mouth at bedtime.   cholecalciferol (VITAMIN D3) 25 MCG (1000 UNIT) tablet Take 1,000 Units by mouth at bedtime.   Continuous Glucose Sensor (FREESTYLE LIBRE 3 SENSOR) MISC Inject 1 sensor to the skin every 14 days for continuous glucose monitoring as directed.   DULoxetine (CYMBALTA) 60 MG capsule Take 1 capsule (60 mg) by mouth daily.   Empagliflozin-metFORMIN HCl ER (SYNJARDY XR) 12.05-998 MG TB24 Take 1 tablet by mouth daily  with breakfast.   EPINEPHrine 0.3 mg/0.3 mL IJ SOAJ injection Inject 0.3 mg into the muscle as needed for anaphylaxis.   escitalopram (LEXAPRO) 20 MG tablet Take 1 tablet every day by oral route.   estradiol (ESTRACE VAGINAL) 0.1 MG/GM vaginal cream Place 1 gram vaginally 2 times a week at bedtime   fluconazole (DIFLUCAN) 150 MG tablet Take 1 tablet (150 mg) by mouth every 3 days.   Glucagon, rDNA, (GLUCAGON EMERGENCY) 1 MG KIT Use as directed.   glycopyrrolate (ROBINUL) 1 MG tablet Take 1 tablet (1 mg total) by mouth 2 (two) times daily.   HUMALOG MIX 75/25 KWIKPEN (75-25) 100 UNIT/ML KwikPen Inject 70 Units into the skin 2 (two) times daily before a meal.   Insulin Pen Needle 31G X 5 MM MISC Use to inject insulin   Insulin Pen Needle 32G X 4 MM MISC Use to inject insulin twice daily   meclizine (ANTIVERT) 25 MG tablet 1  TABLET FOUR TIMES A DAY AS NEEDED ORALLY 5 DAYS   methylphenidate (CONCERTA) 27 MG PO CR tablet Take 1 tablet by mouth once daily in the morning   Multiple Vitamins-Minerals (OCUVITE PO) Take 1 tablet by mouth daily.   Niacin (VITAMIN B-3 PO) Take 1 tablet by mouth daily.   ondansetron (ZOFRAN-ODT) 8 MG disintegrating tablet Place 1 tablet(s) EVERY 8 HOURS by translingual route for 2 days prn n/v   pantoprazole (PROTONIX) 40 MG tablet Take 1 tablet (40 mg total) by mouth every morning.   pravastatin (PRAVACHOL) 40 MG tablet Take 1 tablet (40 mg total) by mouth at bedtime.   Probiotic Product (PROBIOTIC DAILY PO) Take 1 capsule by mouth at bedtime.   tirzepatide (MOUNJARO) 12.5 MG/0.5ML Pen Inject 12.5 mg into the skin once a week.   triamcinolone cream (KENALOG) 0.1 % Apply externally twice a day as needed   vitamin C (ASCORBIC ACID) 250 MG tablet Take 250 mg by mouth at bedtime.   zolpidem (AMBIEN) 10 MG tablet Take 1/2-1 tablet by mouth at bedtime, as needed (30 days)     Allergies:   Codeine, Fluticasone, and Other   Social History   Tobacco Use   Smoking status: Former    Current packs/day: 0.00    Types: Cigarettes    Quit date: 11/19/1989    Years since quitting: 32.8   Smokeless tobacco: Never  Vaping Use   Vaping status: Never Used  Substance Use Topics   Alcohol use: Not Currently   Drug use: No     Family Hx: The patient's family history includes Breast cancer in her paternal grandmother; Cancer in her maternal grandfather; Diabetes in her father, maternal grandmother, and mother; Heart attack in her father; Heart disease in her maternal grandmother; Hyperlipidemia in her mother; Hypertension in her father, maternal grandmother, and mother.  ROS:   Please see the history of present illness.     All other systems reviewed and are negative.   Labs/Other Tests and Data Reviewed:    EKG Interpretation Date/Time:  Thursday September 16 2022 14:06:55 EDT Ventricular  Rate:  98 PR Interval:  132 QRS Duration:  100 QT Interval:  350 QTC Calculation: 446 R Axis:   39  Text Interpretation: Normal sinus rhythm Normal ECG When compared with ECG of 30-Apr-2021 08:53, T wave inversion no longer evident in Inferior leads Confirmed by Armanda Magic [phone]) on 09/16/2022 2:15:37 PM     Recent Labs: 03/25/2022: ALT 18; BUN 13; Creatinine, Ser 1.05; Potassium 4.8; Sodium 137;  TSH 1.670   Recent Lipid Panel Lab Results  Component Value Date/Time   CHOL 161 03/25/2022 09:32 AM   TRIG 213 (H) 03/25/2022 09:32 AM   HDL 55 03/25/2022 09:32 AM   CHOLHDL 2.9 03/25/2022 09:32 AM   CHOLHDL 2.0 02/07/2010 04:32 AM   LDLCALC 71 03/25/2022 09:32 AM    Wt Readings from Last 3 Encounters:  09/16/22 169 lb 6.4 oz (76.8 kg)  09/08/22 177 lb 12.8 oz (80.6 kg)  07/05/22 174 lb (78.9 kg)     Objective:    Vital Signs:  BP 114/74   Pulse 96   Ht 5' (1.524 m)   Wt 169 lb 6.4 oz (76.8 kg)   SpO2 97%   BMI 33.08 kg/m   GEN: Well nourished, well developed in no acute distress HEENT: Normal NECK: No JVD; No carotid bruits LYMPHATICS: No lymphadenopathy CARDIAC:RRR, no murmurs, rubs, gallops RESPIRATORY:  Clear to auscultation without rales, wheezing or rhonchi  ABDOMEN: Soft, non-tender, non-distended MUSCULOSKELETAL:  No edema; No deformity  SKIN: Warm and dry NEUROLOGIC:  Alert and oriented x 3 PSYCHIATRIC:  Normal affect  ASSESSMENT & PLAN:    1. OSA - The patient is tolerating PAP therapy well without any problems. The PAP download performed by his DME was personally reviewed and interpreted by me today and showed an AHI of 1.9 /hr on 9 cm H2O with 20 % compliance in using more than 4 hours nightly.  The patient has been using and benefiting from PAP use and will continue to benefit from therapy.  -I encouraged her to be more compliant with her device   2.  HTN -BP controlled on exam today -Continue drug management with lisinopril 20 mg daily with as needed  refills    Medication Adjustments/Labs and Tests Ordered: Current medicines are reviewed at length with the patient today.  Concerns regarding medicines are outlined above.  Tests Ordered: Orders Placed This Encounter  Procedures   EKG 12-Lead   Medication Changes: No orders of the defined types were placed in this encounter.   Disposition:  Follow up in 1 year(s)  Signed, Armanda Magic, MD  09/16/2022 2:27 PM    [facility]

## 2022-09-17 ENCOUNTER — Other Ambulatory Visit (HOSPITAL_COMMUNITY): Payer: Self-pay

## 2022-09-20 ENCOUNTER — Encounter: Payer: Self-pay | Admitting: "Endocrinology

## 2022-09-27 ENCOUNTER — Other Ambulatory Visit: Payer: Self-pay

## 2022-09-27 ENCOUNTER — Other Ambulatory Visit (HOSPITAL_COMMUNITY): Payer: Self-pay

## 2022-09-27 ENCOUNTER — Other Ambulatory Visit: Payer: Self-pay | Admitting: "Endocrinology

## 2022-09-27 ENCOUNTER — Ambulatory Visit: Payer: Commercial Managed Care - PPO | Admitting: Cardiology

## 2022-09-27 DIAGNOSIS — Z794 Long term (current) use of insulin: Secondary | ICD-10-CM

## 2022-09-28 ENCOUNTER — Other Ambulatory Visit (HOSPITAL_COMMUNITY): Payer: Self-pay

## 2022-09-28 ENCOUNTER — Other Ambulatory Visit (HOSPITAL_BASED_OUTPATIENT_CLINIC_OR_DEPARTMENT_OTHER): Payer: Self-pay

## 2022-09-28 ENCOUNTER — Telehealth: Payer: Self-pay

## 2022-09-28 MED ORDER — HUMALOG MIX 75/25 KWIKPEN (75-25) 100 UNIT/ML ~~LOC~~ SUPN
70.0000 [IU] | PEN_INJECTOR | Freq: Two times a day (BID) | SUBCUTANEOUS | 0 refills | Status: DC
Start: 2022-09-28 — End: 2023-01-18
  Filled 2022-09-28: qty 15, 9d supply, fill #0
  Filled 2022-10-09: qty 75, 47d supply, fill #0
  Filled 2022-10-28: qty 45, 30d supply, fill #1
  Filled 2022-12-27 – 2023-01-01 (×2): qty 45, 30d supply, fill #2

## 2022-09-28 NOTE — Telephone Encounter (Signed)
Pt left a message her glucose had been >200. Stated one of yesterday's readings was 362. Stated at the time she had drank 2 cups of coffee and had eaten an english muffin. States she experiencing increased stress over the past few weeks. Pt's CGM data uploaded and given to Dr.Nida for evaluation. Left a message making pt aware to increase her Humalog 75/25 to 80 units at breakfast and continue the 70 units at supper per Dr.Nida's orders.

## 2022-09-29 ENCOUNTER — Other Ambulatory Visit (HOSPITAL_COMMUNITY): Payer: Self-pay

## 2022-09-30 DIAGNOSIS — H02889 Meibomian gland dysfunction of unspecified eye, unspecified eyelid: Secondary | ICD-10-CM | POA: Diagnosis not present

## 2022-09-30 DIAGNOSIS — H401131 Primary open-angle glaucoma, bilateral, mild stage: Secondary | ICD-10-CM | POA: Diagnosis not present

## 2022-09-30 DIAGNOSIS — H35033 Hypertensive retinopathy, bilateral: Secondary | ICD-10-CM | POA: Diagnosis not present

## 2022-09-30 DIAGNOSIS — E119 Type 2 diabetes mellitus without complications: Secondary | ICD-10-CM | POA: Diagnosis not present

## 2022-10-01 ENCOUNTER — Other Ambulatory Visit (HOSPITAL_COMMUNITY): Payer: Self-pay

## 2022-10-01 DIAGNOSIS — I10 Essential (primary) hypertension: Secondary | ICD-10-CM | POA: Diagnosis not present

## 2022-10-01 DIAGNOSIS — G4700 Insomnia, unspecified: Secondary | ICD-10-CM | POA: Diagnosis not present

## 2022-10-01 DIAGNOSIS — E113293 Type 2 diabetes mellitus with mild nonproliferative diabetic retinopathy without macular edema, bilateral: Secondary | ICD-10-CM | POA: Diagnosis not present

## 2022-10-01 DIAGNOSIS — F419 Anxiety disorder, unspecified: Secondary | ICD-10-CM | POA: Diagnosis not present

## 2022-10-01 DIAGNOSIS — R232 Flushing: Secondary | ICD-10-CM | POA: Diagnosis not present

## 2022-10-01 DIAGNOSIS — Z111 Encounter for screening for respiratory tuberculosis: Secondary | ICD-10-CM | POA: Diagnosis not present

## 2022-10-01 DIAGNOSIS — F900 Attention-deficit hyperactivity disorder, predominantly inattentive type: Secondary | ICD-10-CM | POA: Diagnosis not present

## 2022-10-01 DIAGNOSIS — E782 Mixed hyperlipidemia: Secondary | ICD-10-CM | POA: Diagnosis not present

## 2022-10-01 MED ORDER — METHYLPHENIDATE HCL ER (OSM) 36 MG PO TBCR
36.0000 mg | EXTENDED_RELEASE_TABLET | Freq: Every morning | ORAL | 0 refills | Status: DC
  Filled 2022-10-01: qty 30, 30d supply, fill #0

## 2022-10-02 ENCOUNTER — Other Ambulatory Visit (HOSPITAL_COMMUNITY): Payer: Self-pay

## 2022-10-04 ENCOUNTER — Other Ambulatory Visit (HOSPITAL_COMMUNITY): Payer: Self-pay

## 2022-10-04 MED ORDER — GLYCOPYRROLATE 1 MG PO TABS
1.0000 mg | ORAL_TABLET | Freq: Two times a day (BID) | ORAL | 3 refills | Status: AC
  Filled 2022-10-04: qty 180, 90d supply, fill #0
  Filled 2023-03-14: qty 180, 90d supply, fill #1

## 2022-10-04 MED ORDER — DULOXETINE HCL 60 MG PO CPEP
60.0000 mg | ORAL_CAPSULE | Freq: Every day | ORAL | 3 refills | Status: AC
  Filled 2022-10-04: qty 90, 90d supply, fill #0
  Filled 2023-01-14: qty 90, 90d supply, fill #1
  Filled 2023-04-09 – 2023-04-25 (×3): qty 90, 90d supply, fill #2
  Filled 2023-07-05 – 2023-07-06 (×2): qty 90, 90d supply, fill #3

## 2022-10-04 MED ORDER — LISINOPRIL 20 MG PO TABS
20.0000 mg | ORAL_TABLET | Freq: Every morning | ORAL | 3 refills | Status: AC
  Filled 2022-10-04: qty 90, 90d supply, fill #0
  Filled 2023-01-14: qty 90, 90d supply, fill #1
  Filled 2023-05-10: qty 90, 90d supply, fill #2

## 2022-10-04 MED ORDER — PRAVASTATIN SODIUM 40 MG PO TABS
40.0000 mg | ORAL_TABLET | Freq: Every day | ORAL | 1 refills | Status: DC
  Filled 2022-10-04 – 2023-01-01 (×3): qty 90, 90d supply, fill #0
  Filled 2023-07-05: qty 90, 90d supply, fill #1

## 2022-10-06 DIAGNOSIS — G4733 Obstructive sleep apnea (adult) (pediatric): Secondary | ICD-10-CM | POA: Diagnosis not present

## 2022-10-09 ENCOUNTER — Other Ambulatory Visit (HOSPITAL_COMMUNITY): Payer: Self-pay

## 2022-10-11 ENCOUNTER — Other Ambulatory Visit: Payer: Self-pay

## 2022-10-11 ENCOUNTER — Other Ambulatory Visit (HOSPITAL_COMMUNITY): Payer: Self-pay

## 2022-10-13 ENCOUNTER — Other Ambulatory Visit: Payer: Self-pay | Admitting: "Endocrinology

## 2022-10-13 ENCOUNTER — Other Ambulatory Visit: Payer: Self-pay

## 2022-10-13 ENCOUNTER — Other Ambulatory Visit (HOSPITAL_COMMUNITY): Payer: Self-pay

## 2022-10-13 ENCOUNTER — Encounter: Payer: Self-pay | Admitting: "Endocrinology

## 2022-10-13 DIAGNOSIS — E1169 Type 2 diabetes mellitus with other specified complication: Secondary | ICD-10-CM

## 2022-10-14 ENCOUNTER — Other Ambulatory Visit (HOSPITAL_COMMUNITY): Payer: Self-pay

## 2022-10-14 ENCOUNTER — Telehealth: Payer: Self-pay | Admitting: "Endocrinology

## 2022-10-14 ENCOUNTER — Telehealth: Payer: Self-pay

## 2022-10-14 MED ORDER — GLUCAGON EMERGENCY 1 MG IJ KIT
PACK | INTRAMUSCULAR | 0 refills | Status: DC
  Filled 2022-10-14: qty 1, 30d supply, fill #0

## 2022-10-14 NOTE — Telephone Encounter (Signed)
Prior Authorization was sent on 10/13/22 to [employer] for pts Humalog 75/25 kwikpen. This is a quantity Exception authorization. Checked on the status today. No update. Notified it can take 24-72hrs for insurance to give an approval/denial.

## 2022-10-14 NOTE — Telephone Encounter (Signed)
Routing to provider for final review and closing encounter.

## 2022-10-14 NOTE — Telephone Encounter (Signed)
Appointment Request From: Roselie Awkward Comma-Watson   With Provider: [facility]]   Preferred Date Range: Any date 10/13/2022 or later   Preferred Times: Any   Reason: To address the following health maintenance concerns. Diabetic Kidney Evaluation - Urine Acr   Comments: Can you send a lab request to [facility]?

## 2022-10-21 ENCOUNTER — Other Ambulatory Visit (HOSPITAL_COMMUNITY): Payer: Self-pay

## 2022-10-22 ENCOUNTER — Other Ambulatory Visit: Payer: Self-pay

## 2022-10-22 ENCOUNTER — Other Ambulatory Visit (HOSPITAL_COMMUNITY): Payer: Self-pay

## 2022-10-25 ENCOUNTER — Other Ambulatory Visit: Payer: Self-pay

## 2022-10-28 ENCOUNTER — Other Ambulatory Visit: Payer: Self-pay

## 2022-10-28 ENCOUNTER — Other Ambulatory Visit (HOSPITAL_COMMUNITY): Payer: Self-pay

## 2022-10-29 ENCOUNTER — Other Ambulatory Visit: Payer: Self-pay

## 2022-10-29 MED ORDER — COVID-19 MRNA VAC-TRIS(PFIZER) 30 MCG/0.3ML IM SUSY
0.3000 mL | PREFILLED_SYRINGE | Freq: Once | INTRAMUSCULAR | 0 refills | Status: AC
Start: 2022-10-29 — End: 2022-11-04
  Filled 2022-10-29: qty 0.3, 1d supply, fill #0

## 2022-11-03 ENCOUNTER — Other Ambulatory Visit: Payer: Self-pay

## 2022-11-05 ENCOUNTER — Other Ambulatory Visit (HOSPITAL_COMMUNITY): Payer: Self-pay

## 2022-11-05 MED ORDER — METHYLPHENIDATE HCL ER (OSM) 36 MG PO TBCR
36.0000 mg | EXTENDED_RELEASE_TABLET | Freq: Every morning | ORAL | 0 refills | Status: AC
  Filled 2022-11-05 – 2023-03-14 (×2): qty 30, 30d supply, fill #0

## 2022-11-05 MED ORDER — METHYLPHENIDATE HCL ER (OSM) 36 MG PO TBCR
36.0000 mg | EXTENDED_RELEASE_TABLET | Freq: Every morning | ORAL | 0 refills | Status: AC
  Filled 2022-11-05 – 2022-11-17 (×2): qty 30, 30d supply, fill #0

## 2022-11-05 MED ORDER — METHYLPHENIDATE HCL ER (OSM) 36 MG PO TBCR
36.0000 mg | EXTENDED_RELEASE_TABLET | Freq: Every morning | ORAL | 0 refills | Status: DC
Start: 2022-11-05 — End: 2023-02-09
  Filled 2022-11-05 – 2023-01-01 (×4): qty 30, 30d supply, fill #0

## 2022-11-09 ENCOUNTER — Other Ambulatory Visit: Payer: Self-pay

## 2022-11-15 ENCOUNTER — Other Ambulatory Visit (HOSPITAL_COMMUNITY): Payer: Self-pay

## 2022-11-16 ENCOUNTER — Other Ambulatory Visit (HOSPITAL_BASED_OUTPATIENT_CLINIC_OR_DEPARTMENT_OTHER): Payer: Self-pay

## 2022-11-17 ENCOUNTER — Other Ambulatory Visit: Payer: Self-pay

## 2022-11-17 ENCOUNTER — Other Ambulatory Visit (HOSPITAL_COMMUNITY): Payer: Self-pay

## 2022-11-17 DIAGNOSIS — R051 Acute cough: Secondary | ICD-10-CM | POA: Diagnosis not present

## 2022-11-17 DIAGNOSIS — R5383 Other fatigue: Secondary | ICD-10-CM | POA: Diagnosis not present

## 2022-11-17 DIAGNOSIS — U071 COVID-19: Secondary | ICD-10-CM | POA: Diagnosis not present

## 2022-11-17 DIAGNOSIS — R509 Fever, unspecified: Secondary | ICD-10-CM | POA: Diagnosis not present

## 2022-11-17 DIAGNOSIS — R52 Pain, unspecified: Secondary | ICD-10-CM | POA: Diagnosis not present

## 2022-11-17 MED ORDER — PAXLOVID (300/100) 20 X 150 MG & 10 X 100MG PO TBPK
3.0000 | ORAL_TABLET | Freq: Two times a day (BID) | ORAL | 0 refills | Status: AC
  Filled 2022-11-17: qty 30, 5d supply, fill #0

## 2022-11-18 ENCOUNTER — Other Ambulatory Visit (HOSPITAL_COMMUNITY): Payer: Self-pay

## 2022-11-18 ENCOUNTER — Other Ambulatory Visit: Payer: Self-pay

## 2022-11-18 ENCOUNTER — Other Ambulatory Visit: Payer: Self-pay | Admitting: Allergy and Immunology

## 2022-11-18 MED ORDER — CETIRIZINE HCL 10 MG PO TABS
10.0000 mg | ORAL_TABLET | Freq: Every evening | ORAL | 5 refills | Status: DC | PRN
  Filled 2022-11-18 – 2022-12-10 (×2): qty 30, 30d supply, fill #0
  Filled 2023-02-09 (×2): qty 30, 30d supply, fill #1
  Filled 2023-03-14: qty 30, 30d supply, fill #2
  Filled 2023-04-09 – 2023-04-25 (×3): qty 30, 30d supply, fill #3
  Filled 2023-05-21 – 2023-06-01 (×3): qty 30, 30d supply, fill #4
  Filled 2023-07-01: qty 30, 30d supply, fill #5

## 2022-11-23 ENCOUNTER — Other Ambulatory Visit: Payer: Self-pay

## 2022-11-24 ENCOUNTER — Other Ambulatory Visit (HOSPITAL_COMMUNITY): Payer: Self-pay

## 2022-12-10 ENCOUNTER — Other Ambulatory Visit (HOSPITAL_COMMUNITY): Payer: Self-pay

## 2022-12-11 ENCOUNTER — Other Ambulatory Visit (HOSPITAL_COMMUNITY): Payer: Self-pay

## 2022-12-17 ENCOUNTER — Other Ambulatory Visit: Payer: Self-pay

## 2022-12-17 ENCOUNTER — Other Ambulatory Visit (HOSPITAL_COMMUNITY): Payer: Self-pay

## 2022-12-22 ENCOUNTER — Other Ambulatory Visit: Payer: Self-pay

## 2022-12-27 ENCOUNTER — Other Ambulatory Visit: Payer: Self-pay

## 2022-12-27 ENCOUNTER — Other Ambulatory Visit (HOSPITAL_COMMUNITY): Payer: Self-pay

## 2022-12-27 ENCOUNTER — Other Ambulatory Visit: Payer: Self-pay | Admitting: "Endocrinology

## 2022-12-27 DIAGNOSIS — Z794 Long term (current) use of insulin: Secondary | ICD-10-CM

## 2022-12-27 MED ORDER — SYNJARDY XR 12.5-1000 MG PO TB24
1.0000 | ORAL_TABLET | Freq: Every day | ORAL | 0 refills | Status: DC
  Filled 2022-12-27 – 2023-01-01 (×2): qty 90, 90d supply, fill #0

## 2022-12-28 ENCOUNTER — Other Ambulatory Visit: Payer: Self-pay

## 2022-12-30 ENCOUNTER — Encounter: Payer: Self-pay | Admitting: Pharmacist

## 2022-12-30 ENCOUNTER — Other Ambulatory Visit: Payer: Self-pay

## 2023-01-01 ENCOUNTER — Other Ambulatory Visit (HOSPITAL_COMMUNITY): Payer: Self-pay

## 2023-01-03 ENCOUNTER — Other Ambulatory Visit: Payer: Self-pay

## 2023-01-04 DIAGNOSIS — G4733 Obstructive sleep apnea (adult) (pediatric): Secondary | ICD-10-CM | POA: Diagnosis not present

## 2023-01-08 ENCOUNTER — Other Ambulatory Visit (HOSPITAL_COMMUNITY): Payer: Self-pay

## 2023-01-13 ENCOUNTER — Other Ambulatory Visit (HOSPITAL_COMMUNITY): Payer: Self-pay

## 2023-01-14 ENCOUNTER — Other Ambulatory Visit (HOSPITAL_COMMUNITY): Payer: Self-pay

## 2023-01-14 DIAGNOSIS — Z794 Long term (current) use of insulin: Secondary | ICD-10-CM | POA: Diagnosis not present

## 2023-01-14 DIAGNOSIS — E1169 Type 2 diabetes mellitus with other specified complication: Secondary | ICD-10-CM | POA: Diagnosis not present

## 2023-01-15 LAB — LIPID PANEL
Chol/HDL Ratio: 2.8 {ratio} (ref 0.0–4.4)
Cholesterol, Total: 148 mg/dL (ref 100–199)
HDL: 52 mg/dL (ref >39)
LDL Chol Calc (NIH): 65 mg/dL (ref 0–99)
Triglycerides: 190 mg/dL — ABNORMAL HIGH (ref 0–149)
VLDL Cholesterol Cal: 31 mg/dL (ref 5–40)

## 2023-01-15 LAB — COMPREHENSIVE METABOLIC PANEL
ALT: 15 [IU]/L (ref 0–32)
AST: 18 [IU]/L (ref 0–40)
Albumin: 4.3 g/dL (ref 3.9–4.9)
Alkaline Phosphatase: 77 [IU]/L (ref 44–121)
BUN/Creatinine Ratio: 18 (ref 12–28)
BUN: 18 mg/dL (ref 8–27)
Bilirubin Total: 0.7 mg/dL (ref 0.0–1.2)
CO2: 25 mmol/L (ref 20–29)
Calcium: 9.9 mg/dL (ref 8.7–10.3)
Chloride: 100 mmol/L (ref 96–106)
Creatinine, Ser: 1.02 mg/dL — ABNORMAL HIGH (ref 0.57–1.00)
Globulin, Total: 3.2 g/dL (ref 1.5–4.5)
Glucose: 64 mg/dL — ABNORMAL LOW (ref 70–99)
Potassium: 4.5 mmol/L (ref 3.5–5.2)
Sodium: 141 mmol/L (ref 134–144)
Total Protein: 7.5 g/dL (ref 6.0–8.5)
eGFR: 62 mL/min/{1.73_m2} (ref >59)

## 2023-01-18 ENCOUNTER — Encounter: Payer: Self-pay | Admitting: "Endocrinology

## 2023-01-18 ENCOUNTER — Ambulatory Visit (INDEPENDENT_AMBULATORY_CARE_PROVIDER_SITE_OTHER): Payer: Commercial Managed Care - PPO | Admitting: "Endocrinology

## 2023-01-18 VITALS — BP 124/78 | HR 92 | Ht 60.0 in | Wt 167.4 lb

## 2023-01-18 DIAGNOSIS — E6609 Other obesity due to excess calories: Secondary | ICD-10-CM

## 2023-01-18 DIAGNOSIS — E782 Mixed hyperlipidemia: Secondary | ICD-10-CM

## 2023-01-18 DIAGNOSIS — E66811 Obesity, class 1: Secondary | ICD-10-CM | POA: Insufficient documentation

## 2023-01-18 DIAGNOSIS — Z6832 Body mass index (BMI) 32.0-32.9, adult: Secondary | ICD-10-CM | POA: Diagnosis not present

## 2023-01-18 DIAGNOSIS — Z794 Long term (current) use of insulin: Secondary | ICD-10-CM | POA: Insufficient documentation

## 2023-01-18 DIAGNOSIS — E1169 Type 2 diabetes mellitus with other specified complication: Secondary | ICD-10-CM | POA: Diagnosis not present

## 2023-01-18 DIAGNOSIS — I10 Essential (primary) hypertension: Secondary | ICD-10-CM

## 2023-01-18 LAB — POCT GLYCOSYLATED HEMOGLOBIN (HGB A1C): HbA1c, POC (controlled diabetic range): 9.1 % — AB (ref 0.0–7.0)

## 2023-01-18 MED ORDER — HUMALOG MIX 75/25 KWIKPEN (75-25) 100 UNIT/ML ~~LOC~~ SUPN: 80.0000 [IU] | PEN_INJECTOR | Freq: Two times a day (BID) | SUBCUTANEOUS | 2 refills | Status: DC

## 2023-01-18 NOTE — Patient Instructions (Signed)

## 2023-01-18 NOTE — Progress Notes (Signed)
 01/18/2023, 9:20 AM  Endocrinology follow-up note   Subjective:    Patient ID: Mariah [name], female    DOB: 08-08-1960.  Mariah [name] is being seen in follow-up after she was seen in consultation for management of currently uncontrolled symptomatic diabetes requested by  Sun, Vyvyan, MD.   Past Medical History:  Diagnosis Date   Allergy    Anemia    Anxiety    Arthritis    Asthma    Body odor    from colostomy takedown   Cervicalgia    Depression    Diabetes mellitus without complication (HCC)    Fibroid    GERD (gastroesophageal reflux disease)    Glaucoma    Headache    Heart murmur    as a child   Hyperlipidemia    Hypertension    Hypothyroidism    Infertility    OSA (obstructive sleep apnea)    upper airway resistance syndrome on CPAP at 9cm H2O   Reflux    Vertigo     Past Surgical History:  Procedure Laterality Date   COLECTOMY WITH COLOSTOMY CREATION/HARTMANN PROCEDURE  02/07/2010   Perforated   COLOSTOMY TAKEDOWN  07/28/2010   Lap colostomy takedown   LAPAROTOMY N/A 05/07/2021   Procedure: ABDOMINAL WALL EXPLORATION, PROBABLE REMOVAL OF FOREIGN BODY;  Surgeon: Sheldon Standing, MD;  Location: [facility];  Service: General;  Laterality: N/A;   TONSILLECTOMY     VENTRAL HERNIA REPAIR  02/07/2010   Primary repair of old hernia    Social History   Socioeconomic History   Marital status: Married    Spouse name: Not on file   Number of children: Not on file   Years of education: Not on file   Highest education level: Not on file  Occupational History   Not on file  Tobacco Use   Smoking status: Former    Current packs/day: 0.00    Types: Cigarettes    Quit date: 11/19/1989    Years since quitting: 33.1   Smokeless tobacco: Never  Vaping Use   Vaping status: Never Used  Substance and Sexual Activity   Alcohol use: Not Currently   Drug use: No   Sexual  activity: Not Currently    Partners: Male    Birth control/protection: Post-menopausal    Comment: 1ST intercourse-17, partners- 3, married- 24 yrs   Other Topics Concern   Not on file  Social History Narrative   Not on file   Social Drivers of Health   Financial Resource Strain: Not on file  Food Insecurity: No Food Insecurity (02/05/2022)   Hunger Vital Sign    Worried About Running Out of Food in the Last Year: Never true    Ran Out of Food in the Last Year: Never true  Transportation Needs: No Transportation Needs (02/05/2022)   PRAPARE - Administrator, Civil Service (Medical): No    Lack of Transportation (Non-Medical): No  Physical Activity: Not on file  Stress: Not on file  Social Connections: Not on file    Family History  Problem Relation Age of Onset   Diabetes Mother    Hypertension Mother    Hyperlipidemia Mother  Diabetes Father    Hypertension Father    Heart attack Father    Diabetes Maternal Grandmother    Hypertension Maternal Grandmother    Heart disease Maternal Grandmother    Cancer Maternal Grandfather        COLON   Breast cancer Paternal Grandmother        Age 75's    Outpatient Encounter Medications as of 01/18/2023  Medication Sig   ADVAIR  DISKUS 500-50 MCG/ACT AEPB Inhale 1 puff into the lungs in the morning and at bedtime.   albuterol  (VENTOLIN  HFA) 108 (90 Base) MCG/ACT inhaler Inhale 1 - 2 puffs into the lungs every 6 hours as needed for wheezing or shortness of breath.   aspirin 81 MG EC tablet Take 81 mg by mouth daily.   aspirin-acetaminophen -caffeine (EXCEDRIN MIGRAINE) 250-250-65 MG tablet Take 1 tablet by mouth every 6 (six) hours as needed for headache.   azelastine  (ASTELIN ) 0.1 % nasal spray Place 1 spray into each nostril 1 - 2 times daily.   Biotin 2500 MCG CAPS Take 2,500 mcg by mouth at bedtime.   brimonidine  (ALPHAGAN  P) 0.1 % SOLN Place 1 drop into both eyes 2 (two) times daily.   cetirizine  (ZYRTEC  ALLERGY) 10 MG  tablet Take 1 tablet (10 mg total) by mouth at bedtime as needed for allergies or itching   cetirizine  (ZYRTEC ) 10 MG tablet Take 1 tablet (10 mg total) by mouth at bedtime.   cholecalciferol (VITAMIN D3) 25 MCG (1000 UNIT) tablet Take 1,000 Units by mouth at bedtime.   Continuous Glucose Sensor (FREESTYLE LIBRE 3 SENSOR) MISC Inject 1 sensor to the skin every 14 days for continuous glucose monitoring as directed.   DULoxetine  (CYMBALTA ) 60 MG capsule Take 1 capsule (60 mg) by mouth daily.   DULoxetine  (CYMBALTA ) 60 MG capsule Take 1 capsule (60 mg total) by mouth daily.   Empagliflozin -metFORMIN  HCl ER (SYNJARDY  XR) 12.05-998 MG TB24 Take 1 tablet by mouth daily with breakfast.   EPINEPHrine  0.3 mg/0.3 mL IJ SOAJ injection Inject 0.3 mg into the muscle as needed for anaphylaxis.   escitalopram (LEXAPRO) 20 MG tablet Take 1 tablet every day by oral route.   estradiol  (ESTRACE  VAGINAL) 0.1 MG/GM vaginal cream Place 1 gram vaginally 2 times a week at bedtime   fluconazole  (DIFLUCAN ) 150 MG tablet Take 1 tablet (150 mg) by mouth every 3 days.   Glucagon , rDNA, (GLUCAGON  EMERGENCY) 1 MG KIT Use as directed.   glycopyrrolate  (ROBINUL ) 1 MG tablet Take 1 tablet (1 mg total) by mouth 2 (two) times daily.   HUMALOG  MIX 75/25 KWIKPEN (75-25) 100 UNIT/ML KwikPen Inject 80 Units into the skin 2 (two) times daily before a meal.   Insulin  Pen Needle 31G X 5 MM MISC Use to inject insulin    Insulin  Pen Needle 32G X 4 MM MISC Use to inject insulin  twice daily   lisinopril  (ZESTRIL ) 20 MG tablet Take 1 tablet (20 mg total) by mouth every morning.   meclizine (ANTIVERT) 25 MG tablet 1 TABLET FOUR TIMES A DAY AS NEEDED ORALLY 5 DAYS   methylphenidate  (CONCERTA ) 27 MG PO CR tablet Take 1 tablet by mouth once daily in the morning   methylphenidate  (CONCERTA ) 36 MG PO CR tablet Take 1 tablet by mouth in the morning --may fill 30 days after last prescription Once a day   methylphenidate  (CONCERTA ) 36 MG PO CR tablet  Take 1 tablet by mouth in the morning   methylphenidate  (CONCERTA ) 36 MG PO CR tablet  Take 1 tablet by mouth in the morning --may fill 30 days after last prescription   Multiple Vitamins-Minerals (OCUVITE PO) Take 1 tablet by mouth daily.   Niacin (VITAMIN B-3 PO) Take 1 tablet by mouth daily.   ondansetron  (ZOFRAN -ODT) 8 MG disintegrating tablet Place 1 tablet(s) EVERY 8 HOURS by translingual route for 2 days prn n/v   pantoprazole  (PROTONIX ) 40 MG tablet Take 1 tablet (40 mg total) by mouth every morning.   pravastatin  (PRAVACHOL ) 40 MG tablet Take 1 tablet (40 mg total) by mouth at bedtime.   pravastatin  (PRAVACHOL ) 40 MG tablet Take 1 tablet (40 mg total) by mouth at bedtime.   Probiotic Product (PROBIOTIC DAILY PO) Take 1 capsule by mouth at bedtime.   tirzepatide  (MOUNJARO ) 12.5 MG/0.5ML Pen Inject 12.5 mg into the skin once a week.   triamcinolone  cream (KENALOG ) 0.1 % Apply externally twice a day as needed   vitamin C (ASCORBIC ACID) 250 MG tablet Take 250 mg by mouth at bedtime.   zolpidem  (AMBIEN ) 10 MG tablet Take 1/2-1 tablet by mouth at bedtime, as needed (30 days)   [DISCONTINUED] HUMALOG  MIX 75/25 KWIKPEN (75-25) 100 UNIT/ML KwikPen Inject 70 Units into the skin 2 (two) times daily before a meal.   [DISCONTINUED] HUMALOG  MIX 75/25 KWIKPEN (75-25) 100 UNIT/ML KwikPen Inject 70-80 Units into the skin 2 (two) times daily before a meal. Inject 80 units into the skin before breakfast and 70 units before supper.   No facility-administered encounter medications on file as of 01/18/2023.    ALLERGIES: Allergies  Allergen Reactions   Codeine Hives, Swelling and Other (See Comments)   Fluticasone  Other (See Comments)    Nose bleeds    Other Other (See Comments)    mushrooms    VACCINATION STATUS: Immunization History  Administered Date(s) Administered   Influenza Inj Mdck Quad Pf 09/29/2018   Influenza,inj,quad, With Preservative 10/05/2018, 11/21/2019   Influenza-Unspecified  09/29/2018, 10/15/2020   Moderna Sars-Covid-2 Vaccination 01/03/2019, 02/03/2019   Pfizer Covid-19 Vaccine Bivalent Booster 70yrs & up 11/21/2020   Pfizer(Comirnaty )Fall Seasonal Vaccine 12 years and older 03/04/2022, 11/03/2022   Pneumococcal Polysaccharide-23 07/11/2006    Diabetes She presents for her follow-up diabetic visit. She has type 2 diabetes mellitus. Onset time: She was diagnosed at approximate age of 50 years. Her disease course has been worsening. There are no hypoglycemic associated symptoms. Pertinent negatives for hypoglycemia include no confusion, headaches, pallor or seizures. Pertinent negatives for diabetes include no chest pain, no fatigue, no polydipsia, no polyphagia and no polyuria. There are no hypoglycemic complications. Symptoms are worsening. (On repeat condition Soliqua obesity, hyperlipidemia, hypertension, MASLD, obstructive sleep apnea.) Risk factors for coronary artery disease include dyslipidemia, diabetes mellitus, hypertension, obesity, family history, tobacco exposure, sedentary lifestyle and post-menopausal. Current diabetic treatments: She is currently on Mounjaro  7.5 mg weekly, Humalog  75/25 70 units a.m., 70 units p.m., Synjardy  12.05/998 mg p.o. once a day at breakfast. Her weight is decreasing steadily. She is following a generally unhealthy diet. When asked about meal planning, she reported none. She participates in exercise intermittently. Her home blood glucose trend is increasing steadily. Her breakfast blood glucose range is generally >200 mg/dl. Her lunch blood glucose range is generally >200 mg/dl. Her dinner blood glucose range is generally >200 mg/dl. Her bedtime blood glucose range is generally >200 mg/dl. Her overall blood glucose range is >200 mg/dl. (She presents with her CGM device.  More recently, her AGP report shows loss of control in her glycemic profile.  Her AGP report shows 46% time in range, 23% level 1 hyperglycemia, 31% level 2  hyperglycemia.  She has no hypoglycemia.  Her point-of-care A1c is 9.1% increasing from 8.1%.      ) An ACE inhibitor/angiotensin II receptor blocker is being taken.  Hyperlipidemia This is a chronic problem. The current episode started more than 1 year ago. The problem is uncontrolled. Pertinent negatives include no chest pain, myalgias or shortness of breath. Current antihyperlipidemic treatment includes statins. Risk factors for coronary artery disease include dyslipidemia, diabetes mellitus, family history, obesity, hypertension, a sedentary lifestyle and post-menopausal.  Hypertension This is a chronic problem. The current episode started more than 1 year ago. The problem is controlled. Pertinent negatives include no chest pain, headaches, palpitations or shortness of breath. Risk factors for coronary artery disease include dyslipidemia, diabetes mellitus, obesity, sedentary lifestyle, smoking/tobacco exposure, family history and post-menopausal state. Past treatments include ACE inhibitors.     Review of Systems  Constitutional:  Negative for chills, fatigue, fever and unexpected weight change.  HENT:  Negative for trouble swallowing and voice change.   Eyes:  Negative for visual disturbance.  Respiratory:  Negative for cough, shortness of breath and wheezing.   Cardiovascular:  Negative for chest pain, palpitations and leg swelling.  Gastrointestinal:  Negative for diarrhea, nausea and vomiting.  Endocrine: Negative for cold intolerance, heat intolerance, polydipsia, polyphagia and polyuria.  Musculoskeletal:  Negative for arthralgias and myalgias.  Skin:  Negative for color change, pallor, rash and wound.  Neurological:  Negative for seizures and headaches.  Psychiatric/Behavioral:  Negative for confusion and suicidal ideas.     Objective:       01/18/2023    8:52 AM 09/16/2022    2:11 PM 09/08/2022   10:45 AM  Vitals with BMI  Height 5' 0 5' 0 4' 11.5  Weight 167 lbs 6 oz  169 lbs 6 oz 177 lbs 13 oz  BMI 32.69 33.08 35.32  Systolic 124 114 883  Diastolic 78 74 78  Pulse 92 96 80    BP 124/78   Pulse 92   Ht 5' (1.524 m)   Wt 167 lb 6.4 oz (75.9 kg)   BMI 32.69 kg/m   Wt Readings from Last 3 Encounters:  01/18/23 167 lb 6.4 oz (75.9 kg)  09/16/22 169 lb 6.4 oz (76.8 kg)  09/08/22 177 lb 12.8 oz (80.6 kg)     Physical Exam    CMP ( most recent) CMP     Component Value Date/Time   NA 141 01/14/2023 0922   K 4.5 01/14/2023 0922   CL 100 01/14/2023 0922   CO2 25 01/14/2023 0922   GLUCOSE 64 (L) 01/14/2023 0922   GLUCOSE 145 (H) 04/30/2021 0855   BUN 18 01/14/2023 0922   CREATININE 1.02 (H) 01/14/2023 0922   CALCIUM 9.9 01/14/2023 0922   PROT 7.5 01/14/2023 0922   ALBUMIN 4.3 01/14/2023 0922   AST 18 01/14/2023 0922   ALT 15 01/14/2023 0922   ALKPHOS 77 01/14/2023 0922   BILITOT 0.7 01/14/2023 0922   GFRNONAA >60 04/30/2021 0855   GFRAA >60 07/31/2010 0430    Lab Results  Component Value Date   HGBA1C 9.1 (A) 01/18/2023   HGBA1C 8.8 (A) 09/08/2022   HGBA1C 8.3 (A) 03/30/2022    Lipid Panel     Component Value Date/Time   CHOL 148 01/14/2023 0922   TRIG 190 (H) 01/14/2023 0922   HDL 52 01/14/2023 0922   CHOLHDL 2.8 01/14/2023  0922   CHOLHDL 2.0 02/07/2010 0432   VLDL 9 02/07/2010 0432   LDLCALC 65 01/14/2023 0922   LABVLDL 31 01/14/2023 0922      Lab Results  Component Value Date   TSH 1.670 03/25/2022   TSH 0.727 10/11/2017   FREET4 1.12 03/25/2022      Assessment & Plan:   1. Type 2 diabetes mellitus with other specified complication, with long-term current use of insulin  (HCC)  - Mariah [name] has currently uncontrolled symptomatic type 2 DM since  63 years of age.  She presents with her CGM device.  More recently, her AGP report shows loss of control in her glycemic profile.  Her AGP report shows 46% time in range, 23% level 1 hyperglycemia, 31% level 2 hyperglycemia.  She has no hypoglycemia.   Her point-of-care A1c is 9.1% increasing from 8.1%.     Recent labs reviewed. - I had a long discussion with her about the progressive nature of diabetes and the pathology behind its complications. -her diabetes is complicated by obesity/sedentary life, polypharmacy, obstructive sleep apnea, comorbid conditions of hyperlipidemia, hypertension and she remains at a high risk for more acute and chronic complications which include CAD, CVA, CKD, retinopathy, and neuropathy. These are all discussed in detail with her.  - I discussed all available options of managing her diabetes including de-escalation of medications. I have counseled her on diet  and weight management  by adopting a Whole Food , Plant Predominant  ( WFPP) nutrition as recommended by [employer] of Lifestyle Medicine. Patient is encouraged to switch to  unprocessed or minimally processed  complex starch, adequate protein intake (mainly plant source), minimal liquid fat , no Solid fat,  plenty of fruits, and vegetables. -  she is advised to stick to a routine mealtimes to eat 3 complete meals a day and snack only when necessary ( to snack only to correct hypoglycemia BG <70 day time or <100 at night).   He did not engage with lifestyle medicine optimally.  However, she would like to try it again. - she acknowledges that there is a room for improvement in her food and drink choices. - Suggestion is made for her to avoid simple carbohydrates  from her diet including Cakes, Sweet Desserts, Ice Cream, Soda (diet and regular), Sweet Tea, Candies, Chips, Cookies, Store Bought Juices, Alcohol , Artificial Sweeteners,  Coffee Creamer, and Sugar-free Products, Lemonade. This will help patient to have more stable blood glucose profile and potentially avoid unintended weight gain.  The following Lifestyle Medicine recommendations according to American College of Lifestyle Medicine  [facility]) were discussed and and offered to patient and she  agrees  to start the journey:  A. Whole Foods, Plant-Based Nutrition comprising of fruits and vegetables, plant-based proteins, whole-grain carbohydrates was discussed in detail with the patient.   A list for source of those nutrients were also provided to the patient.  Patient will use only water or unsweetened tea for hydration. B.  The need to stay away from risky substances including alcohol, smoking; obtaining 7 to 9 hours of restorative sleep, at least 150 minutes of moderate intensity exercise weekly, the importance of healthy social connections,  and stress management techniques were discussed. C.  A full color page of  Calorie density of various food groups per pound showing examples of each food groups was provided to the patient.     - she will be scheduled with Penny Crumpton, RDN, CDE for individualized diabetes education.  -  I have approached her with the following plan to manage  her diabetes and patient agrees:   -She is advised to increase her Humalog  75/25 to 80 units with breakfast and 80 units with supper only when Premeal blood glucose readings are above 90 mg per DL, associated with continuous monitoring of blood glucose using her CGM.   -Because her engagement to lifestyle medicine is suboptimal.  - she is warned not to take insulin  without proper monitoring per orders. - Adjustment parameters are given to her for hypo and hyperglycemia in writing. - she is encouraged to call clinic for blood glucose levels less than 70 or above 200 mg /dl. - she is advised to continue Synjardy  to 12.05/998 mg XR p.o. daily at breakfast .    -She has tolerated Mounjaro , advised to increase Mounjaro  to 12.5 mg subcutaneously weekly.      - Specific targets for  A1c;  LDL, HDL,  and Triglycerides were discussed with the patient.  2) Blood Pressure /Hypertension:   Her blood pressure is controlled to target.  she is advised to continue her current medications including lisinopril  20 mg p.o.  daily with breakfast .  She should be considered for lower dose of lisinopril , 10 mg by her next refill.  3) Lipids/Hyperlipidemia:   Review of her recent lipid panel showed improved LDL at 65.    She is advised to continue pravastatin  40 mg p.o. daily at bedtime.  side effects and precautions discussed with her.  Multiple plant-based diet discussed and recommended above will help with dyslipidemia as well.   4)  Weight/Diet:  Body mass index is 32.69 kg/m.  -She is losing weight,   she is  a candidate for weight loss. I discussed with her the fact that loss of 5 - 10% of her  current body weight will have the most impact on her diabetes management.  The above detailed  ACLM recommendations for nutrition, exercise, sleep, social life, avoidance of risky substances, the need for restorative sleep   information will also detailed on discharge instructions.   5) abnormal liver ultrasound with history of nonalcoholic fatty liver disease 2 ill-defined areas of lesions in the liver, favored for fatty sparing.  However, since she has established GI care, I asked her to make a phone call and see her GI specialist for better explanation and further evaluation. Options are to repeat ultrasound in 6 months for possible MRI for better characterization of the identified lesions in the liver. She has hypoalbuminemia, elevated PT, will need a better assessment of her liver function and anatomy.    6) Chronic Care/Health Maintenance:  -she  is on ACEI/ARB and Statin medications and  is encouraged to initiate and continue to follow up with Ophthalmology, Dentist,  Podiatrist at least yearly or according to recommendations, and advised to   stay away from smoking. I have recommended yearly flu vaccine and pneumonia vaccine at least every 5 years; moderate intensity exercise for up to 150 minutes weekly; and  sleep for 7- 9 hours a day. Her foot exam is normal today. She is advised to discontinue her vitamin B12  supplements.   - she is  advised to maintain close follow up with Sun, Vyvyan, MD for primary care needs, as well as her other providers for optimal and coordinated care.   I spent  41  minutes in the care of the patient today including review of labs from CMP, Lipids, Thyroid  Function, Hematology (current and previous including  abstractions from other facilities); face-to-face time discussing  her blood glucose readings/logs, discussing hypoglycemia and hyperglycemia episodes and symptoms, medications doses, her options of short and long term treatment based on the latest standards of care / guidelines;  discussion about incorporating lifestyle medicine;  and documenting the encounter. Risk reduction counseling performed per USPSTF guidelines to reduce  obesity and cardiovascular risk factors.     Please refer to Patient Instructions for Blood Glucose Monitoring and Insulin /Medications Dosing Guide  in media tab for additional information. Please  also refer to  Patient Self Inventory in the Media  tab for reviewed elements of pertinent patient history.  Mariah [name] participated in the discussions, expressed understanding, and voiced agreement with the above plans.  All questions were answered to her satisfaction. she is encouraged to contact clinic should she have any questions or concerns prior to her return visit.     Follow up plan: - Return in about 4 months (around 05/18/2023) for Bring Meter/CGM Device/Logs- A1c in Office.  Ranny Earl, MD [facility] [employer] [address] [place], [place] [address] Phone: [phone]  Fax: [phone]    01/18/2023, 9:20 AM  This note was partially dictated with voice recognition software. Similar sounding words can be transcribed inadequately or may not  be corrected upon review.

## 2023-02-01 DIAGNOSIS — H35033 Hypertensive retinopathy, bilateral: Secondary | ICD-10-CM | POA: Diagnosis not present

## 2023-02-01 DIAGNOSIS — E119 Type 2 diabetes mellitus without complications: Secondary | ICD-10-CM | POA: Diagnosis not present

## 2023-02-01 DIAGNOSIS — H1045 Other chronic allergic conjunctivitis: Secondary | ICD-10-CM | POA: Diagnosis not present

## 2023-02-01 DIAGNOSIS — H02889 Meibomian gland dysfunction of unspecified eye, unspecified eyelid: Secondary | ICD-10-CM | POA: Diagnosis not present

## 2023-02-03 ENCOUNTER — Other Ambulatory Visit (HOSPITAL_COMMUNITY): Payer: Self-pay

## 2023-02-03 ENCOUNTER — Other Ambulatory Visit: Payer: Self-pay

## 2023-02-03 MED ORDER — EYSUVIS 0.25 % OP SUSP
1.0000 [drp] | Freq: Three times a day (TID) | OPHTHALMIC | 1 refills | Status: AC
  Filled 2023-02-03: qty 8.3, 28d supply, fill #0
  Filled 2023-03-14: qty 8.3, 27d supply, fill #0

## 2023-02-09 ENCOUNTER — Other Ambulatory Visit (HOSPITAL_COMMUNITY): Payer: Self-pay

## 2023-02-09 ENCOUNTER — Ambulatory Visit: Payer: Commercial Managed Care - PPO | Admitting: Physician Assistant

## 2023-02-09 ENCOUNTER — Other Ambulatory Visit: Payer: Self-pay

## 2023-02-09 ENCOUNTER — Encounter: Payer: Self-pay | Admitting: Physician Assistant

## 2023-02-09 VITALS — BP 154/92 | HR 94 | Ht 60.0 in | Wt 170.0 lb

## 2023-02-09 DIAGNOSIS — N3000 Acute cystitis without hematuria: Secondary | ICD-10-CM

## 2023-02-09 DIAGNOSIS — I10 Essential (primary) hypertension: Secondary | ICD-10-CM | POA: Diagnosis not present

## 2023-02-09 LAB — POCT URINALYSIS DIP (CLINITEK)
Bilirubin, UA: NEGATIVE
Blood, UA: NEGATIVE
Glucose, UA: 500 mg/dL — AB
Ketones, POC UA: NEGATIVE mg/dL
Leukocytes, UA: NEGATIVE
Nitrite, UA: NEGATIVE
POC PROTEIN,UA: NEGATIVE
Spec Grav, UA: 1.015 (ref 1.010–1.025)
Urobilinogen, UA: 0.2 U/dL
pH, UA: 6.5 (ref 5.0–8.0)

## 2023-02-09 MED ORDER — METHYLPHENIDATE HCL ER (OSM) 36 MG PO TBCR
36.0000 mg | EXTENDED_RELEASE_TABLET | Freq: Every morning | ORAL | 0 refills | Status: AC
  Filled 2023-03-15 – 2023-07-04 (×2): qty 30, 30d supply, fill #0

## 2023-02-09 MED ORDER — NITROFURANTOIN MONOHYD MACRO 100 MG PO CAPS: 100.0000 mg | ORAL_CAPSULE | Freq: Two times a day (BID) | ORAL | 0 refills | Status: AC

## 2023-02-09 MED ORDER — METHYLPHENIDATE HCL ER (OSM) 36 MG PO TBCR
36.0000 mg | EXTENDED_RELEASE_TABLET | Freq: Every morning | ORAL | 0 refills | Status: AC
  Filled 2023-02-09: qty 30, 30d supply, fill #0

## 2023-02-09 NOTE — Patient Instructions (Addendum)
 VISIT SUMMARY:  During today's visit, we discussed your bilateral hip pain and increased urinary frequency, which may indicate a urinary tract infection (UTI). We also reviewed your hypertension management, as your blood pressure was elevated during the visit.  YOUR PLAN:  -SUSPECTED URINARY TRACT INFECTION: A urinary tract infection (UTI) is an infection in any part of your urinary system. You reported increased frequency of urination and hip pain, which can be symptoms of a UTI. We will start you on Macrobid , an antibiotic, to be taken twice daily for 5 days. Please continue to drink plenty of water.  -HYPERTENSION: Hypertension, or high blood pressure, is a condition where the force of the blood against your artery walls is too high. Your blood pressure was elevated today. Please monitor your blood pressure daily for the next two weeks and continue taking Lisinopril  as prescribed. Follow up with your primary care provider if your blood pressure remains elevated.  INSTRUCTIONS:  Please monitor your blood pressure daily for the next two weeks. If it remains elevated, schedule a follow-up appointment with your primary care provider. Additionally, we will collect a urine sample today for testing. Start taking Macrobid  twice daily for 5 days and increase your water intake.  Kirk CANDIE Sage, PA-C Physician Assistant [facility] [email or web address]  Urinary Tract Infection, Female A urinary tract infection (UTI) is an infection in your urinary tract. The urinary tract is made up of organs that make, store, and get rid of pee (urine) in your body. These organs include: The kidneys. The ureters. The bladder. The urethra. What are the causes? Most UTIs are caused by germs called bacteria. They may be in or near your genitals. These germs grow and cause swelling in your urinary tract. What increases the risk? You're more likely to get a UTI  if: You're a female. The urethra is shorter in females than in males. You have a soft tube called a catheter that drains your pee. You can't control when you pee or poop. You have trouble peeing because of: A kidney stone. A urinary blockage. A nerve condition that affects your bladder. Not getting enough to drink. You're sexually active. You use a birth control inside your vagina, like spermicide. You're pregnant. You have low levels of the hormone estrogen in your body. You're an older adult. You're also more likely to get a UTI if you have other health problems. These may include: Diabetes. A weak immune system. Your immune system is your body's defense system. Sickle cell disease. Injury of the spine. What are the signs or symptoms? Symptoms may include: Needing to pee right away. Peeing small amounts often. Pain or burning when you pee. Blood in your pee. Pee that smells bad or odd. Pain in your belly or lower back. You may also: Feel confused. This may be the first symptom in older adults. Vomit. Not feel hungry. Feel tired or easily annoyed. Have a fever or chills. How is this diagnosed? A UTI is diagnosed based on your medical history and an exam. You may also have other tests. These may include: Pee tests. Blood tests. Tests for sexually transmitted infections (STIs). If you've had more than one UTI, you may need to have imaging studies done to find out why you keep getting them. How is this treated? A UTI can be treated by: Taking antibiotics or other medicines. Drinking enough fluid to keep your pee pale yellow. In rare cases, a UTI can cause a very bad condition called sepsis.  Sepsis may be treated in the hospital. Follow these instructions at home: Medicines Take your medicines only as told by your health care provider. If you were given antibiotics, take them as told by your provider. Do not stop taking them even if you start to feel better. General  instructions Make sure you: Pee often and fully. Do not hold your pee for a long time. Wipe from front to back after you pee or poop. Use each tissue only once when you wipe. Pee after you have sex. Do not douche or use sprays or powders in your genital area. Contact a health care provider if: Your symptoms don't get better after 1-2 days of taking antibiotics. Your symptoms go away and then come back. You have a fever or chills. You vomit or feel like you may vomit. Get help right away if: You have very bad pain in your back or lower belly. You faint. This information is not intended to replace advice given to you by your health care provider. Make sure you discuss any questions you have with your health care provider. Document Revised: 07/29/2022 Document Reviewed: 03/26/2022 Elsevier Patient Education  2024 [employer].

## 2023-02-09 NOTE — Progress Notes (Signed)
 New Patient Office Visit  Subjective    Patient ID: Mariah [name], female    DOB: March 28, 1960  Age: 63 y.o. MRN: [id number]  CC:  Chief Complaint  Patient presents with   Urinary Tract Infection    Lower back pain    Discussed the use of AI scribe software for clinical note transcription with the patient, who gave verbal consent to proceed.  History of Present Illness         The patient, with a history of hypertension and diabetes, presents with bilateral lower back pain and increased urinary frequency. She reports feeling unwell after sitting in a chair for a couple of hours. The low back pain reminded her of a similar episode about twelve years ago, which was treated with a shot, leading her to suspect a urinary tract infection (UTI). She reports drinking plenty of water and occasionally holding urine when busy. She denies dysuria, abdominal pain, fever, chills, and nausea. She also reports a headache the previous night, which was unusual for her and was relieved with Tylenol . She has not checked her blood pressure at home recently, but it was found to be elevated during the current visit.    Outpatient Encounter Medications as of 02/09/2023  Medication Sig   ADVAIR  DISKUS 500-50 MCG/ACT AEPB Inhale 1 puff into the lungs in the morning and at bedtime.   albuterol  (VENTOLIN  HFA) 108 (90 Base) MCG/ACT inhaler Inhale 1 - 2 puffs into the lungs every 6 hours as needed for wheezing or shortness of breath.   aspirin 81 MG EC tablet Take 81 mg by mouth daily.   aspirin-acetaminophen -caffeine (EXCEDRIN MIGRAINE) 250-250-65 MG tablet Take 1 tablet by mouth every 6 (six) hours as needed for headache.   azelastine  (ASTELIN ) 0.1 % nasal spray Place 1 spray into each nostril 1 - 2 times daily.   Biotin 2500 MCG CAPS Take 2,500 mcg by mouth at bedtime.   brimonidine  (ALPHAGAN  P) 0.1 % SOLN Place 1 drop into both eyes 2 (two) times daily.   cetirizine  (ZYRTEC  ALLERGY) 10 MG tablet Take 1  tablet (10 mg total) by mouth at bedtime as needed for allergies or itching   cetirizine  (ZYRTEC ) 10 MG tablet Take 1 tablet (10 mg total) by mouth at bedtime.   cholecalciferol (VITAMIN D3) 25 MCG (1000 UNIT) tablet Take 1,000 Units by mouth at bedtime.   Continuous Glucose Sensor (FREESTYLE LIBRE 3 SENSOR) MISC Inject 1 sensor to the skin every 14 days for continuous glucose monitoring as directed.   DULoxetine  (CYMBALTA ) 60 MG capsule Take 1 capsule (60 mg) by mouth daily.   DULoxetine  (CYMBALTA ) 60 MG capsule Take 1 capsule (60 mg total) by mouth daily.   Empagliflozin -metFORMIN  HCl ER (SYNJARDY  XR) 12.05-998 MG TB24 Take 1 tablet by mouth daily with breakfast.   EPINEPHrine  0.3 mg/0.3 mL IJ SOAJ injection Inject 0.3 mg into the muscle as needed for anaphylaxis.   escitalopram (LEXAPRO) 20 MG tablet Take 1 tablet every day by oral route.   estradiol  (ESTRACE  VAGINAL) 0.1 MG/GM vaginal cream Place 1 gram vaginally 2 times a week at bedtime   fluconazole  (DIFLUCAN ) 150 MG tablet Take 1 tablet (150 mg) by mouth every 3 days.   Glucagon , rDNA, (GLUCAGON  EMERGENCY) 1 MG KIT Use as directed.   glycopyrrolate  (ROBINUL ) 1 MG tablet Take 1 tablet (1 mg total) by mouth 2 (two) times daily.   HUMALOG  MIX 75/25 KWIKPEN (75-25) 100 UNIT/ML KwikPen Inject 80 Units into the skin 2 (  two) times daily before a meal.   Insulin  Pen Needle 31G X 5 MM MISC Use to inject insulin    Insulin  Pen Needle 32G X 4 MM MISC Use to inject insulin  twice daily   lisinopril  (ZESTRIL ) 20 MG tablet Take 1 tablet (20 mg total) by mouth every morning.   Loteprednol  Etabonate (EYSUVIS ) 0.25 % SUSP Place 1 drop into both eyes 3 (three) times daily.   meclizine (ANTIVERT) 25 MG tablet 1 TABLET FOUR TIMES A DAY AS NEEDED ORALLY 5 DAYS   methylphenidate  (CONCERTA ) 27 MG PO CR tablet Take 1 tablet by mouth once daily in the morning   methylphenidate  (CONCERTA ) 36 MG PO CR tablet Take 1 tablet by mouth in the morning --may fill 30 days  after last prescription Once a day   methylphenidate  (CONCERTA ) 36 MG PO CR tablet Take 1 tablet by mouth in the morning --may fill 30 days after last prescription   methylphenidate  (CONCERTA ) 36 MG PO CR tablet Take 1 tablet (36 mg total) by mouth in the morning.   methylphenidate  (CONCERTA ) 36 MG PO CR tablet Take 1 tablet (36 mg total) by mouth in the morning.   Multiple Vitamins-Minerals (OCUVITE PO) Take 1 tablet by mouth daily.   Niacin (VITAMIN B-3 PO) Take 1 tablet by mouth daily.   nitrofurantoin , macrocrystal-monohydrate, (MACROBID ) 100 MG capsule Take 1 capsule (100 mg total) by mouth 2 (two) times daily for 5 days.   ondansetron  (ZOFRAN -ODT) 8 MG disintegrating tablet Place 1 tablet(s) EVERY 8 HOURS by translingual route for 2 days prn n/v   pantoprazole  (PROTONIX ) 40 MG tablet Take 1 tablet (40 mg total) by mouth every morning.   pravastatin  (PRAVACHOL ) 40 MG tablet Take 1 tablet (40 mg total) by mouth at bedtime.   pravastatin  (PRAVACHOL ) 40 MG tablet Take 1 tablet (40 mg total) by mouth at bedtime.   Probiotic Product (PROBIOTIC DAILY PO) Take 1 capsule by mouth at bedtime.   tirzepatide  (MOUNJARO ) 12.5 MG/0.5ML Pen Inject 12.5 mg into the skin once a week.   triamcinolone  cream (KENALOG ) 0.1 % Apply externally twice a day as needed   vitamin C (ASCORBIC ACID) 250 MG tablet Take 250 mg by mouth at bedtime.   zolpidem  (AMBIEN ) 10 MG tablet Take 1/2-1 tablet by mouth at bedtime, as needed (30 days)   No facility-administered encounter medications on file as of 02/09/2023.    Past Medical History:  Diagnosis Date   Allergy    Anemia    Anxiety    Arthritis    Asthma    Body odor    from colostomy takedown   Cervicalgia    Depression    Diabetes mellitus without complication (HCC)    Fibroid    GERD (gastroesophageal reflux disease)    Glaucoma    Headache    Heart murmur    as a child   Hyperlipidemia    Hypertension    Hypothyroidism    Infertility    OSA  (obstructive sleep apnea)    upper airway resistance syndrome on CPAP at 9cm H2O   Reflux    Vertigo     Past Surgical History:  Procedure Laterality Date   COLECTOMY WITH COLOSTOMY CREATION/HARTMANN PROCEDURE  02/07/2010   Perforated   COLOSTOMY TAKEDOWN  07/28/2010   Lap colostomy takedown   LAPAROTOMY N/A 05/07/2021   Procedure: ABDOMINAL WALL EXPLORATION, PROBABLE REMOVAL OF FOREIGN BODY;  Surgeon: Sheldon Standing, MD;  Location: [facility];  Service: General;  Laterality: N/A;  TONSILLECTOMY     VENTRAL HERNIA REPAIR  02/07/2010   Primary repair of old hernia    Family History  Problem Relation Age of Onset   Diabetes Mother    Hypertension Mother    Hyperlipidemia Mother    Diabetes Father    Hypertension Father    Heart attack Father    Diabetes Maternal Grandmother    Hypertension Maternal Grandmother    Heart disease Maternal Grandmother    Cancer Maternal Grandfather        COLON   Breast cancer Paternal Grandmother        Age 37's    Social History   Socioeconomic History   Marital status: Married    Spouse name: Not on file   Number of children: Not on file   Years of education: Not on file   Highest education level: Not on file  Occupational History   Not on file  Tobacco Use   Smoking status: Former    Current packs/day: 0.00    Types: Cigarettes    Quit date: 11/19/1989    Years since quitting: 33.2   Smokeless tobacco: Never  Vaping Use   Vaping status: Never Used  Substance and Sexual Activity   Alcohol use: Not Currently   Drug use: No   Sexual activity: Not Currently    Partners: Male    Birth control/protection: Post-menopausal    Comment: 1ST intercourse-17, partners- 3, married- 24 yrs   Other Topics Concern   Not on file  Social History Narrative   Not on file   Social Drivers of Health   Financial Resource Strain: Not on file  Food Insecurity: No Food Insecurity (02/05/2022)   Hunger Vital Sign    Worried About Running Out of Food  in the Last Year: Never true    Ran Out of Food in the Last Year: Never true  Transportation Needs: No Transportation Needs (02/05/2022)   PRAPARE - Administrator, Civil Service (Medical): No    Lack of Transportation (Non-Medical): No  Physical Activity: Not on file  Stress: Not on file  Social Connections: Not on file  Intimate Partner Violence: Not At Risk (02/05/2022)   Humiliation, Afraid, Rape, and Kick questionnaire    Fear of Current or Ex-Partner: No    Emotionally Abused: No    Physically Abused: No    Sexually Abused: No    Review of Systems  Constitutional:  Negative for chills and fever.  HENT: Negative.    Eyes: Negative.   Respiratory:  Negative for shortness of breath.   Cardiovascular:  Negative for chest pain.  Gastrointestinal:  Negative for abdominal pain.  Genitourinary:  Positive for frequency. Negative for dysuria.  Musculoskeletal:  Positive for back pain.  Skin: Negative.   Neurological:  Positive for headaches.  Endo/Heme/Allergies: Negative.   Psychiatric/Behavioral: Negative.          Objective    BP (!) 154/92 (BP Location: Left Arm, Patient Position: Sitting, Cuff Size: Normal)   Pulse 94   Ht 5' (1.524 m)   Wt 170 lb (77.1 kg)   SpO2 98%   BMI 33.20 kg/m   Physical Exam Vitals and nursing note reviewed.  Constitutional:      Appearance: Normal appearance.  HENT:     Head: Normocephalic and atraumatic.     Right Ear: External ear normal.     Left Ear: External ear normal.     Nose: Nose normal.  Mouth/Throat:     Mouth: Mucous membranes are moist.     Pharynx: Oropharynx is clear.  Eyes:     Extraocular Movements: Extraocular movements intact.     Conjunctiva/sclera: Conjunctivae normal.     Pupils: Pupils are equal, round, and reactive to light.  Cardiovascular:     Rate and Rhythm: Normal rate and regular rhythm.     Pulses: Normal pulses.     Heart sounds: Normal heart sounds.  Pulmonary:     Effort:  Pulmonary effort is normal.     Breath sounds: Normal breath sounds.  Abdominal:     Tenderness: There is right CVA tenderness and left CVA tenderness.  Musculoskeletal:        General: Normal range of motion.     Cervical back: Normal range of motion and neck supple.  Skin:    General: Skin is warm and dry.  Neurological:     General: No focal deficit present.     Mental Status: She is alert and oriented to person, place, and time.  Psychiatric:        Mood and Affect: Mood normal.        Behavior: Behavior normal.        Thought Content: Thought content normal.        Judgment: Judgment normal.       Assessment & Plan:   Problem List Items Addressed This Visit   None Visit Diagnoses       Acute cystitis without hematuria    -  Primary   Relevant Medications   nitrofurantoin , macrocrystal-monohydrate, (MACROBID ) 100 MG capsule   Other Relevant Orders   POCT URINALYSIS DIP (CLINITEK) (Completed)   Urine Culture     Elevated blood pressure reading in office with diagnosis of hypertension         1. Acute cystitis without hematuria (Primary) Reports increased frequency of urination and bilateral low back pain. No dysuria, abdominal pain, fever, chills, or nausea.  Will treat based on clinical presentation. -Start Macrobid  twice daily for 5 days. -Encourage increased water intake. - nitrofurantoin , macrocrystal-monohydrate, (MACROBID ) 100 MG capsule; Take 1 capsule (100 mg total) by mouth 2 (two) times daily for 5 days.  Dispense: 10 capsule; Refill: 0 - POCT URINALYSIS DIP (CLINITEK) - Urine Culture  2. Elevated blood pressure reading in office with diagnosis of hypertension Blood pressure elevated at 148/96.  -Advise patient to monitor blood pressure daily for the next two weeks. -Continue Lisinopril  as prescribed. -Recommend follow-up with primary care provider if blood pressure remains elevated.   I have reviewed the patient's medical history (PMH, PSH, Social  History, Family History, Medications, and allergies) , and have been updated if relevant. I spent 30 minutes reviewing chart and  face to face time with patient.    Return if symptoms worsen or fail to improve.   Kirk RAMAN Mayers, PA-C

## 2023-02-11 ENCOUNTER — Other Ambulatory Visit: Payer: Self-pay

## 2023-02-12 LAB — URINE CULTURE

## 2023-02-14 ENCOUNTER — Encounter: Payer: Self-pay | Admitting: Physician Assistant

## 2023-02-14 ENCOUNTER — Other Ambulatory Visit (HOSPITAL_COMMUNITY)
Admission: RE | Admit: 2023-02-14 | Discharge: 2023-02-14 | Disposition: A | Discharge status: Home / Self Care | Payer: Self-pay | Source: Ambulatory Visit | Attending: Oncology | Admitting: Oncology

## 2023-02-14 DIAGNOSIS — Z006 Encounter for examination for normal comparison and control in clinical research program: Secondary | ICD-10-CM | POA: Insufficient documentation

## 2023-02-16 ENCOUNTER — Other Ambulatory Visit: Payer: Self-pay

## 2023-02-17 ENCOUNTER — Other Ambulatory Visit (HOSPITAL_COMMUNITY): Payer: Self-pay

## 2023-02-18 ENCOUNTER — Other Ambulatory Visit: Payer: Self-pay

## 2023-02-21 ENCOUNTER — Other Ambulatory Visit (HOSPITAL_COMMUNITY): Payer: Self-pay

## 2023-03-02 LAB — GENECONNECT MOLECULAR SCREEN: Genetic Analysis Overall Interpretation: NEGATIVE

## 2023-03-14 ENCOUNTER — Other Ambulatory Visit: Payer: Self-pay | Admitting: Allergy and Immunology

## 2023-03-14 ENCOUNTER — Other Ambulatory Visit: Payer: Self-pay | Admitting: "Endocrinology

## 2023-03-14 ENCOUNTER — Other Ambulatory Visit (HOSPITAL_COMMUNITY): Payer: Self-pay

## 2023-03-14 ENCOUNTER — Other Ambulatory Visit: Payer: Self-pay

## 2023-03-14 MED ORDER — PANTOPRAZOLE SODIUM 40 MG PO TBEC
40.0000 mg | DELAYED_RELEASE_TABLET | Freq: Every morning | ORAL | 1 refills | Status: DC
  Filled 2023-03-14: qty 30, 30d supply, fill #0
  Filled 2023-09-19: qty 30, 30d supply, fill #1

## 2023-03-15 ENCOUNTER — Other Ambulatory Visit (HOSPITAL_COMMUNITY): Payer: Self-pay

## 2023-03-15 MED ORDER — FREESTYLE LIBRE 3 SENSOR MISC
0 refills | Status: DC
  Filled 2023-03-15: qty 6, 84d supply, fill #0

## 2023-03-16 ENCOUNTER — Other Ambulatory Visit: Payer: Self-pay

## 2023-03-16 ENCOUNTER — Other Ambulatory Visit (HOSPITAL_COMMUNITY): Payer: Self-pay

## 2023-03-23 ENCOUNTER — Other Ambulatory Visit: Payer: Self-pay

## 2023-03-25 ENCOUNTER — Other Ambulatory Visit: Payer: Self-pay

## 2023-03-28 ENCOUNTER — Other Ambulatory Visit (HOSPITAL_COMMUNITY): Payer: Self-pay

## 2023-03-28 ENCOUNTER — Other Ambulatory Visit: Payer: Self-pay | Admitting: "Endocrinology

## 2023-03-28 DIAGNOSIS — Z794 Long term (current) use of insulin: Secondary | ICD-10-CM

## 2023-03-28 DIAGNOSIS — E1169 Type 2 diabetes mellitus with other specified complication: Secondary | ICD-10-CM

## 2023-03-29 ENCOUNTER — Other Ambulatory Visit: Payer: Self-pay

## 2023-04-04 DIAGNOSIS — G4733 Obstructive sleep apnea (adult) (pediatric): Secondary | ICD-10-CM | POA: Diagnosis not present

## 2023-04-06 ENCOUNTER — Other Ambulatory Visit (HOSPITAL_COMMUNITY): Payer: Self-pay

## 2023-04-06 ENCOUNTER — Other Ambulatory Visit: Payer: Self-pay | Admitting: "Endocrinology

## 2023-04-06 DIAGNOSIS — Z794 Long term (current) use of insulin: Secondary | ICD-10-CM

## 2023-04-06 DIAGNOSIS — E1169 Type 2 diabetes mellitus with other specified complication: Secondary | ICD-10-CM

## 2023-04-06 MED ORDER — HUMALOG MIX 75/25 KWIKPEN (75-25) 100 UNIT/ML ~~LOC~~ SUPN
PEN_INJECTOR | SUBCUTANEOUS | 0 refills | Status: DC
  Filled 2023-04-06: qty 45, 30d supply, fill #0
  Filled 2023-05-10: qty 45, 30d supply, fill #1
  Filled 2023-06-11: qty 45, 30d supply, fill #2

## 2023-04-07 ENCOUNTER — Other Ambulatory Visit (HOSPITAL_COMMUNITY): Payer: Self-pay

## 2023-04-07 DIAGNOSIS — E113293 Type 2 diabetes mellitus with mild nonproliferative diabetic retinopathy without macular edema, bilateral: Secondary | ICD-10-CM | POA: Diagnosis not present

## 2023-04-07 DIAGNOSIS — D509 Iron deficiency anemia, unspecified: Secondary | ICD-10-CM | POA: Diagnosis not present

## 2023-04-07 DIAGNOSIS — G4700 Insomnia, unspecified: Secondary | ICD-10-CM | POA: Diagnosis not present

## 2023-04-07 DIAGNOSIS — G4733 Obstructive sleep apnea (adult) (pediatric): Secondary | ICD-10-CM | POA: Diagnosis not present

## 2023-04-07 DIAGNOSIS — K219 Gastro-esophageal reflux disease without esophagitis: Secondary | ICD-10-CM | POA: Diagnosis not present

## 2023-04-07 DIAGNOSIS — F900 Attention-deficit hyperactivity disorder, predominantly inattentive type: Secondary | ICD-10-CM | POA: Diagnosis not present

## 2023-04-07 DIAGNOSIS — E781 Pure hyperglyceridemia: Secondary | ICD-10-CM | POA: Diagnosis not present

## 2023-04-07 DIAGNOSIS — Z794 Long term (current) use of insulin: Secondary | ICD-10-CM | POA: Diagnosis not present

## 2023-04-07 DIAGNOSIS — E782 Mixed hyperlipidemia: Secondary | ICD-10-CM | POA: Diagnosis not present

## 2023-04-07 DIAGNOSIS — I10 Essential (primary) hypertension: Secondary | ICD-10-CM | POA: Diagnosis not present

## 2023-04-08 ENCOUNTER — Other Ambulatory Visit: Payer: Self-pay

## 2023-04-08 ENCOUNTER — Other Ambulatory Visit (HOSPITAL_BASED_OUTPATIENT_CLINIC_OR_DEPARTMENT_OTHER): Payer: Self-pay

## 2023-04-08 ENCOUNTER — Other Ambulatory Visit (HOSPITAL_COMMUNITY): Payer: Self-pay

## 2023-04-08 LAB — BASIC METABOLIC PANEL WITH GFR
BUN: 16 (ref 4–21)
Creatinine: 0.9 (ref 0.5–1.1)

## 2023-04-08 LAB — LIPID PANEL
Cholesterol: 150 (ref 0–200)
EGFR: 69
HDL: 56 (ref 35–70)
LDL Cholesterol: 67
Triglycerides: 159 (ref 40–160)

## 2023-04-09 ENCOUNTER — Other Ambulatory Visit: Payer: Self-pay | Admitting: "Endocrinology

## 2023-04-09 ENCOUNTER — Other Ambulatory Visit (HOSPITAL_COMMUNITY): Payer: Self-pay

## 2023-04-09 DIAGNOSIS — Z794 Long term (current) use of insulin: Secondary | ICD-10-CM

## 2023-04-10 ENCOUNTER — Other Ambulatory Visit (HOSPITAL_COMMUNITY): Payer: Self-pay

## 2023-04-11 ENCOUNTER — Other Ambulatory Visit: Payer: Self-pay

## 2023-04-11 ENCOUNTER — Other Ambulatory Visit (HOSPITAL_COMMUNITY): Payer: Self-pay

## 2023-04-11 MED ORDER — GLYCOPYRROLATE 1 MG PO TABS
1.0000 mg | ORAL_TABLET | Freq: Two times a day (BID) | ORAL | 3 refills | Status: AC
  Filled 2023-11-22: qty 180, 90d supply, fill #0
  Filled 2023-12-01 – 2024-01-25 (×2): qty 180, 90d supply, fill #1

## 2023-04-11 MED ORDER — DULOXETINE HCL 60 MG PO CPEP
60.0000 mg | ORAL_CAPSULE | Freq: Every day | ORAL | 3 refills | Status: AC
  Filled 2023-11-01 (×2): qty 90, 90d supply, fill #0
  Filled 2023-12-01 – 2024-01-25 (×2): qty 90, 90d supply, fill #1

## 2023-04-11 MED ORDER — LISINOPRIL 20 MG PO TABS
20.0000 mg | ORAL_TABLET | Freq: Every morning | ORAL | 3 refills | Status: AC
  Filled 2023-04-11 – 2023-09-19 (×2): qty 90, 90d supply, fill #0

## 2023-04-11 MED ORDER — METHYLPHENIDATE HCL ER (OSM) 36 MG PO TBCR
36.0000 mg | EXTENDED_RELEASE_TABLET | Freq: Every morning | ORAL | 0 refills | Status: AC
  Filled 2023-04-21 – 2023-04-25 (×2): qty 30, 30d supply, fill #0

## 2023-04-11 MED ORDER — MONTELUKAST SODIUM 10 MG PO TABS
10.0000 mg | ORAL_TABLET | Freq: Every day | ORAL | 1 refills | Status: AC
  Filled 2023-04-11 – 2023-08-10 (×4): qty 90, 90d supply, fill #0
  Filled 2023-12-01: qty 90, 90d supply, fill #1

## 2023-04-11 MED ORDER — METHYLPHENIDATE HCL ER (OSM) 36 MG PO TBCR
36.0000 mg | EXTENDED_RELEASE_TABLET | Freq: Every morning | ORAL | 0 refills | Status: AC
  Filled 2023-05-21 – 2023-06-01 (×4): qty 30, 30d supply, fill #0

## 2023-04-11 MED ORDER — METHYLPHENIDATE HCL ER (OSM) 36 MG PO TBCR
36.0000 mg | EXTENDED_RELEASE_TABLET | Freq: Every morning | ORAL | 0 refills | Status: AC
  Filled 2023-08-03 – 2023-08-10 (×2): qty 30, 30d supply, fill #0

## 2023-04-12 ENCOUNTER — Encounter: Payer: Self-pay | Admitting: Pharmacist

## 2023-04-12 ENCOUNTER — Other Ambulatory Visit: Payer: Self-pay

## 2023-04-12 MED ORDER — SYNJARDY XR 12.5-1000 MG PO TB24
1.0000 | ORAL_TABLET | Freq: Every day | ORAL | 0 refills | Status: DC
  Filled 2023-04-12 – 2023-04-25 (×3): qty 90, 90d supply, fill #0

## 2023-04-13 ENCOUNTER — Other Ambulatory Visit (HOSPITAL_COMMUNITY): Payer: Self-pay

## 2023-04-13 ENCOUNTER — Other Ambulatory Visit: Payer: Self-pay

## 2023-04-15 ENCOUNTER — Other Ambulatory Visit: Payer: Self-pay

## 2023-04-18 ENCOUNTER — Other Ambulatory Visit: Payer: Self-pay

## 2023-04-21 ENCOUNTER — Other Ambulatory Visit (HOSPITAL_COMMUNITY): Payer: Self-pay

## 2023-04-21 ENCOUNTER — Other Ambulatory Visit: Payer: Self-pay

## 2023-04-22 ENCOUNTER — Other Ambulatory Visit: Payer: Self-pay

## 2023-04-25 ENCOUNTER — Other Ambulatory Visit: Payer: Self-pay

## 2023-04-25 ENCOUNTER — Other Ambulatory Visit (HOSPITAL_COMMUNITY): Payer: Self-pay

## 2023-04-28 ENCOUNTER — Other Ambulatory Visit: Payer: Self-pay

## 2023-05-05 ENCOUNTER — Other Ambulatory Visit (HOSPITAL_COMMUNITY): Payer: Self-pay

## 2023-05-09 DIAGNOSIS — Z0000 Encounter for general adult medical examination without abnormal findings: Secondary | ICD-10-CM | POA: Diagnosis not present

## 2023-05-09 DIAGNOSIS — Z13220 Encounter for screening for lipoid disorders: Secondary | ICD-10-CM | POA: Diagnosis not present

## 2023-05-10 ENCOUNTER — Other Ambulatory Visit: Payer: Self-pay | Admitting: "Endocrinology

## 2023-05-10 DIAGNOSIS — Z794 Long term (current) use of insulin: Secondary | ICD-10-CM

## 2023-05-11 ENCOUNTER — Encounter: Payer: Self-pay | Admitting: Pharmacist

## 2023-05-11 ENCOUNTER — Other Ambulatory Visit (HOSPITAL_COMMUNITY): Payer: Self-pay

## 2023-05-11 ENCOUNTER — Other Ambulatory Visit: Payer: Self-pay

## 2023-05-11 MED ORDER — GLUCAGON EMERGENCY 1 MG IJ KIT
PACK | INTRAMUSCULAR | 0 refills | Status: DC
  Filled 2023-05-11: qty 1, 30d supply, fill #0

## 2023-05-12 ENCOUNTER — Other Ambulatory Visit (HOSPITAL_COMMUNITY): Payer: Self-pay

## 2023-05-13 ENCOUNTER — Other Ambulatory Visit: Payer: Self-pay

## 2023-05-16 ENCOUNTER — Other Ambulatory Visit: Payer: Self-pay

## 2023-05-16 ENCOUNTER — Other Ambulatory Visit (HOSPITAL_COMMUNITY): Payer: Self-pay

## 2023-05-17 ENCOUNTER — Other Ambulatory Visit: Payer: Self-pay

## 2023-05-17 ENCOUNTER — Other Ambulatory Visit (HOSPITAL_COMMUNITY): Payer: Self-pay

## 2023-05-17 DIAGNOSIS — M79642 Pain in left hand: Secondary | ICD-10-CM | POA: Diagnosis not present

## 2023-05-17 MED ORDER — EPINEPHRINE 0.3 MG/0.3ML IJ SOAJ
INTRAMUSCULAR | 3 refills | Status: AC
  Filled 2023-05-17: qty 2, 30d supply, fill #0
  Filled 2023-09-19: qty 2, 30d supply, fill #1
  Filled 2023-12-01: qty 2, 30d supply, fill #2

## 2023-05-19 ENCOUNTER — Ambulatory Visit: Payer: Commercial Managed Care - PPO | Admitting: "Endocrinology

## 2023-05-21 ENCOUNTER — Other Ambulatory Visit (HOSPITAL_COMMUNITY): Payer: Self-pay

## 2023-05-24 ENCOUNTER — Other Ambulatory Visit (HOSPITAL_COMMUNITY): Payer: Self-pay

## 2023-05-24 ENCOUNTER — Other Ambulatory Visit: Payer: Self-pay | Admitting: "Endocrinology

## 2023-05-25 ENCOUNTER — Encounter: Payer: Self-pay | Admitting: Pharmacist

## 2023-05-25 ENCOUNTER — Other Ambulatory Visit: Payer: Self-pay

## 2023-05-25 ENCOUNTER — Other Ambulatory Visit (HOSPITAL_COMMUNITY): Payer: Self-pay

## 2023-05-25 MED ORDER — FREESTYLE LIBRE 3 SENSOR MISC
0 refills | Status: DC
  Filled 2023-05-25 – 2023-06-11 (×2): qty 6, 84d supply, fill #0

## 2023-05-27 ENCOUNTER — Other Ambulatory Visit: Payer: Self-pay

## 2023-05-31 ENCOUNTER — Other Ambulatory Visit (HOSPITAL_COMMUNITY): Payer: Self-pay

## 2023-05-31 DIAGNOSIS — H401131 Primary open-angle glaucoma, bilateral, mild stage: Secondary | ICD-10-CM | POA: Diagnosis not present

## 2023-05-31 DIAGNOSIS — H1045 Other chronic allergic conjunctivitis: Secondary | ICD-10-CM | POA: Diagnosis not present

## 2023-05-31 DIAGNOSIS — H35033 Hypertensive retinopathy, bilateral: Secondary | ICD-10-CM | POA: Diagnosis not present

## 2023-05-31 DIAGNOSIS — E119 Type 2 diabetes mellitus without complications: Secondary | ICD-10-CM | POA: Diagnosis not present

## 2023-05-31 DIAGNOSIS — H02889 Meibomian gland dysfunction of unspecified eye, unspecified eyelid: Secondary | ICD-10-CM | POA: Diagnosis not present

## 2023-06-01 ENCOUNTER — Other Ambulatory Visit (HOSPITAL_COMMUNITY): Payer: Self-pay

## 2023-06-01 ENCOUNTER — Other Ambulatory Visit: Payer: Self-pay

## 2023-06-02 DIAGNOSIS — Z1231 Encounter for screening mammogram for malignant neoplasm of breast: Secondary | ICD-10-CM | POA: Diagnosis not present

## 2023-06-02 LAB — HM MAMMOGRAPHY

## 2023-06-03 ENCOUNTER — Encounter: Payer: Self-pay | Admitting: Nurse Practitioner

## 2023-06-11 ENCOUNTER — Other Ambulatory Visit (HOSPITAL_COMMUNITY): Payer: Self-pay

## 2023-06-13 ENCOUNTER — Encounter: Payer: Self-pay | Admitting: Podiatry

## 2023-06-16 ENCOUNTER — Other Ambulatory Visit (HOSPITAL_COMMUNITY): Payer: Self-pay

## 2023-06-16 ENCOUNTER — Encounter: Payer: Self-pay | Admitting: "Endocrinology

## 2023-06-16 ENCOUNTER — Ambulatory Visit: Admitting: "Endocrinology

## 2023-06-16 ENCOUNTER — Encounter: Payer: Self-pay | Admitting: Pharmacist

## 2023-06-16 ENCOUNTER — Other Ambulatory Visit: Payer: Self-pay

## 2023-06-16 VITALS — BP 124/74 | HR 92 | Ht 60.0 in | Wt 172.8 lb

## 2023-06-16 DIAGNOSIS — E1169 Type 2 diabetes mellitus with other specified complication: Secondary | ICD-10-CM | POA: Diagnosis not present

## 2023-06-16 DIAGNOSIS — I10 Essential (primary) hypertension: Secondary | ICD-10-CM

## 2023-06-16 DIAGNOSIS — Z794 Long term (current) use of insulin: Secondary | ICD-10-CM | POA: Diagnosis not present

## 2023-06-16 DIAGNOSIS — E6609 Other obesity due to excess calories: Secondary | ICD-10-CM

## 2023-06-16 DIAGNOSIS — E66811 Obesity, class 1: Secondary | ICD-10-CM

## 2023-06-16 DIAGNOSIS — Z6832 Body mass index (BMI) 32.0-32.9, adult: Secondary | ICD-10-CM

## 2023-06-16 DIAGNOSIS — E782 Mixed hyperlipidemia: Secondary | ICD-10-CM

## 2023-06-16 LAB — POCT GLYCOSYLATED HEMOGLOBIN (HGB A1C): HbA1c, POC (controlled diabetic range): 10.1 % — AB (ref 0.0–7.0)

## 2023-06-16 MED ORDER — TIRZEPATIDE 15 MG/0.5ML ~~LOC~~ SOAJ
15.0000 mg | SUBCUTANEOUS | 1 refills | Status: DC
  Filled 2023-06-16: qty 2, 28d supply, fill #0

## 2023-06-16 MED ORDER — SYNJARDY XR 25-1000 MG PO TB24
1.0000 | ORAL_TABLET | Freq: Every day | ORAL | 1 refills | Status: AC
  Filled 2023-06-16 – 2023-09-02 (×3): qty 90, 90d supply, fill #0
  Filled 2023-12-01: qty 90, 90d supply, fill #1

## 2023-06-16 NOTE — Progress Notes (Signed)
 06/16/2023, 3:30 PM  Endocrinology follow-up note   Subjective:    Patient ID: Mariah [name], female    DOB: [birth year].  Murdis Flitton Comma-Watson is being seen in follow-up after she was seen in consultation for management of currently uncontrolled symptomatic diabetes requested by  Sun, Vyvyan, MD.   Past Medical History:  Diagnosis Date   Allergy    Anemia    Anxiety    Arthritis    Asthma    Body odor    from colostomy takedown   Cervicalgia    Depression    Diabetes mellitus without complication (HCC)    Fibroid    GERD (gastroesophageal reflux disease)    Glaucoma    Headache    Heart murmur    as a child   Hyperlipidemia    Hypertension    Hypothyroidism    Infertility    OSA (obstructive sleep apnea)    upper airway resistance syndrome on CPAP at 9cm H2O   Reflux    Vertigo     Past Surgical History:  Procedure Laterality Date   COLECTOMY WITH COLOSTOMY CREATION/HARTMANN PROCEDURE  02/07/2010   Perforated   COLOSTOMY TAKEDOWN  07/28/2010   Lap colostomy takedown   LAPAROTOMY N/A 05/07/2021   Procedure: ABDOMINAL WALL EXPLORATION, PROBABLE REMOVAL OF FOREIGN BODY;  Surgeon: Candyce Champagne, MD;  Location: [facility];  Service: General;  Laterality: N/A;   TONSILLECTOMY     VENTRAL HERNIA REPAIR  02/07/2010   Primary repair of old hernia    Social History   Socioeconomic History   Marital status: Married    Spouse name: Not on file   Number of children: Not on file   Years of education: Not on file   Highest education level: Not on file  Occupational History   Not on file  Tobacco Use   Smoking status: Former    Current packs/day: 0.00    Types: Cigarettes    Quit date: 11/19/1989    Years since quitting: 33.5   Smokeless tobacco: Never  Vaping Use   Vaping status: Never Used  Substance and Sexual Activity   Alcohol use: Not Currently   Drug use: No   Sexual  activity: Not Currently    Partners: Male    Birth control/protection: Post-menopausal    Comment: 1ST intercourse-17, partners- 3, married- 24 yrs   Other Topics Concern   Not on file  Social History Narrative   Not on file   Social Drivers of Health   Financial Resource Strain: Not on file  Food Insecurity: No Food Insecurity (02/05/2022)   Hunger Vital Sign    Worried About Running Out of Food in the Last Year: Never true    Ran Out of Food in the Last Year: Never true  Transportation Needs: No Transportation Needs (02/05/2022)   PRAPARE - Administrator, Civil Service (Medical): No    Lack of Transportation (Non-Medical): No  Physical Activity: Not on file  Stress: Not on file  Social Connections: Not on file    Family History  Problem Relation Age of Onset   Diabetes Mother    Hypertension Mother    Hyperlipidemia Mother  Diabetes Father    Hypertension Father    Heart attack Father    Diabetes Maternal Grandmother    Hypertension Maternal Grandmother    Heart disease Maternal Grandmother    Cancer Maternal Grandfather        COLON   Breast cancer Paternal Grandmother        Age 87's    Outpatient Encounter Medications as of 06/16/2023  Medication Sig   Empagliflozin -metFORMIN  HCl ER (SYNJARDY  XR) 25-1000 MG TB24 Take 1 tablet by mouth daily.   tirzepatide  (MOUNJARO ) 15 MG/0.5ML Pen Inject 15 mg into the skin once a week.   ADVAIR  DISKUS 500-50 MCG/ACT AEPB Inhale 1 puff into the lungs in the morning and at bedtime.   albuterol  (VENTOLIN  HFA) 108 (90 Base) MCG/ACT inhaler Inhale 1 - 2 puffs into the lungs every 6 hours as needed for wheezing or shortness of breath.   aspirin 81 MG EC tablet Take 81 mg by mouth daily.   aspirin-acetaminophen -caffeine (EXCEDRIN MIGRAINE) 250-250-65 MG tablet Take 1 tablet by mouth every 6 (six) hours as needed for headache.   azelastine  (ASTELIN ) 0.1 % nasal spray Place 1 spray into each nostril 1 - 2 times daily.    Biotin 2500 MCG CAPS Take 2,500 mcg by mouth at bedtime.   brimonidine  (ALPHAGAN  P) 0.1 % SOLN Place 1 drop into both eyes 2 (two) times daily.   cetirizine  (ZYRTEC  ALLERGY) 10 MG tablet Take 1 tablet (10 mg total) by mouth at bedtime as needed for allergies or itching   cetirizine  (ZYRTEC ) 10 MG tablet Take 1 tablet (10 mg total) by mouth at bedtime.   cholecalciferol (VITAMIN D3) 25 MCG (1000 UNIT) tablet Take 1,000 Units by mouth at bedtime.   Continuous Glucose Sensor (FREESTYLE LIBRE 3 SENSOR) MISC Inject 1 sensor to the skin every 14 days for continuous glucose monitoring as directed.   DULoxetine  (CYMBALTA ) 60 MG capsule Take 1 capsule (60 mg) by mouth daily.   DULoxetine  (CYMBALTA ) 60 MG capsule Take 1 capsule (60 mg total) by mouth daily.   DULoxetine  (CYMBALTA ) 60 MG capsule Take 1 capsule (60 mg total) by mouth daily.   EPINEPHrine  0.3 mg/0.3 mL IJ SOAJ injection Inject 0.3 mg into the muscle as needed for anaphylaxis.   EPINEPHrine  0.3 mg/0.3 mL IJ SOAJ injection Inject into anterior thigh as needed to treat severe allergic reaction.   escitalopram (LEXAPRO) 20 MG tablet Take 1 tablet every day by oral route.   estradiol  (ESTRACE  VAGINAL) 0.1 MG/GM vaginal cream Place 1 gram vaginally 2 times a week at bedtime   fluconazole  (DIFLUCAN ) 150 MG tablet Take 1 tablet (150 mg) by mouth every 3 days.   Glucagon , rDNA, (GLUCAGON  EMERGENCY) 1 MG KIT Use as directed.   glycopyrrolate  (ROBINUL ) 1 MG tablet Take 1 tablet (1 mg total) by mouth 2 (two) times daily.   glycopyrrolate  (ROBINUL ) 1 MG tablet Take 1 tablet (1 mg total) by mouth 2 (two) times daily.   HUMALOG  MIX 75/25 KWIKPEN (75-25) 100 UNIT/ML KwikPen Inject 80 Units into the skin 2 (two) times daily before a meal.   Insulin  Pen Needle 31G X 5 MM MISC Use to inject insulin    Insulin  Pen Needle 32G X 4 MM MISC Use to inject insulin  twice daily   lisinopril  (ZESTRIL ) 20 MG tablet Take 1 tablet (20 mg total) by mouth every morning.    lisinopril  (ZESTRIL ) 20 MG tablet Take 1 tablet (20 mg total) by mouth in the morning.   Loteprednol   Etabonate (EYSUVIS ) 0.25 % SUSP Place 1 drop into both eyes 3 (three) times daily.   meclizine (ANTIVERT) 25 MG tablet 1 TABLET FOUR TIMES A DAY AS NEEDED ORALLY 5 DAYS   methylphenidate  (CONCERTA ) 27 MG PO CR tablet Take 1 tablet by mouth once daily in the morning   methylphenidate  (CONCERTA ) 36 MG PO CR tablet Take 1 tablet by mouth once daily in the morning --may fill 30 days after last prescription   methylphenidate  (CONCERTA ) 36 MG PO CR tablet Take 1 tablet by mouth in the morning --may fill 30 days after last prescription   methylphenidate  (CONCERTA ) 36 MG PO CR tablet Take 1 tablet (36 mg total) by mouth in the morning.   methylphenidate  (CONCERTA ) 36 MG PO CR tablet Take 1 tablet (36 mg total) by mouth in the morning.   methylphenidate  (CONCERTA ) 36 MG PO CR tablet Take 1 tablet (36 mg total) by mouth in the morning.   methylphenidate  (CONCERTA ) 36 MG PO CR tablet Take 1 tablet (36 mg total) by mouth in the morning.   methylphenidate  (CONCERTA ) 36 MG PO CR tablet Take 1 tablet (36 mg total) by mouth in the morning.   montelukast  (SINGULAIR ) 10 MG tablet Take 1 tablet (10 mg total) by mouth daily.   Multiple Vitamins-Minerals (OCUVITE PO) Take 1 tablet by mouth daily.   Niacin (VITAMIN B-3 PO) Take 1 tablet by mouth daily.   ondansetron  (ZOFRAN -ODT) 8 MG disintegrating tablet Place 1 tablet(s) EVERY 8 HOURS by translingual route for 2 days prn n/v   pantoprazole  (PROTONIX ) 40 MG tablet Take 1 tablet (40 mg total) by mouth in the morning.   pravastatin  (PRAVACHOL ) 40 MG tablet Take 1 tablet (40 mg total) by mouth at bedtime.   pravastatin  (PRAVACHOL ) 40 MG tablet Take 1 tablet (40 mg total) by mouth at bedtime.   Probiotic Product (PROBIOTIC DAILY PO) Take 1 capsule by mouth at bedtime.   triamcinolone  cream (KENALOG ) 0.1 % Apply externally twice a day as needed   vitamin C (ASCORBIC ACID)  250 MG tablet Take 250 mg by mouth at bedtime.   zolpidem  (AMBIEN ) 10 MG tablet Take 1/2-1 tablet by mouth at bedtime, as needed (30 days)   [DISCONTINUED] Empagliflozin -metFORMIN  HCl ER (SYNJARDY  XR) 12.05-998 MG TB24 Take 1 tablet by mouth daily with breakfast.   [DISCONTINUED] HUMALOG  MIX 75/25 KWIKPEN (75-25) 100 UNIT/ML KwikPen Inject 80 Units into the skin daily before breakfast AND 70 Units daily before supper.   [DISCONTINUED] tirzepatide  (MOUNJARO ) 12.5 MG/0.5ML Pen Inject 12.5 mg into the skin once a week.   No facility-administered encounter medications on file as of 06/16/2023.    ALLERGIES: Allergies  Allergen Reactions   Codeine Hives, Swelling and Other (See Comments)   Fluticasone  Other (See Comments)    Nose bleeds    Other Other (See Comments)    mushrooms    VACCINATION STATUS: Immunization History  Administered Date(s) Administered   Influenza Inj Mdck Quad Pf 09/29/2018   Influenza,inj,quad, With Preservative 10/05/2018, 11/21/2019   Influenza-Unspecified 09/29/2018, 10/15/2020   Moderna Sars-Covid-2 Vaccination 01/03/2019, 02/03/2019, 09/14/2019   Pfizer Covid-19 Vaccine Bivalent Booster 12yrs & up 11/21/2020   Pfizer(Comirnaty )Fall Seasonal Vaccine 12 years and older 03/04/2022, 11/03/2022   Pneumococcal Polysaccharide-23 07/11/2006    Diabetes She presents for her follow-up diabetic visit. She has type 2 diabetes mellitus. Onset time: She was diagnosed at approximate age of 50 years. Her disease course has been worsening. There are no hypoglycemic associated symptoms. Pertinent negatives for  hypoglycemia include no confusion, headaches, pallor or seizures. Pertinent negatives for diabetes include no chest pain, no fatigue, no polydipsia, no polyphagia and no polyuria. There are no hypoglycemic complications. Symptoms are worsening. (On repeat condition Soliqua obesity, hyperlipidemia, hypertension, MASLD, obstructive sleep apnea.) Risk factors for coronary  artery disease include dyslipidemia, diabetes mellitus, hypertension, obesity, family history, tobacco exposure, sedentary lifestyle and post-menopausal. Current diabetic treatments: She is currently on Mounjaro  7.5 mg weekly, Humalog  75/25 70 units a.m., 70 units p.m., Synjardy  12.05/998 mg p.o. once a day at breakfast. Her weight is increasing steadily. She is following a generally unhealthy diet. When asked about meal planning, she reported none. She participates in exercise intermittently. Her home blood glucose trend is increasing steadily. Her breakfast blood glucose range is generally 180-200 mg/dl. Her lunch blood glucose range is generally 180-200 mg/dl. Her dinner blood glucose range is generally 180-200 mg/dl. Her bedtime blood glucose range is generally 180-200 mg/dl. Her overall blood glucose range is 180-200 mg/dl. (She presents with her CGM device.  More recently, her AGP report shows loss of control in her glycemic profile.  AGP report shows average blood glucose of 176, 51% time in range, 36% Libre 1 hyperglycemia, 11% able to hyperglycemia.  She does have 2% daily 1 hypoglycemia.  Her point-of-care A1c is 10.1% progressively worsening from 8.1%.     ) An ACE inhibitor/angiotensin II receptor blocker is being taken.  Hyperlipidemia This is a chronic problem. The current episode started more than 1 year ago. The problem is uncontrolled. Pertinent negatives include no chest pain, myalgias or shortness of breath. Current antihyperlipidemic treatment includes statins. Risk factors for coronary artery disease include dyslipidemia, diabetes mellitus, family history, obesity, hypertension, a sedentary lifestyle and post-menopausal.  Hypertension This is a chronic problem. The current episode started more than 1 year ago. The problem is controlled. Pertinent negatives include no chest pain, headaches, palpitations or shortness of breath. Risk factors for coronary artery disease include dyslipidemia,  diabetes mellitus, obesity, sedentary lifestyle, smoking/tobacco exposure, family history and post-menopausal state. Past treatments include ACE inhibitors.       Objective:       06/16/2023    8:16 AM 02/09/2023    2:31 PM 01/18/2023    8:52 AM  Vitals with BMI  Height 5' 0 5' 0 5' 0  Weight 172 lbs 13 oz 170 lbs 167 lbs 6 oz  BMI 33.75 33.2 32.69  Systolic 124 154 161  Diastolic 74 92 78  Pulse 92 94 92    BP 124/74   Pulse 92   Ht 5' (1.524 m)   Wt 172 lb 12.8 oz (78.4 kg)   BMI 33.75 kg/m   Wt Readings from Last 3 Encounters:  06/16/23 172 lb 12.8 oz (78.4 kg)  02/09/23 170 lb (77.1 kg)  01/18/23 167 lb 6.4 oz (75.9 kg)     Physical Exam    CMP ( most recent) CMP     Component Value Date/Time   NA 141 01/14/2023 0922   K 4.5 01/14/2023 0922   CL 100 01/14/2023 0922   CO2 25 01/14/2023 0922   GLUCOSE 64 (L) 01/14/2023 0922   GLUCOSE 145 (H) 04/30/2021 0855   BUN 16 04/08/2023 0000   CREATININE 0.9 04/08/2023 0000   CREATININE 1.02 (H) 01/14/2023 0922   CALCIUM 9.9 01/14/2023 0922   PROT 7.5 01/14/2023 0922   ALBUMIN 4.3 01/14/2023 0922   AST 18 01/14/2023 0922   ALT 15 01/14/2023 0922  ALKPHOS 77 01/14/2023 0922   BILITOT 0.7 01/14/2023 0922   GFRNONAA >60 04/30/2021 0855   GFRAA >60 07/31/2010 0430    Lab Results  Component Value Date   HGBA1C 10.1 (A) 06/16/2023   HGBA1C 9.1 (A) 01/18/2023   HGBA1C 8.8 (A) 09/08/2022    Lipid Panel     Component Value Date/Time   CHOL 150 04/08/2023 0000   CHOL 148 01/14/2023 0922   TRIG 159 04/08/2023 0000   HDL 56 04/08/2023 0000   HDL 52 01/14/2023 0922   CHOLHDL 2.8 01/14/2023 0922   CHOLHDL 2.0 02/07/2010 0432   VLDL 9 02/07/2010 0432   LDLCALC 67 04/08/2023 0000   LDLCALC 65 01/14/2023 0922   LABVLDL 31 01/14/2023 0922      Lab Results  Component Value Date   TSH 1.670 03/25/2022   TSH 0.727 10/11/2017   FREET4 1.12 03/25/2022      Assessment & Plan:   1. Type 2 diabetes  mellitus with other specified complication, with long-term current use of insulin  (HCC)  - Alenna Russell Comma-Watson has currently uncontrolled symptomatic type 2 DM since  63 years of age.  She presents with her CGM device.  More recently, her AGP report shows loss of control in her glycemic profile.  AGP report shows average blood glucose of 176, 51% time in range, 36% Libre 1 hyperglycemia, 11% able to hyperglycemia.  She does have 2% daily 1 hypoglycemia.  Her point-of-care A1c is 10.1% progressively worsening from 8.1%.     Recent labs reviewed. - I had a long discussion with her about the progressive nature of diabetes and the pathology behind its complications. -her diabetes is complicated by obesity/sedentary life, polypharmacy, obstructive sleep apnea, comorbid conditions of hyperlipidemia, hypertension and she remains at a high risk for more acute and chronic complications which include CAD, CVA, CKD, retinopathy, and neuropathy. These are all discussed in detail with her.  - I discussed all available options of managing her diabetes including de-escalation of medications. I have counseled her on diet  and weight management  by adopting a Whole Food , Plant Predominant  ( WFPP) nutrition as recommended by [employer] of Lifestyle Medicine. Patient is encouraged to switch to  unprocessed or minimally processed  complex starch, adequate protein intake (mainly plant source), minimal liquid fat , no Solid fat,  plenty of fruits, and vegetables. -  she is advised to stick to a routine mealtimes to eat 3 complete meals a day and snack only when necessary ( to snack only to correct hypoglycemia BG <70 day time or <100 at night).   She did not engage with lifestyle medicine optimally.  However, she would like to try it again. - she acknowledges that there is a room for improvement in her food and drink choices. - Suggestion is made for her to avoid simple carbohydrates  from her diet including  Cakes, Sweet Desserts, Ice Cream, Soda (diet and regular), Sweet Tea, Candies, Chips, Cookies, Store Bought Juices, Alcohol , Artificial Sweeteners,  Coffee Creamer, and Sugar-free Products, Lemonade. This will help patient to have more stable blood glucose profile and potentially avoid unintended weight gain.  The following Lifestyle Medicine recommendations according to American College of Lifestyle Medicine  [facility]) were discussed and and offered to patient and she  agrees to start the journey:  A. Whole Foods, Plant-Based Nutrition comprising of fruits and vegetables, plant-based proteins, whole-grain carbohydrates was discussed in detail with the patient.   A list  for source of those nutrients were also provided to the patient.  Patient will use only water or unsweetened tea for hydration. B.  The need to stay away from risky substances including alcohol, smoking; obtaining 7 to 9 hours of restorative sleep, at least 150 minutes of moderate intensity exercise weekly, the importance of healthy social connections,  and stress management techniques were discussed. C.  A full color page of  Calorie density of various food groups per pound showing examples of each food groups was provided to the patient.    - she will be scheduled with Penny Crumpton, RDN, CDE for individualized diabetes education.  - I have approached her with the following plan to manage  her diabetes and patient agrees:   -She is advised to continue Humalog  75/25 80 units with breakfast and 80 units with supper only when Premeal blood glucose readings are above 90 mg per DL, associated with continuous monitoring of blood glucose using her CGM.   - she is warned not to take insulin  without proper monitoring per orders. - Adjustment parameters are given to her for hypo and hyperglycemia in writing. - she is encouraged to call clinic for blood glucose levels less than 70 or above 200 mg /dl. - After she finishes her current  supplies of Synjardy , her dose will be increased to 25/1000 mg XR p.o. daily at breakfast.    - She would benefit from a higher dose of Mounjaro , discussed and increase her Mounjaro  to 15 mg subcutaneously weekly.  Side effects and precautions discussed with her.     - Specific targets for  A1c;  LDL, HDL,  and Triglycerides were discussed with the patient.  2) Blood Pressure /Hypertension:   Her blood pressure is controlled to target.  she is advised to continue her current medications including lisinopril  20 mg p.o. daily with breakfast .  She should be considered for lower dose of lisinopril , 10 mg by her next refill.  3) Lipids/Hyperlipidemia:   Review of her recent lipid panel showed controlled LDL at 67.      She is advised to continue pravastatin  40 mg p.o. daily at bedtime.  side effects and precautions discussed with her.  Multiple plant-based diet discussed and recommended above will help with dyslipidemia as well.   4)  Weight/Diet:  Body mass index is 33.75 kg/m.  -She is losing weight,   she is  a candidate for weight loss. I discussed with her the fact that loss of 5 - 10% of her  current body weight will have the most impact on her diabetes management.  The above detailed  ACLM recommendations for nutrition, exercise, sleep, social life, avoidance of risky substances, the need for restorative sleep   information will also detailed on discharge instructions.  5) Chronic Care/Health Maintenance:  -she  is on ACEI/ARB and Statin medications and  is encouraged to initiate and continue to follow up with Ophthalmology, Dentist,  Podiatrist at least yearly or according to recommendations, and advised to   stay away from smoking. I have recommended yearly flu vaccine and pneumonia vaccine at least every 5 years; moderate intensity exercise for up to 150 minutes weekly; and  sleep for 7- 9 hours a day. Her foot exam is normal today. She is advised to discontinue her vitamin B12  supplements.   - she is  advised to maintain close follow up with Sun, Vyvyan, MD for primary care needs, as well as her other providers for optimal and  coordinated care.  I spent  42  minutes in the care of the patient today including review of labs from CMP, Lipids, Thyroid  Function, Hematology (current and previous including abstractions from other facilities); face-to-face time discussing  her blood glucose readings/logs, discussing hypoglycemia and hyperglycemia episodes and symptoms, medications doses, her options of short and long term treatment based on the latest standards of care / guidelines;  discussion about incorporating lifestyle medicine;  and documenting the encounter. Risk reduction counseling performed per USPSTF guidelines to reduce  obesity and cardiovascular risk factors.     Please refer to Patient Instructions for Blood Glucose Monitoring and Insulin /Medications Dosing Guide  in media tab for additional information. Please  also refer to  Patient Self Inventory in the Media  tab for reviewed elements of pertinent patient history.  Marjean Imperato Comma-Watson participated in the discussions, expressed understanding, and voiced agreement with the above plans.  All questions were answered to her satisfaction. she is encouraged to contact clinic should she have any questions or concerns prior to her return visit.      Follow up plan: - Return in about 3 months (around 09/16/2023) for F/U with Pre-visit Labs, Meter/CGM/Logs, A1c here.  Kalvin Orf, MD [facility] [address] [place], [place] [address] Phone: [phone]  Fax: [phone]    06/16/2023, 3:30 PM  This note was partially dictated with voice recognition software. Similar sounding words can be transcribed inadequately or may not  be corrected upon review.

## 2023-06-16 NOTE — Patient Instructions (Signed)

## 2023-06-17 ENCOUNTER — Encounter: Payer: Self-pay | Admitting: Podiatry

## 2023-06-17 ENCOUNTER — Other Ambulatory Visit (HOSPITAL_COMMUNITY): Payer: Self-pay

## 2023-06-17 ENCOUNTER — Ambulatory Visit (INDEPENDENT_AMBULATORY_CARE_PROVIDER_SITE_OTHER)

## 2023-06-17 ENCOUNTER — Ambulatory Visit: Admitting: Podiatry

## 2023-06-17 DIAGNOSIS — S92514A Nondisplaced fracture of proximal phalanx of right lesser toe(s), initial encounter for closed fracture: Secondary | ICD-10-CM

## 2023-06-17 DIAGNOSIS — E119 Type 2 diabetes mellitus without complications: Secondary | ICD-10-CM

## 2023-06-17 DIAGNOSIS — S9031XA Contusion of right foot, initial encounter: Secondary | ICD-10-CM | POA: Diagnosis not present

## 2023-06-17 MED ORDER — ZOSTER VAC RECOMB ADJUVANTED 50 MCG/0.5ML IM SUSR
0.5000 mL | Freq: Once | INTRAMUSCULAR | 0 refills | Status: AC
  Filled 2023-06-17: qty 0.5, 1d supply, fill #0

## 2023-06-17 NOTE — Progress Notes (Signed)
 Subjective:   Patient ID: Mariah [name], female   DOB: 63 y.o.   MRN: [id number]   HPI Patient presents stating that 5 days ago she stubbed her right fifth toe and has been very swollen and concerning for her and it has been painful.  Also concerned about diabetes with her last A1c being 10.1   ROS      Objective:  Physical Exam  Neurovascular status was found to be intact with patient noted to have a swollen fifth digit right more than the proximal phalanx with moderate rotation noted of the toe.  Good digital perfusion well-oriented     Assessment:  Fracture of the fifth digit right foot probable with diabetes which appears to be under good control     Plan:  H&P reviewed and I went ahead today and I advised this patient on diabetes and that overall she is doing pretty good and that I do not recommend any changes except for getting her A1c under better control which she promises.  As far as the toe goes it is good to be sore 8 to 12 weeks and I reviewed this with her explaining fracture and recovery  X-rays indicate a fracture of the base of the fifth digit medial side slightly displaced but should eventually heal uneventfully and if it were to become chronically sore the small piece would need to be excised

## 2023-06-18 ENCOUNTER — Other Ambulatory Visit (HOSPITAL_COMMUNITY): Payer: Self-pay

## 2023-06-20 ENCOUNTER — Encounter: Payer: Self-pay | Admitting: Podiatry

## 2023-06-21 ENCOUNTER — Other Ambulatory Visit: Payer: Self-pay

## 2023-06-23 ENCOUNTER — Other Ambulatory Visit (HOSPITAL_COMMUNITY): Payer: Self-pay

## 2023-06-23 MED ORDER — PNEUMOCOCCAL 20-VAL CONJ VACC 0.5 ML IM SUSY
0.5000 mL | PREFILLED_SYRINGE | Freq: Once | INTRAMUSCULAR | 0 refills | Status: AC
  Filled 2023-06-23: qty 0.5, 1d supply, fill #0

## 2023-06-30 DIAGNOSIS — G5602 Carpal tunnel syndrome, left upper limb: Secondary | ICD-10-CM | POA: Diagnosis not present

## 2023-07-01 ENCOUNTER — Other Ambulatory Visit (HOSPITAL_COMMUNITY): Payer: Self-pay

## 2023-07-01 ENCOUNTER — Other Ambulatory Visit: Payer: Self-pay

## 2023-07-04 ENCOUNTER — Other Ambulatory Visit (HOSPITAL_COMMUNITY): Payer: Self-pay

## 2023-07-04 DIAGNOSIS — G4733 Obstructive sleep apnea (adult) (pediatric): Secondary | ICD-10-CM | POA: Diagnosis not present

## 2023-07-05 ENCOUNTER — Other Ambulatory Visit (HOSPITAL_COMMUNITY): Payer: Self-pay

## 2023-07-06 ENCOUNTER — Other Ambulatory Visit (HOSPITAL_COMMUNITY)
Admission: RE | Admit: 2023-07-06 | Discharge: 2023-07-06 | Disposition: A | Discharge status: Home / Self Care | Source: Ambulatory Visit | Attending: Radiology | Admitting: Radiology

## 2023-07-06 ENCOUNTER — Ambulatory Visit (INDEPENDENT_AMBULATORY_CARE_PROVIDER_SITE_OTHER): Admitting: Radiology

## 2023-07-06 ENCOUNTER — Encounter: Payer: Self-pay | Admitting: Radiology

## 2023-07-06 VITALS — BP 112/72 | HR 96 | Ht 60.5 in | Wt 169.8 lb

## 2023-07-06 DIAGNOSIS — H401131 Primary open-angle glaucoma, bilateral, mild stage: Secondary | ICD-10-CM | POA: Diagnosis not present

## 2023-07-06 DIAGNOSIS — Z01419 Encounter for gynecological examination (general) (routine) without abnormal findings: Secondary | ICD-10-CM

## 2023-07-06 DIAGNOSIS — I10 Essential (primary) hypertension: Secondary | ICD-10-CM | POA: Diagnosis not present

## 2023-07-06 DIAGNOSIS — Z1331 Encounter for screening for depression: Secondary | ICD-10-CM | POA: Diagnosis not present

## 2023-07-06 DIAGNOSIS — F900 Attention-deficit hyperactivity disorder, predominantly inattentive type: Secondary | ICD-10-CM | POA: Diagnosis not present

## 2023-07-06 DIAGNOSIS — E113293 Type 2 diabetes mellitus with mild nonproliferative diabetic retinopathy without macular edema, bilateral: Secondary | ICD-10-CM | POA: Diagnosis not present

## 2023-07-06 NOTE — Patient Instructions (Signed)
 Preventive Care 16-63 Years Old, Female  Preventive care refers to lifestyle choices and visits with your health care provider that can promote health and wellness. Preventive care visits are also called wellness exams.  What can I expect for my preventive care visit?  Counseling  Your health care provider may ask you questions about your:  Medical history, including:  Past medical problems.  Family medical history.  Pregnancy history.  Current health, including:  Menstrual cycle.  Method of birth control.  Emotional well-being.  Home life and relationship well-being.  Sexual activity and sexual health.  Lifestyle, including:  Alcohol, nicotine or tobacco, and drug use.  Access to firearms.  Diet, exercise, and sleep habits.  Work and work Astronomer.  Sunscreen use.  Safety issues such as seatbelt and bike helmet use.  Physical exam  Your health care provider will check your:  Height and weight. These may be used to calculate your BMI (body mass index). BMI is a measurement that tells if you are at a healthy weight.  Waist circumference. This measures the distance around your waistline. This measurement also tells if you are at a healthy weight and may help predict your risk of certain diseases, such as type 2 diabetes and high blood pressure.  Heart rate and blood pressure.  Body temperature.  Skin for abnormal spots.  What immunizations do I need?    Vaccines are usually given at various ages, according to a schedule. Your health care provider will recommend vaccines for you based on your age, medical history, and lifestyle or other factors, such as travel or where you work.  What tests do I need?  Screening  Your health care provider may recommend screening tests for certain conditions. This may include:  Lipid and cholesterol levels.  Diabetes screening. This is done by checking your blood sugar (glucose) after you have not eaten for a while (fasting).  Pelvic exam and Pap test.  Hepatitis B test.  Hepatitis C  test.  HIV (human immunodeficiency virus) test.  STI (sexually transmitted infection) testing, if you are at risk.  Lung cancer screening.  Colorectal cancer screening.  Mammogram. Talk with your health care provider about when you should start having regular mammograms. This may depend on whether you have a family history of breast cancer.  BRCA-related cancer screening. This may be done if you have a family history of breast, ovarian, tubal, or peritoneal cancers.  Bone density scan. This is done to screen for osteoporosis.  Talk with your health care provider about your test results, treatment options, and if necessary, the need for more tests.  Follow these instructions at home:  Eating and drinking    Eat a diet that includes fresh fruits and vegetables, whole grains, lean protein, and low-fat dairy products.  Take vitamin and mineral supplements as recommended by your health care provider.  Do not drink alcohol if:  Your health care provider tells you not to drink.  You are pregnant, may be pregnant, or are planning to become pregnant.  If you drink alcohol:  Limit how much you have to 0-1 drink a day.  Know how much alcohol is in your drink. In the [place] one drink equals one 12 oz bottle of beer (355 mL), one 5 oz glass of wine (148 mL), or one 1 oz glass of hard liquor (44 mL).  Lifestyle  Brush your teeth every morning and night with fluoride toothpaste. Floss one time each day.  Exercise for at least  30 minutes 5 or more days each week.  Do not use any products that contain nicotine or tobacco. These products include cigarettes, chewing tobacco, and vaping devices, such as e-cigarettes. If you need help quitting, ask your health care provider.  Do not use drugs.  If you are sexually active, practice safe sex. Use a condom or other form of protection to prevent STIs.  If you do not wish to become pregnant, use a form of birth control. If you plan to become pregnant, see your health care provider for a  prepregnancy visit.  Take aspirin only as told by your health care provider. Make sure that you understand how much to take and what form to take. Work with your health care provider to find out whether it is safe and beneficial for you to take aspirin daily.  Find healthy ways to manage stress, such as:  Meditation, yoga, or listening to music.  Journaling.  Talking to a trusted person.  Spending time with friends and family.  Minimize exposure to UV radiation to reduce your risk of skin cancer.  Safety  Always wear your seat belt while driving or riding in a vehicle.  Do not drive:  If you have been drinking alcohol. Do not ride with someone who has been drinking.  When you are tired or distracted.  While texting.  If you have been using any mind-altering substances or drugs.  Wear a helmet and other protective equipment during sports activities.  If you have firearms in your house, make sure you follow all gun safety procedures.  Seek help if you have been physically or sexually abused.  What's next?  Visit your health care provider once a year for an annual wellness visit.  Ask your health care provider how often you should have your eyes and teeth checked.  Stay up to date on all vaccines.  This information is not intended to replace advice given to you by your health care provider. Make sure you discuss any questions you have with your health care provider.  Document Revised: 06/18/2020 Document Reviewed: 06/18/2020  Elsevier Patient Education  2024 [employer].

## 2023-07-06 NOTE — Progress Notes (Signed)
   Mariah [name] [birth year] [id number]   History: Postmenopausal 64 y.o. presents for annual exam. No gyn concerns.   Gynecologic History Postmenopausal Last Pap: 2021. Results were: normal Last mammogram: 5/25. Results were: normal Last colonoscopy: 2014 DEXA:2023   Obstetric History OB History  Gravida Para Term Preterm AB Living  0     0  SAB IAB Ectopic Multiple Live Births             07/06/2023    8:19 AM 02/09/2023    3:30 PM 09/16/2021    8:15 AM  Depression screen PHQ 2/9  Decreased Interest 2 1 0  Down, Depressed, Hopeless 3 2 1   PHQ - 2 Score 5 3 1   Altered sleeping 2 2 1   Tired, decreased energy 3 3 1   Change in appetite 0 0 1  Feeling bad or failure about yourself  2 2 1   Trouble concentrating 3 3 1   Moving slowly or fidgety/restless 2 1 0  Suicidal thoughts 0 0 0  PHQ-9 Score 17 14 6   Difficult doing work/chores Somewhat difficult Very difficult Very difficult     The following portions of the patient's history were reviewed and updated as appropriate: allergies, current medications, past family history, past medical history, past social history, past surgical history, and problem list.  Review of Systems Pertinent items noted in HPI and remainder of comprehensive ROS otherwise negative.  Past medical history, past surgical history, family history and social history were all reviewed and documented in the EPIC chart.  Exam:  Vitals:   07/06/23 0817  BP: 112/72  Pulse: 96  SpO2: 99%  Weight: 169 lb 12.8 oz (77 kg)  Height: 5' 0.5 (1.537 m)   Body mass index is 32.62 kg/m.  General appearance:  Normal Thyroid :  Symmetrical, normal in size, without palpable masses or nodularity. Respiratory  Auscultation:  Clear without wheezing or rhonchi Cardiovascular  Auscultation:  Regular rate, without rubs, murmurs or gallops  Edema/varicosities:  Not grossly evident Abdominal  Soft,nontender, without masses, guarding or  rebound.  Liver/spleen:  No organomegaly noted  Hernia:  None appreciated  Skin  Inspection:  Grossly normal Breasts: Examined lying and sitting.   Right: Without masses, retractions, nipple discharge or axillary adenopathy.   Left: Without masses, retractions, nipple discharge or axillary adenopathy. Genitourinary   Inguinal/mons:  Normal without inguinal adenopathy  External genitalia:  Normal appearing vulva with no masses, tenderness, or lesions  BUS/Urethra/Skene's glands:  Normal  Vagina:  Normal appearing with normal color and discharge, no lesions. Atrophy: mild   Cervix:  Normal appearing without discharge or lesions  Uterus:  Normal in size, shape and contour.  Midline and mobile, nontender  Adnexa/parametria:     Rt: Normal in size, without masses or tenderness.   Lt: Normal in size, without masses or tenderness.  Anus and perineum: Normal    Mariah [name], Mariah [name] present for exam  Assessment/Plan:   1. Well woman exam with routine gynecological exam (Primary) - Cytology - PAP( [place])  2. Depression screen Stable. Managed by another provider   Return in 1 year for annual or sooner prn.  Mariah [name] B WHNP-BC, 9:37 AM 07/06/2023

## 2023-07-07 ENCOUNTER — Ambulatory Visit: Payer: Self-pay | Admitting: Radiology

## 2023-07-07 DIAGNOSIS — M79642 Pain in left hand: Secondary | ICD-10-CM | POA: Diagnosis not present

## 2023-07-07 DIAGNOSIS — G5602 Carpal tunnel syndrome, left upper limb: Secondary | ICD-10-CM | POA: Diagnosis not present

## 2023-07-07 LAB — CYTOLOGY - PAP
Adequacy: ABSENT
Diagnosis: NEGATIVE

## 2023-07-16 ENCOUNTER — Other Ambulatory Visit (HOSPITAL_COMMUNITY): Payer: Self-pay

## 2023-07-20 ENCOUNTER — Telehealth: Payer: Self-pay

## 2023-07-20 ENCOUNTER — Other Ambulatory Visit (HOSPITAL_COMMUNITY): Payer: Self-pay

## 2023-07-20 NOTE — Telephone Encounter (Signed)
 Left a message requesting pt return call to the office.

## 2023-07-22 ENCOUNTER — Telehealth: Payer: Self-pay

## 2023-07-22 NOTE — Telephone Encounter (Signed)
 Tried to return pt's call, left a message requesting a return call to the office.

## 2023-07-23 ENCOUNTER — Other Ambulatory Visit (HOSPITAL_COMMUNITY): Payer: Self-pay

## 2023-07-26 ENCOUNTER — Other Ambulatory Visit (HOSPITAL_COMMUNITY): Payer: Self-pay

## 2023-07-29 ENCOUNTER — Other Ambulatory Visit (HOSPITAL_COMMUNITY): Payer: Self-pay

## 2023-08-03 ENCOUNTER — Other Ambulatory Visit: Payer: Self-pay | Admitting: "Endocrinology

## 2023-08-03 ENCOUNTER — Other Ambulatory Visit (HOSPITAL_COMMUNITY): Payer: Self-pay

## 2023-08-03 ENCOUNTER — Other Ambulatory Visit: Payer: Self-pay

## 2023-08-03 ENCOUNTER — Other Ambulatory Visit (HOSPITAL_BASED_OUTPATIENT_CLINIC_OR_DEPARTMENT_OTHER): Payer: Self-pay

## 2023-08-03 MED ORDER — FREESTYLE LIBRE 3 SENSOR MISC
0 refills | Status: DC
  Filled 2023-08-03: qty 6, 90d supply, fill #0

## 2023-08-05 ENCOUNTER — Other Ambulatory Visit (HOSPITAL_COMMUNITY): Payer: Self-pay

## 2023-08-05 MED ORDER — CETIRIZINE HCL 10 MG PO TABS
10.0000 mg | ORAL_TABLET | Freq: Every evening | ORAL | 4 refills | Status: AC | PRN
  Filled 2023-08-05 – 2023-08-10 (×2): qty 30, 30d supply, fill #0
  Filled 2023-09-19: qty 30, 30d supply, fill #1
  Filled 2023-12-01: qty 30, 30d supply, fill #2

## 2023-08-10 ENCOUNTER — Other Ambulatory Visit (HOSPITAL_COMMUNITY): Payer: Self-pay

## 2023-08-11 ENCOUNTER — Other Ambulatory Visit (HOSPITAL_BASED_OUTPATIENT_CLINIC_OR_DEPARTMENT_OTHER): Payer: Self-pay

## 2023-08-11 ENCOUNTER — Other Ambulatory Visit (HOSPITAL_COMMUNITY): Payer: Self-pay

## 2023-08-11 ENCOUNTER — Encounter (HOSPITAL_COMMUNITY): Payer: Self-pay

## 2023-08-11 ENCOUNTER — Other Ambulatory Visit: Payer: Self-pay

## 2023-08-11 DIAGNOSIS — R112 Nausea with vomiting, unspecified: Secondary | ICD-10-CM | POA: Diagnosis not present

## 2023-08-11 DIAGNOSIS — Z03818 Encounter for observation for suspected exposure to other biological agents ruled out: Secondary | ICD-10-CM | POA: Diagnosis not present

## 2023-08-11 DIAGNOSIS — R519 Headache, unspecified: Secondary | ICD-10-CM | POA: Diagnosis not present

## 2023-08-11 DIAGNOSIS — R5383 Other fatigue: Secondary | ICD-10-CM | POA: Diagnosis not present

## 2023-08-11 DIAGNOSIS — R5381 Other malaise: Secondary | ICD-10-CM | POA: Diagnosis not present

## 2023-08-11 MED ORDER — ONDANSETRON 8 MG PO TBDP
8.0000 mg | ORAL_TABLET | Freq: Three times a day (TID) | ORAL | 0 refills | Status: AC | PRN
  Filled 2023-08-11 (×2): qty 9, 3d supply, fill #0

## 2023-08-12 ENCOUNTER — Other Ambulatory Visit (HOSPITAL_COMMUNITY): Payer: Self-pay

## 2023-08-22 ENCOUNTER — Encounter: Payer: Self-pay | Admitting: "Endocrinology

## 2023-08-22 ENCOUNTER — Ambulatory Visit (HOSPITAL_BASED_OUTPATIENT_CLINIC_OR_DEPARTMENT_OTHER): Admit: 2023-08-22 | Admitting: Orthopedic Surgery

## 2023-08-22 ENCOUNTER — Encounter (HOSPITAL_BASED_OUTPATIENT_CLINIC_OR_DEPARTMENT_OTHER): Payer: Self-pay

## 2023-08-22 SURGERY — CARPAL TUNNEL RELEASE
Anesthesia: Monitor Anesthesia Care | Laterality: Left

## 2023-08-23 ENCOUNTER — Other Ambulatory Visit: Payer: Self-pay

## 2023-08-23 ENCOUNTER — Other Ambulatory Visit (HOSPITAL_COMMUNITY): Payer: Self-pay

## 2023-08-23 DIAGNOSIS — G5602 Carpal tunnel syndrome, left upper limb: Secondary | ICD-10-CM | POA: Diagnosis not present

## 2023-08-23 DIAGNOSIS — E1169 Type 2 diabetes mellitus with other specified complication: Secondary | ICD-10-CM

## 2023-08-23 HISTORY — PX: CARPAL TUNNEL RELEASE: SHX101

## 2023-08-23 MED ORDER — HYDROCODONE-ACETAMINOPHEN 5-325 MG PO TABS
1.0000 | ORAL_TABLET | Freq: Three times a day (TID) | ORAL | 0 refills | Status: DC | PRN
  Filled 2023-08-23: qty 15, 5d supply, fill #0

## 2023-08-24 ENCOUNTER — Encounter: Payer: Self-pay | Admitting: Pharmacist

## 2023-08-24 ENCOUNTER — Other Ambulatory Visit (HOSPITAL_COMMUNITY): Payer: Self-pay

## 2023-08-24 ENCOUNTER — Other Ambulatory Visit: Payer: Self-pay

## 2023-08-24 MED ORDER — FREESTYLE LIBRE 3 PLUS SENSOR MISC
1.0000 | 0 refills | Status: DC
  Filled 2023-08-24 – 2023-08-30 (×2): qty 6, 90d supply, fill #0

## 2023-08-26 ENCOUNTER — Other Ambulatory Visit (HOSPITAL_COMMUNITY): Payer: Self-pay

## 2023-08-29 ENCOUNTER — Other Ambulatory Visit: Payer: Self-pay

## 2023-08-30 ENCOUNTER — Other Ambulatory Visit (HOSPITAL_COMMUNITY): Payer: Self-pay

## 2023-08-30 ENCOUNTER — Other Ambulatory Visit: Payer: Self-pay

## 2023-09-01 ENCOUNTER — Other Ambulatory Visit (HOSPITAL_COMMUNITY): Payer: Self-pay

## 2023-09-02 ENCOUNTER — Other Ambulatory Visit: Payer: Self-pay

## 2023-09-02 ENCOUNTER — Other Ambulatory Visit (HOSPITAL_COMMUNITY): Payer: Self-pay

## 2023-09-03 ENCOUNTER — Other Ambulatory Visit (HOSPITAL_COMMUNITY): Payer: Self-pay

## 2023-09-06 DIAGNOSIS — G5602 Carpal tunnel syndrome, left upper limb: Secondary | ICD-10-CM | POA: Diagnosis not present

## 2023-09-07 ENCOUNTER — Other Ambulatory Visit (HOSPITAL_COMMUNITY): Payer: Self-pay

## 2023-09-08 ENCOUNTER — Other Ambulatory Visit (HOSPITAL_COMMUNITY): Payer: Self-pay

## 2023-09-19 ENCOUNTER — Other Ambulatory Visit: Payer: Self-pay | Admitting: "Endocrinology

## 2023-09-19 ENCOUNTER — Other Ambulatory Visit (HOSPITAL_COMMUNITY): Payer: Self-pay

## 2023-09-19 DIAGNOSIS — Z794 Long term (current) use of insulin: Secondary | ICD-10-CM

## 2023-09-20 ENCOUNTER — Other Ambulatory Visit (HOSPITAL_COMMUNITY): Payer: Self-pay

## 2023-09-20 ENCOUNTER — Other Ambulatory Visit: Payer: Self-pay

## 2023-09-20 MED ORDER — HYDROCODONE-ACETAMINOPHEN 5-325 MG PO TABS
1.0000 | ORAL_TABLET | Freq: Three times a day (TID) | ORAL | 0 refills | Status: AC | PRN
  Filled 2023-09-20: qty 15, 5d supply, fill #0

## 2023-09-21 ENCOUNTER — Other Ambulatory Visit (HOSPITAL_COMMUNITY): Payer: Self-pay

## 2023-09-21 MED ORDER — GLUCAGON EMERGENCY 1 MG IJ KIT
PACK | INTRAMUSCULAR | 0 refills | Status: AC
  Filled 2023-09-21: qty 1, 30d supply, fill #0

## 2023-09-21 NOTE — Progress Notes (Deleted)
 Date:  09/21/2023   ID:  Almarie SAILOR [name], DOB [birth year], MRN [id number]   PCP:  Sun, Vyvyan, MD  Sleep Medicine:  Wilbert Bihari, MD Electrophysiologist:  None   Chief Complaint:  OSA  History of Present Illness:    Mariah [name] is a 63 y.o. female with a hx of OSA on PAP, HTN and obesity.  She is doing well with her PAP device and thinks that she has gotten used to it.  She tolerates the mask and feels the pressure is adequate.  Since going on PAP she feels rested in the am and has no significant daytime sleepiness.  She denies any significant mouth or nasal dryness or nasal congestion.  She does not think that he snores. An Epworth Sleepiness Scale score was calculated the office today and this endorsed at ### arguing against residual daytime sleepiness. Patient denies any episodes of bruxism, restless legs, No gagging hallucinations or cataplectic events.   Coronary Ca score in 2018 was 0.  Prior CV studies:   The following studies were reviewed today:  PAP compliance download  Past Medical History:  Diagnosis Date   Allergy    Anemia    Anxiety    Arthritis    Asthma    Body odor    from colostomy takedown   Carpal tunnel syndrome    left hand   Cervicalgia    Depression    Diabetes mellitus without complication (HCC)    Fibroid    GERD (gastroesophageal reflux disease)    Glaucoma    Headache    Heart murmur    as a child   Hyperlipidemia    Hypertension    Hypothyroidism    Infertility    OSA (obstructive sleep apnea)    upper airway resistance syndrome on CPAP at 9cm H2O   Reflux    Vertigo    Past Surgical History:  Procedure Laterality Date   COLECTOMY WITH COLOSTOMY CREATION/HARTMANN PROCEDURE  02/07/2010   Perforated   COLOSTOMY TAKEDOWN  07/28/2010   Lap colostomy takedown   LAPAROTOMY N/A 05/07/2021   Procedure: ABDOMINAL WALL EXPLORATION, PROBABLE REMOVAL OF FOREIGN BODY;  Surgeon: Sheldon Standing, MD;  Location: [facility];   Service: General;  Laterality: N/A;   TONSILLECTOMY     VENTRAL HERNIA REPAIR  02/07/2010   Primary repair of old hernia     No outpatient medications have been marked as taking for the 09/22/23 encounter (Appointment) with Bihari Wilbert SAUNDERS, MD.     Allergies:   Codeine, Fluticasone , and Other   Social History   Tobacco Use   Smoking status: Former    Current packs/day: 0.00    Types: Cigarettes    Quit date: 11/19/1989    Years since quitting: 33.8    Passive exposure: Past   Smokeless tobacco: Never  Vaping Use   Vaping status: Never Used  Substance Use Topics   Alcohol use: Yes    Comment: socially   Drug use: No     Family Hx: The patient's family history includes Breast cancer in her paternal grandmother; Cancer in her maternal grandfather and maternal grandmother; Cardiomyopathy in her mother; Diabetes in her father, maternal grandmother, and mother; Eczema in her brother; Heart attack in her father and paternal grandmother; Heart disease in her maternal grandmother; Hyperlipidemia in her mother; Hypertension in her father, maternal grandmother, and mother; Other in her mother; Rheum arthritis in her mother; Stroke in her maternal grandmother.  ROS:  Please see the history of present illness.     All other systems reviewed and are negative.   Labs/Other Tests and Data Reviewed:          Recent Labs: 01/14/2023: ALT 15; Potassium 4.5; Sodium 141 04/08/2023: BUN 16; Creatinine 0.9   Recent Lipid Panel Lab Results  Component Value Date/Time   CHOL 150 04/07/2023 12:00 AM   CHOL 148 01/14/2023 09:22 AM   TRIG 159 04/07/2023 12:00 AM   HDL 56 04/07/2023 12:00 AM   HDL 52 01/14/2023 09:22 AM   CHOLHDL 2.8 01/14/2023 09:22 AM   CHOLHDL 2.0 02/07/2010 04:32 AM   LDLCALC 67 04/07/2023 12:00 AM   LDLCALC 65 01/14/2023 09:22 AM    Wt Readings from Last 3 Encounters:  07/06/23 169 lb 12.8 oz (77 kg)  06/16/23 172 lb 12.8 oz (78.4 kg)  02/09/23 170 lb (77.1 kg)      Objective:    Vital Signs:  There were no vitals taken for this visit.  GEN: Well nourished, well developed in no acute distress HEENT: Normal NECK: No JVD; No carotid bruits LYMPHATICS: No lymphadenopathy CARDIAC:RRR, no murmurs, rubs, gallops RESPIRATORY:  Clear to auscultation without rales, wheezing or rhonchi  ABDOMEN: Soft, non-tender, non-distended MUSCULOSKELETAL:  No edema; No deformity  SKIN: Warm and dry NEUROLOGIC:  Alert and oriented x 3 PSYCHIATRIC:  Normal affect  ASSESSMENT & PLAN:    OSA - The patient is tolerating PAP therapy well without any problems. The PAP download performed by his DME was personally reviewed and interpreted by me today and showed an AHI of ***/hr on *** cm H2O with ***% compliance in using more than 4 hours nightly.  The patient has been using and benefiting from PAP use and will continue to benefit from therapy.    2.  HTN - BP is controlled on exam today - Continue lisinopril  20 mg daily with as needed refills -I have personally reviewed and interpreted outside labs performed by patient's PCP which showed ***    Medication Adjustments/Labs and Tests Ordered: Current medicines are reviewed at length with the patient today.  Concerns regarding medicines are outlined above.  Tests Ordered: No orders of the defined types were placed in this encounter.  Medication Changes: No orders of the defined types were placed in this encounter.   Disposition:  Follow up in 1 year(s)  Signed, Wilbert Bihari, MD  09/21/2023 4:57 PM    [facility]

## 2023-09-22 ENCOUNTER — Other Ambulatory Visit: Payer: Self-pay

## 2023-09-22 ENCOUNTER — Ambulatory Visit: Attending: Cardiology | Admitting: Cardiology

## 2023-09-22 ENCOUNTER — Other Ambulatory Visit (HOSPITAL_COMMUNITY): Payer: Self-pay

## 2023-09-22 DIAGNOSIS — G4733 Obstructive sleep apnea (adult) (pediatric): Secondary | ICD-10-CM

## 2023-09-22 DIAGNOSIS — I10 Essential (primary) hypertension: Secondary | ICD-10-CM

## 2023-09-23 ENCOUNTER — Encounter: Payer: Self-pay | Admitting: Cardiology

## 2023-09-23 ENCOUNTER — Other Ambulatory Visit (HOSPITAL_COMMUNITY): Payer: Self-pay

## 2023-09-23 MED ORDER — METHYLPHENIDATE HCL ER (OSM) 27 MG PO TBCR
27.0000 mg | EXTENDED_RELEASE_TABLET | Freq: Every day | ORAL | 0 refills | Status: DC
  Filled 2023-10-28 – 2023-11-01 (×2): qty 30, 30d supply, fill #0

## 2023-09-23 MED ORDER — METHYLPHENIDATE HCL ER (OSM) 27 MG PO TBCR
27.0000 mg | EXTENDED_RELEASE_TABLET | Freq: Every morning | ORAL | 0 refills | Status: AC
  Filled 2023-09-23: qty 30, 30d supply, fill #0

## 2023-09-28 ENCOUNTER — Other Ambulatory Visit (HOSPITAL_COMMUNITY): Payer: Self-pay

## 2023-09-28 DIAGNOSIS — H401131 Primary open-angle glaucoma, bilateral, mild stage: Secondary | ICD-10-CM | POA: Diagnosis not present

## 2023-09-28 DIAGNOSIS — H02889 Meibomian gland dysfunction of unspecified eye, unspecified eyelid: Secondary | ICD-10-CM | POA: Diagnosis not present

## 2023-09-28 DIAGNOSIS — E119 Type 2 diabetes mellitus without complications: Secondary | ICD-10-CM | POA: Diagnosis not present

## 2023-09-28 DIAGNOSIS — H35033 Hypertensive retinopathy, bilateral: Secondary | ICD-10-CM | POA: Diagnosis not present

## 2023-09-28 DIAGNOSIS — H1045 Other chronic allergic conjunctivitis: Secondary | ICD-10-CM | POA: Diagnosis not present

## 2023-09-28 MED ORDER — EYSUVIS 0.25 % OP SUSP
1.0000 [drp] | Freq: Four times a day (QID) | OPHTHALMIC | 1 refills | Status: AC
  Filled 2023-09-28: qty 8.3, 30d supply, fill #0
  Filled 2023-09-30: qty 8.3, 20d supply, fill #0

## 2023-09-29 ENCOUNTER — Encounter (HOSPITAL_COMMUNITY): Payer: Self-pay

## 2023-09-29 ENCOUNTER — Other Ambulatory Visit (HOSPITAL_COMMUNITY): Payer: Self-pay

## 2023-09-30 ENCOUNTER — Other Ambulatory Visit (HOSPITAL_COMMUNITY): Payer: Self-pay

## 2023-10-04 NOTE — Progress Notes (Unsigned)
 SLEEP MEDICINE VIRTUAL VISIT via Video Note   Because of Mariah [name]'s co-morbid illnesses, she is at least at moderate risk for complications without adequate follow up.  This format is felt to be most appropriate for this patient at this time.  All issues noted in this document were discussed and addressed.  A limited physical exam was performed with this format.  Please refer to the patient's chart for her consent to telehealth for [facility].  Date:  10/04/2023   ID:  Mariah [name], DOB [birth year], MRN [id number] The patient was identified using 2 identifiers.  Patient Location: Home Provider Location: Home Office   PCP:  Sun, Vyvyan, MD   [facility] Providers Cardiologist:  None     Evaluation Performed:  Follow-Up Visit  Chief Complaint:  OSA  History of Present Illness:    Mariah [name] is a 63 y.o. female with a hx of OSA on PAP, HTN and obesity.  She is doing well with her PAP device and thinks that she has gotten used to it.  She tolerates the mask and feels the pressure is adequate.  Since going on PAP she feels rested in the am and has no significant daytime sleepiness.  She denies any significant mouth or nasal dryness or nasal congestion.  She does not think that he snores.  Patient denies any episodes of bruxism, restless legs, No gagging hallucinations or cataplectic events.    Prior CV studies:   The following studies were reviewed today:  PAP compliance download  Past Medical History:  Diagnosis Date   Allergy    Anemia    Anxiety    Arthritis    Asthma    Body odor    from colostomy takedown   Carpal tunnel syndrome    left hand   Cervicalgia    Depression    Diabetes mellitus without complication (HCC)    Fibroid    GERD (gastroesophageal reflux disease)    Glaucoma    Headache    Heart murmur    as a child   Hyperlipidemia    Hypertension    Hypothyroidism    Infertility    OSA  (obstructive sleep apnea)    upper airway resistance syndrome on CPAP at 9cm H2O   Reflux    Vertigo    Past Surgical History:  Procedure Laterality Date   COLECTOMY WITH COLOSTOMY CREATION/HARTMANN PROCEDURE  02/07/2010   Perforated   COLOSTOMY TAKEDOWN  07/28/2010   Lap colostomy takedown   LAPAROTOMY N/A 05/07/2021   Procedure: ABDOMINAL WALL EXPLORATION, PROBABLE REMOVAL OF FOREIGN BODY;  Surgeon: Sheldon Standing, MD;  Location: [facility];  Service: General;  Laterality: N/A;   TONSILLECTOMY     VENTRAL HERNIA REPAIR  02/07/2010   Primary repair of old hernia     No outpatient medications have been marked as taking for the 10/05/23 encounter (Appointment) with Shlomo Wilbert SAUNDERS, MD.     Allergies:   Codeine, Fluticasone , and Other   Social History   Tobacco Use   Smoking status: Former    Current packs/day: 0.00    Types: Cigarettes    Quit date: 11/19/1989    Years since quitting: 33.8    Passive exposure: Past   Smokeless tobacco: Never  Vaping Use   Vaping status: Never Used  Substance Use Topics   Alcohol use: Yes    Comment: socially   Drug use: No  Family Hx: The patient's family history includes Breast cancer in her paternal grandmother; Cancer in her maternal grandfather and maternal grandmother; Cardiomyopathy in her mother; Diabetes in her father, maternal grandmother, and mother; Eczema in her brother; Heart attack in her father and paternal grandmother; Heart disease in her maternal grandmother; Hyperlipidemia in her mother; Hypertension in her father, maternal grandmother, and mother; Other in her mother; Rheum arthritis in her mother; Stroke in her maternal grandmother.  ROS:   Please see the history of present illness.     All other systems reviewed and are negative.   Labs/Other Tests and Data Reviewed:          Recent Labs: 01/14/2023: ALT 15; Potassium 4.5; Sodium 141 04/08/2023: BUN 16; Creatinine 0.9   Recent Lipid Panel Lab Results   Component Value Date/Time   CHOL 150 04/07/2023 12:00 AM   CHOL 148 01/14/2023 09:22 AM   TRIG 159 04/07/2023 12:00 AM   HDL 56 04/07/2023 12:00 AM   HDL 52 01/14/2023 09:22 AM   CHOLHDL 2.8 01/14/2023 09:22 AM   CHOLHDL 2.0 02/07/2010 04:32 AM   LDLCALC 67 04/07/2023 12:00 AM   LDLCALC 65 01/14/2023 09:22 AM    Wt Readings from Last 3 Encounters:  07/06/23 169 lb 12.8 oz (77 kg)  06/16/23 172 lb 12.8 oz (78.4 kg)  02/09/23 170 lb (77.1 kg)     Objective:    Vital Signs:  There were no vitals taken for this visit.  Well nourished, well developed female in no acute distress. Well appearing, alert and conversant, regular work of breathing,  good skin color  Eyes- anicteric mouth- oral mucosa is pink  neuro- grossly intact skin- no apparent rash or lesions or cyanosis ASSESSMENT & PLAN:    OSA - The patient is tolerating PAP therapy well without any problems. The PAP download performed by his DME was personally reviewed and interpreted by me today and showed an AHI of 1.9 /hr on 9 cm H2O with 12% compliance in using more than 4 hours nightly.  The patient has been using and benefiting from PAP use and will continue to benefit from therapy.  - Encouraged to be more compliant with her device   HTN -BP controlled - Continue lisinopril  20 mg daily with as needed refills   Total time of encounter: 20 minutes total time of encounter, including 15 minutes spent in face-to-face patient care on the date of this encounter. This time includes coordination of care and counseling regarding above mentioned problem list. Remainder of non-face-to-face time involved reviewing chart documents/testing relevant to the patient encounter and documentation in the medical record. I have independently reviewed documentation from referring provider.    Medication Adjustments/Labs and Tests Ordered: Current medicines are reviewed at length with the patient today.  Concerns regarding medicines are  outlined above.   Tests Ordered: No orders of the defined types were placed in this encounter.   Medication Changes: No orders of the defined types were placed in this encounter.   Follow Up:  {F/U Format:[phone]} {follow up:15908}  Signed, Wilbert Bihari, MD  10/04/2023 2:30 PM    [facility]  Medication Adjustments/Labs and Tests Ordered: Current medicines are reviewed at length with the patient today.  Concerns regarding medicines are outlined above.  Tests Ordered: No orders of the defined types were placed in this encounter.  Medication Changes: No orders of the defined types were placed in this encounter.   Disposition:  Follow up in  1 year(s)  Signed, Wilbert Bihari, MD  10/04/2023 2:28 PM    [facility]

## 2023-10-05 ENCOUNTER — Telehealth: Payer: Self-pay | Admitting: *Deleted

## 2023-10-05 ENCOUNTER — Ambulatory Visit: Attending: Internal Medicine | Admitting: Cardiology

## 2023-10-05 DIAGNOSIS — G4733 Obstructive sleep apnea (adult) (pediatric): Secondary | ICD-10-CM | POA: Diagnosis not present

## 2023-10-05 DIAGNOSIS — I10 Essential (primary) hypertension: Secondary | ICD-10-CM

## 2023-10-05 NOTE — Telephone Encounter (Signed)
  Patient Consent for Virtual Visit        Mariah [name] has provided verbal consent on 10/05/2023 for a virtual visit (video or telephone).   CONSENT FOR VIRTUAL VISIT FOR:  Mariah [name]  By participating in this virtual visit I agree to the following:  I hereby voluntarily request, consent and authorize [facility] and its employed or contracted physicians, physician assistants, nurse practitioners or other licensed health care professionals (the Practitioner), to provide me with telemedicine health care services (the "Services) as deemed necessary by the treating Practitioner. I acknowledge and consent to receive the Services by the Practitioner via telemedicine. I understand that the telemedicine visit will involve communicating with the Practitioner through live audiovisual communication technology and the disclosure of certain medical information by electronic transmission. I acknowledge that I have been given the opportunity to request an in-person assessment or other available alternative prior to the telemedicine visit and am voluntarily participating in the telemedicine visit.  I understand that I have the right to withhold or withdraw my consent to the use of telemedicine in the course of my care at any time, without affecting my right to future care or treatment, and that the Practitioner or I may terminate the telemedicine visit at any time. I understand that I have the right to inspect all information obtained and/or recorded in the course of the telemedicine visit and may receive copies of available information for a reasonable fee.  I understand that some of the potential risks of receiving the Services via telemedicine include:  Delay or interruption in medical evaluation due to technological equipment failure or disruption; Information transmitted may not be sufficient (e.g. poor resolution of images) to allow for appropriate medical decision making  by the Practitioner; and/or  In rare instances, security protocols could fail, causing a breach of personal health information.  Furthermore, I acknowledge that it is my responsibility to provide information about my medical history, conditions and care that is complete and accurate to the best of my ability. I acknowledge that Practitioner's advice, recommendations, and/or decision may be based on factors not within their control, such as incomplete or inaccurate data provided by me or distortions of diagnostic images or specimens that may result from electronic transmissions. I understand that the practice of medicine is not an exact science and that Practitioner makes no warranties or guarantees regarding treatment outcomes. I acknowledge that a copy of this consent can be made available to me via my patient portal [facility] MyChart), or I can request a printed copy by calling the office of [facility].    I understand that my insurance will be billed for this visit.   I have read or had this consent read to me. I understand the contents of this consent, which adequately explains the benefits and risks of the Services being provided via telemedicine.  I have been provided ample opportunity to ask questions regarding this consent and the Services and have had my questions answered to my satisfaction. I give my informed consent for the services to be provided through the use of telemedicine in my medical care

## 2023-10-05 NOTE — Patient Instructions (Addendum)
 Medication Instructions:  Your physician recommends that you continue on your current medications as directed. Please refer to the Current Medication list given to you today.    Follow-Up:  Dr. Robie sleep team and coordinator will be following up with you soon to coordinate everything discussed in the visit today.

## 2023-10-07 ENCOUNTER — Telehealth: Payer: Self-pay | Admitting: *Deleted

## 2023-10-07 DIAGNOSIS — I10 Essential (primary) hypertension: Secondary | ICD-10-CM

## 2023-10-07 DIAGNOSIS — G4733 Obstructive sleep apnea (adult) (pediatric): Secondary | ICD-10-CM

## 2023-10-07 NOTE — Telephone Encounter (Signed)
-----   Message from Wilbert Bihari sent at 10/05/2023  9:27 AM EDT ----- order her a new ResMed CPAP at 9cm H2O with heated humidity and mask of choice.

## 2023-10-07 NOTE — Telephone Encounter (Signed)
 Upon patient request DME selection is [facility]  Order placed to [facility] to order her a new ResMed CPAP at 9cm H2O with heated humidity and mask of choice.

## 2023-10-15 ENCOUNTER — Other Ambulatory Visit (HOSPITAL_COMMUNITY): Payer: Self-pay

## 2023-10-17 ENCOUNTER — Other Ambulatory Visit (HOSPITAL_COMMUNITY): Payer: Self-pay

## 2023-10-17 DIAGNOSIS — G4733 Obstructive sleep apnea (adult) (pediatric): Secondary | ICD-10-CM | POA: Diagnosis not present

## 2023-10-18 ENCOUNTER — Other Ambulatory Visit (HOSPITAL_COMMUNITY): Payer: Self-pay

## 2023-10-18 DIAGNOSIS — E1169 Type 2 diabetes mellitus with other specified complication: Secondary | ICD-10-CM | POA: Diagnosis not present

## 2023-10-18 DIAGNOSIS — Z794 Long term (current) use of insulin: Secondary | ICD-10-CM | POA: Diagnosis not present

## 2023-10-18 MED ORDER — PRAVASTATIN SODIUM 40 MG PO TABS
40.0000 mg | ORAL_TABLET | Freq: Every day | ORAL | 0 refills | Status: DC
  Filled 2023-10-18: qty 90, 90d supply, fill #0

## 2023-10-19 LAB — COMPREHENSIVE METABOLIC PANEL WITH GFR
ALT: 13 IU/L (ref 0–32)
AST: 21 IU/L (ref 0–40)
Albumin: 4.1 g/dL (ref 3.9–4.9)
Alkaline Phosphatase: 63 IU/L (ref 49–135)
BUN/Creatinine Ratio: 14 (ref 12–28)
BUN: 13 mg/dL (ref 8–27)
Bilirubin Total: 0.3 mg/dL (ref 0.0–1.2)
CO2: 23 mmol/L (ref 20–29)
Calcium: 9.7 mg/dL (ref 8.7–10.3)
Chloride: 103 mmol/L (ref 96–106)
Creatinine, Ser: 0.92 mg/dL (ref 0.57–1.00)
Globulin, Total: 2.8 g/dL (ref 1.5–4.5)
Glucose: 70 mg/dL (ref 70–99)
Potassium: 4.6 mmol/L (ref 3.5–5.2)
Sodium: 140 mmol/L (ref 134–144)
Total Protein: 6.9 g/dL (ref 6.0–8.5)
eGFR: 70 mL/min/1.73 (ref >59)

## 2023-10-19 LAB — LIPID PANEL
Chol/HDL Ratio: 3.4 ratio (ref 0.0–4.4)
Cholesterol, Total: 175 mg/dL (ref 100–199)
HDL: 52 mg/dL (ref >39)
LDL Chol Calc (NIH): 84 mg/dL (ref 0–99)
Triglycerides: 236 mg/dL — ABNORMAL HIGH (ref 0–149)
VLDL Cholesterol Cal: 39 mg/dL (ref 5–40)

## 2023-10-20 ENCOUNTER — Ambulatory Visit: Admitting: "Endocrinology

## 2023-10-20 ENCOUNTER — Other Ambulatory Visit: Payer: Self-pay

## 2023-10-20 ENCOUNTER — Encounter: Payer: Self-pay | Admitting: "Endocrinology

## 2023-10-20 VITALS — BP 106/74 | HR 88 | Ht 60.0 in | Wt 165.2 lb

## 2023-10-20 DIAGNOSIS — I10 Essential (primary) hypertension: Secondary | ICD-10-CM | POA: Diagnosis not present

## 2023-10-20 DIAGNOSIS — Z794 Long term (current) use of insulin: Secondary | ICD-10-CM

## 2023-10-20 DIAGNOSIS — Z6832 Body mass index (BMI) 32.0-32.9, adult: Secondary | ICD-10-CM | POA: Diagnosis not present

## 2023-10-20 DIAGNOSIS — E1169 Type 2 diabetes mellitus with other specified complication: Secondary | ICD-10-CM | POA: Diagnosis not present

## 2023-10-20 DIAGNOSIS — E782 Mixed hyperlipidemia: Secondary | ICD-10-CM | POA: Diagnosis not present

## 2023-10-20 DIAGNOSIS — E6609 Other obesity due to excess calories: Secondary | ICD-10-CM

## 2023-10-20 DIAGNOSIS — E66811 Obesity, class 1: Secondary | ICD-10-CM | POA: Diagnosis not present

## 2023-10-20 LAB — POCT GLYCOSYLATED HEMOGLOBIN (HGB A1C): HbA1c, POC (controlled diabetic range): 9.2 % — AB (ref 0.0–7.0)

## 2023-10-20 MED ORDER — TIRZEPATIDE 12.5 MG/0.5ML ~~LOC~~ SOAJ
12.5000 mg | SUBCUTANEOUS | 1 refills | Status: AC
  Filled 2023-10-20: qty 2, 28d supply, fill #0
  Filled 2023-12-01: qty 2, 28d supply, fill #1

## 2023-10-20 MED ORDER — HUMALOG MIX 75/25 KWIKPEN (75-25) 100 UNIT/ML ~~LOC~~ SUPN: 70.0000 [IU] | PEN_INJECTOR | Freq: Two times a day (BID) | SUBCUTANEOUS | 2 refills | Status: DC

## 2023-10-20 NOTE — Progress Notes (Signed)
 10/20/2023, 10:52 AM  Endocrinology follow-up note   Subjective:    Patient ID: Mariah [name], female    DOB: [birth year].  Shontae Rosiles Comma-Watson is being seen in follow-up after she was seen in consultation for management of currently uncontrolled symptomatic diabetes requested by  Sun, Vyvyan, MD.   Past Medical History:  Diagnosis Date   Allergy    Anemia    Anxiety    Arthritis    Asthma    Body odor    from colostomy takedown   Carpal tunnel syndrome    left hand   Cervicalgia    Depression    Diabetes mellitus without complication (HCC)    Fibroid    GERD (gastroesophageal reflux disease)    Glaucoma    Headache    Heart murmur    as a child   Hyperlipidemia    Hypertension    Hypothyroidism    Infertility    OSA (obstructive sleep apnea)    upper airway resistance syndrome on CPAP at 9cm H2O   Reflux    Vertigo     Past Surgical History:  Procedure Laterality Date   CARPAL TUNNEL RELEASE Left 08/23/2023   COLECTOMY WITH COLOSTOMY CREATION/HARTMANN PROCEDURE  02/07/2010   Perforated   COLOSTOMY TAKEDOWN  07/28/2010   Lap colostomy takedown   LAPAROTOMY N/A 05/07/2021   Procedure: ABDOMINAL WALL EXPLORATION, PROBABLE REMOVAL OF FOREIGN BODY;  Surgeon: Sheldon Standing, MD;  Location: [facility];  Service: General;  Laterality: N/A;   TONSILLECTOMY     VENTRAL HERNIA REPAIR  02/07/2010   Primary repair of old hernia    Social History   Socioeconomic History   Marital status: Married    Spouse name: Not on file   Number of children: Not on file   Years of education: Not on file   Highest education level: Not on file  Occupational History   Not on file  Tobacco Use   Smoking status: Former    Current packs/day: 0.00    Types: Cigarettes    Quit date: 11/19/1989    Years since quitting: 33.9    Passive exposure: Past   Smokeless tobacco: Never  Vaping Use   Vaping  status: Never Used  Substance and Sexual Activity   Alcohol use: Yes    Comment: socially   Drug use: No   Sexual activity: Not Currently    Partners: Male    Birth control/protection: Post-menopausal    Comment: menarche 63yo, 1ST intercourse-17, partners- 3, married- 24 yrs  Other Topics Concern   Not on file  Social History Narrative   Not on file   Social Drivers of Health   Financial Resource Strain: Not on file  Food Insecurity: No Food Insecurity (02/05/2022)   Hunger Vital Sign    Worried About Running Out of Food in the Last Year: Never true    Ran Out of Food in the Last Year: Never true  Transportation Needs: No Transportation Needs (02/05/2022)   PRAPARE - Administrator, Civil Service (Medical): No    Lack of Transportation (Non-Medical): No  Physical Activity: Not on file  Stress: Not on file  Social Connections:  Not on file    Family History  Problem Relation Age of Onset   Rheum arthritis Mother    Diabetes Mother    Hypertension Mother    Hyperlipidemia Mother    Other Mother    Cardiomyopathy Mother    Diabetes Father    Hypertension Father    Heart attack Father    Eczema Brother    Cancer Maternal Grandmother    Diabetes Maternal Grandmother    Hypertension Maternal Grandmother    Heart disease Maternal Grandmother    Stroke Maternal Grandmother    Cancer Maternal Grandfather        COLON   Heart attack Paternal Grandmother    Breast cancer Paternal Grandmother        Age 8's    Outpatient Encounter Medications as of 10/20/2023  Medication Sig   tirzepatide  (MOUNJARO ) 12.5 MG/0.5ML Pen Inject 12.5 mg into the skin once a week.   [DISCONTINUED] tirzepatide  (MOUNJARO ) 15 MG/0.5ML Pen Inject 15 mg into the skin once a week. (Patient taking differently: Inject 12.5 mg into the skin once a week.)   ADVAIR  DISKUS 500-50 MCG/ACT AEPB Inhale 1 puff into the lungs in the morning and at bedtime.   albuterol  (VENTOLIN  HFA) 108 (90 Base)  MCG/ACT inhaler Inhale 1 - 2 puffs into the lungs every 6 hours as needed for wheezing or shortness of breath.   aspirin 81 MG EC tablet Take 81 mg by mouth daily.   aspirin-acetaminophen -caffeine (EXCEDRIN MIGRAINE) 250-250-65 MG tablet Take 1 tablet by mouth every 6 (six) hours as needed for headache.   azelastine  (ASTELIN ) 0.1 % nasal spray Place 1 spray into each nostril 1 - 2 times daily.   Biotin 2500 MCG CAPS Take 2,500 mcg by mouth at bedtime.   brimonidine  (ALPHAGAN  P) 0.1 % SOLN Place 1 drop into both eyes 2 (two) times daily.   cetirizine  (ZYRTEC ) 10 MG tablet Take 1 tablet (10 mg total) by mouth at bedtime. (Patient not taking: Reported on 07/06/2023)   cetirizine  (ZYRTEC ) 10 MG tablet Take 1 tablet (10 mg total) by mouth at bedtime as needed for allergies or itching.   cholecalciferol (VITAMIN D3) 25 MCG (1000 UNIT) tablet Take 1,000 Units by mouth at bedtime.   Continuous Glucose Sensor (FREESTYLE LIBRE 3 PLUS SENSOR) MISC Use to check blood sugar continuously. Change sensor every 15 days.   DULoxetine  (CYMBALTA ) 60 MG capsule Take 1 capsule (60 mg) by mouth daily.   DULoxetine  (CYMBALTA ) 60 MG capsule Take 1 capsule (60 mg total) by mouth daily.   DULoxetine  (CYMBALTA ) 60 MG capsule Take 1 capsule (60 mg total) by mouth daily.   Empagliflozin -metFORMIN  HCl ER (SYNJARDY  XR) 25-1000 MG TB24 Take 1 tablet by mouth daily.   EPINEPHrine  0.3 mg/0.3 mL IJ SOAJ injection Inject 0.3 mg into the muscle as needed for anaphylaxis.   EPINEPHrine  0.3 mg/0.3 mL IJ SOAJ injection Inject into anterior thigh as needed to treat severe allergic reaction. (Patient not taking: Reported on 07/06/2023)   escitalopram (LEXAPRO) 20 MG tablet Take 1 tablet every day by oral route. (Patient not taking: Reported on 07/06/2023)   estradiol  (ESTRACE  VAGINAL) 0.1 MG/GM vaginal cream Place 1 gram vaginally 2 times a week at bedtime   fluconazole  (DIFLUCAN ) 150 MG tablet Take 1 tablet (150 mg) by mouth every 3 days.    Glucagon , rDNA, (GLUCAGON  EMERGENCY) 1 MG KIT Use as directed.   glycopyrrolate  (ROBINUL ) 1 MG tablet Take 1 tablet (1 mg total) by mouth  2 (two) times daily.   glycopyrrolate  (ROBINUL ) 1 MG tablet Take 1 tablet (1 mg total) by mouth 2 (two) times daily.   HUMALOG  MIX 75/25 KWIKPEN (75-25) 100 UNIT/ML KwikPen Inject 70 Units into the skin 2 (two) times daily before a meal.   HYDROcodone -acetaminophen  (NORCO/VICODIN) 5-325 MG tablet Take 1 tablet by mouth every 8 (eight) hours as needed for pain for 5 days.   Insulin  Pen Needle 31G X 5 MM MISC Use to inject insulin    Insulin  Pen Needle 32G X 4 MM MISC Use to inject insulin  twice daily   lisinopril  (ZESTRIL ) 20 MG tablet Take 1 tablet (20 mg total) by mouth every morning.   lisinopril  (ZESTRIL ) 20 MG tablet Take 1 tablet (20 mg total) by mouth in the morning.   Loteprednol  Etabonate (EYSUVIS ) 0.25 % SUSP Place 1 drop into both eyes 3 (three) times daily. (Patient not taking: Reported on 07/06/2023)   Loteprednol  Etabonate (EYSUVIS ) 0.25 % SUSP Place 1 drop into both eyes 4 (four) times daily as directed.   meclizine (ANTIVERT) 25 MG tablet 1 TABLET FOUR TIMES A DAY AS NEEDED ORALLY 5 DAYS (Patient not taking: Reported on 07/06/2023)   methylphenidate  (CONCERTA ) 27 MG PO CR tablet Take 1 tablet by mouth once daily in the morning (Patient not taking: Reported on 07/06/2023)   methylphenidate  (CONCERTA ) 27 MG PO CR tablet Take 1 tablet (27 mg total) by mouth in the morning.   methylphenidate  (CONCERTA ) 27 MG PO CR tablet Take 1 tablet (27 mg total) by mouth daily.   methylphenidate  (CONCERTA ) 36 MG PO CR tablet Take 1 tablet by mouth once daily in the morning --may fill 30 days after last prescription   methylphenidate  (CONCERTA ) 36 MG PO CR tablet Take 1 tablet by mouth in the morning --may fill 30 days after last prescription   methylphenidate  (CONCERTA ) 36 MG PO CR tablet Take 1 tablet (36 mg total) by mouth in the morning.   methylphenidate  (CONCERTA ) 36  MG PO CR tablet Take 1 tablet (36 mg total) by mouth in the morning.   methylphenidate  (CONCERTA ) 36 MG PO CR tablet Take 1 tablet (36 mg total) by mouth in the morning.   methylphenidate  (CONCERTA ) 36 MG PO CR tablet Take 1 tablet (36 mg total) by mouth in the morning.   methylphenidate  (CONCERTA ) 36 MG PO CR tablet Take 1 tablet (36 mg total) by mouth in the morning.   montelukast  (SINGULAIR ) 10 MG tablet Take 1 tablet (10 mg total) by mouth daily.   Multiple Vitamins-Minerals (OCUVITE PO) Take 1 tablet by mouth daily.   Niacin (VITAMIN B-3 PO) Take 1 tablet by mouth daily.   ondansetron  (ZOFRAN -ODT) 8 MG disintegrating tablet Place 1 tablet(s) EVERY 8 HOURS by translingual route for 2 days prn n/v   ondansetron  (ZOFRAN -ODT) 8 MG disintegrating tablet Place 1 tablet (8 mg) on the tongue and allow to dissolve  three times daily as needed   pantoprazole  (PROTONIX ) 40 MG tablet Take 1 tablet (40 mg total) by mouth in the morning.   pravastatin  (PRAVACHOL ) 40 MG tablet Take 1 tablet (40 mg total) by mouth at bedtime.   pravastatin  (PRAVACHOL ) 40 MG tablet Take 1 tablet (40 mg total) by mouth at bedtime.   Probiotic Product (PROBIOTIC DAILY PO) Take 1 capsule by mouth at bedtime.   triamcinolone  cream (KENALOG ) 0.1 % Apply externally twice a day as needed (Patient not taking: Reported on 07/06/2023)   vitamin C (ASCORBIC ACID) 250 MG tablet Take  250 mg by mouth at bedtime.   zolpidem  (AMBIEN ) 10 MG tablet Take 1/2-1 tablet by mouth at bedtime, as needed (30 days)   [DISCONTINUED] HUMALOG  MIX 75/25 KWIKPEN (75-25) 100 UNIT/ML KwikPen Inject 80 Units into the skin 2 (two) times daily before a meal.   No facility-administered encounter medications on file as of 10/20/2023.    ALLERGIES: Allergies  Allergen Reactions   Codeine Hives, Swelling and Other (See Comments)   Fluticasone  Other (See Comments)    Nose bleeds    Other Other (See Comments)    mushrooms    VACCINATION STATUS: Immunization  History  Administered Date(s) Administered   Influenza Inj Mdck Quad Pf 09/29/2018   Influenza,inj,quad, With Preservative 10/05/2018, 11/21/2019   Influenza-Unspecified 09/29/2018, 10/15/2020   Moderna Sars-Covid-2 Vaccination 01/03/2019, 02/03/2019   PNEUMOCOCCAL CONJUGATE-20 06/23/2023   Pfizer Covid-19 Vaccine Bivalent Booster 11yrs & up 11/21/2020   Pfizer(Comirnaty )Fall Seasonal Vaccine 12 years and older 03/04/2022, 11/03/2022   Pneumococcal Polysaccharide-23 07/11/2006   Zoster Recombinant(Shingrix ) 06/17/2023    Diabetes She presents for her follow-up diabetic visit. She has type 2 diabetes mellitus. Onset time: She was diagnosed at approximate age of 50 years. Her disease course has been improving. There are no hypoglycemic associated symptoms. Pertinent negatives for hypoglycemia include no confusion, headaches, pallor or seizures. Pertinent negatives for diabetes include no chest pain, no fatigue, no polydipsia, no polyphagia and no polyuria. There are no hypoglycemic complications. Symptoms are improving. (On repeat condition Soliqua obesity, hyperlipidemia, hypertension, MASLD, obstructive sleep apnea.) Risk factors for coronary artery disease include dyslipidemia, diabetes mellitus, hypertension, obesity, family history, tobacco exposure, sedentary lifestyle and post-menopausal. Current diabetic treatments: She is currently on Humalog  75/25, Mounjaro , and Synjardy . Her weight is decreasing steadily. She is following a generally unhealthy diet. When asked about meal planning, she reported none. She participates in exercise intermittently. Her home blood glucose trend is decreasing steadily. Her breakfast blood glucose range is generally 130-140 mg/dl. Her lunch blood glucose range is generally 140-180 mg/dl. Her dinner blood glucose range is generally 140-180 mg/dl. Her bedtime blood glucose range is generally 140-180 mg/dl. Her overall blood glucose range is 140-180 mg/dl. (She presents  with her CGM device.  More recently, she has tried self improvement with average blood glucose of 137 mg per DL and the most recent 14 days with 33.5% glucose variability.  Her CGM AGP shows 76% time in range, 17% level 1 hyperglycemia.  She does have 6% level 1 hypoglycemia mostly nocturnal.  Her point-of-care A1c is 9.2% progressively improving from 10.1% during her last visit.  This is in contrast to her GMI at 6.6% the most recent weeks.    ) An ACE inhibitor/angiotensin II receptor blocker is being taken.  Hyperlipidemia This is a chronic problem. The current episode started more than 1 year ago. The problem is uncontrolled. Pertinent negatives include no chest pain, myalgias or shortness of breath. Current antihyperlipidemic treatment includes statins. Risk factors for coronary artery disease include dyslipidemia, diabetes mellitus, family history, obesity, hypertension, a sedentary lifestyle and post-menopausal.  Hypertension This is a chronic problem. The current episode started more than 1 year ago. The problem is controlled. Pertinent negatives include no chest pain, headaches, palpitations or shortness of breath. Risk factors for coronary artery disease include dyslipidemia, diabetes mellitus, obesity, sedentary lifestyle, smoking/tobacco exposure, family history and post-menopausal state. Past treatments include ACE inhibitors.     Objective:       10/20/2023    8:47 AM 07/06/2023  8:17 AM 06/16/2023    8:16 AM  Vitals with BMI  Height 5' 0 5' 0.5 5' 0  Weight 165 lbs 3 oz 169 lbs 13 oz 172 lbs 13 oz  BMI 32.26 32.6 33.75  Systolic 106 112 875  Diastolic 74 72 74  Pulse 88 96 92    BP 106/74   Pulse 88   Ht 5' (1.524 m)   Wt 165 lb 3.2 oz (74.9 kg)   BMI 32.26 kg/m   Wt Readings from Last 3 Encounters:  10/20/23 165 lb 3.2 oz (74.9 kg)  07/06/23 169 lb 12.8 oz (77 kg)  06/16/23 172 lb 12.8 oz (78.4 kg)     Physical Exam    CMP ( most recent) CMP      Component Value Date/Time   NA 140 10/18/2023 0955   K 4.6 10/18/2023 0955   CL 103 10/18/2023 0955   CO2 23 10/18/2023 0955   GLUCOSE 70 10/18/2023 0955   GLUCOSE 145 (H) 04/30/2021 0855   BUN 13 10/18/2023 0955   CREATININE 0.92 10/18/2023 0955   CALCIUM 9.7 10/18/2023 0955   PROT 6.9 10/18/2023 0955   ALBUMIN 4.1 10/18/2023 0955   AST 21 10/18/2023 0955   ALT 13 10/18/2023 0955   ALKPHOS 63 10/18/2023 0955   BILITOT 0.3 10/18/2023 0955   GFRNONAA >60 04/30/2021 0855   GFRAA >60 07/31/2010 0430    Lab Results  Component Value Date   HGBA1C 9.2 (A) 10/20/2023   HGBA1C 10.1 (A) 06/16/2023   HGBA1C 9.1 (A) 01/18/2023    Lipid Panel     Component Value Date/Time   CHOL 175 10/18/2023 0955   TRIG 236 (H) 10/18/2023 0955   HDL 52 10/18/2023 0955   CHOLHDL 3.4 10/18/2023 0955   CHOLHDL 2.0 02/07/2010 0432   VLDL 9 02/07/2010 0432   LDLCALC 84 10/18/2023 0955   LABVLDL 39 10/18/2023 0955      Lab Results  Component Value Date   TSH 1.670 03/25/2022   TSH 0.727 10/11/2017   FREET4 1.12 03/25/2022      Assessment & Plan:   1. Type 2 diabetes mellitus with other specified complication, with long-term current use of insulin  (HCC)  - Kenyata Napier Comma-Watson has currently uncontrolled symptomatic type 2 DM since  63 years of age.  She presents with her CGM device.  More recently, she has tried self improvement with average blood glucose of 137 mg per DL and the most recent 14 days with 33.5% glucose variability.  Her CGM AGP shows 76% time in range, 17% level 1 hyperglycemia.  She does have 6% level 1 hypoglycemia mostly nocturnal.  Her point-of-care A1c is 9.2% progressively improving from 10.1% during her last visit.  This is in contrast to her GMI at 6.6% the most recent weeks.   Recent labs reviewed. - I had a long discussion with her about the progressive nature of diabetes and the pathology behind its complications. -her diabetes is complicated by  obesity/sedentary life, polypharmacy, obstructive sleep apnea, comorbid conditions of hyperlipidemia, hypertension and she remains at a high risk for more acute and chronic complications which include CAD, CVA, CKD, retinopathy, and neuropathy. These are all discussed in detail with her.  - I discussed all available options of managing her diabetes including de-escalation of medications. I have counseled her on diet  and weight management  by adopting a Whole Food , Plant Predominant  ( WFPP) nutrition as recommended by [employer] of Lifestyle Medicine.  Patient is encouraged to switch to  unprocessed or minimally processed  complex starch, adequate protein intake (mainly plant source), minimal liquid fat , no Solid fat,  plenty of fruits, and vegetables. -  she is advised to stick to a routine mealtimes to eat 3 complete meals a day and snack only when necessary ( to snack only to correct hypoglycemia BG <70 day time or <100 at night).   She did not engage with lifestyle medicine optimally.  However, she would like to try it again. - she acknowledges that there is a room for improvement in her food and drink choices. - Suggestion is made for her to avoid simple carbohydrates  from her diet including Cakes, Sweet Desserts, Ice Cream, Soda (diet and regular), Sweet Tea, Candies, Chips, Cookies, Store Bought Juices, Alcohol , Artificial Sweeteners,  Coffee Creamer, and Sugar-free Products, Lemonade. This will help patient to have more stable blood glucose profile and potentially avoid unintended weight gain.  The following Lifestyle Medicine recommendations according to American College of Lifestyle Medicine  [facility]) were discussed and and offered to patient and she  agrees to start the journey:  A. Whole Foods, Plant-Based Nutrition comprising of fruits and vegetables, plant-based proteins, whole-grain carbohydrates was discussed in detail with the patient.   A list for source of those nutrients were  also provided to the patient.  Patient will use only water or unsweetened tea for hydration. B.  The need to stay away from risky substances including alcohol, smoking; obtaining 7 to 9 hours of restorative sleep, at least 150 minutes of moderate intensity exercise weekly, the importance of healthy social connections,  and stress management techniques were discussed. C.  A full color page of  Calorie density of various food groups per pound showing examples of each food groups was provided to the patient.   - I have approached her with the following plan to manage  her diabetes and patient agrees:   -She is advised to lower her Humalog  75/25 to 70 units with breakfast and 70 units with supper  only when Premeal blood glucose readings are above 90 mg per DL, associated with continuous monitoring of blood glucose using her CGM.   - she is warned not to take insulin  without proper monitoring per orders. - Adjustment parameters are given to her for hypo and hyperglycemia in writing. - she is encouraged to call clinic for blood glucose levels less than 70 or above 200 mg /dl. - She is benefiting from Synjardy , advised to continue Synjardy  25/1000 mg XR p.o. daily at breakfast.  -She did not tolerate a higher dose of Mounjaro , advised to continue Mounjaro  12.5 mg subcutaneously weekly.  Side effects and precautions discussed with her.   - Specific targets for  A1c;  LDL, HDL,  and Triglycerides were discussed with the patient.  2) Blood Pressure /Hypertension:   Her blood pressure is controlled to target.  she is advised to continue her current medications including lisinopril  20 mg p.o. daily with breakfast .  She should be considered for lower dose of lisinopril , 10 mg by her next refill.  3) Lipids/Hyperlipidemia:   Review of her recent lipid panel showed higher LDL at 84 increasing from 67.  She is advised to continue pravastatin  40 mg p.o. daily at bedtime.  She will be considered for additional  intervention with Zetia next visit.  4)  Weight/Diet:  Body mass index is 32.26 kg/m.  -She is losing weight,   she is  a candidate for weight loss. I discussed with her the fact that loss of 5 - 10% of her  current body weight will have the most impact on her diabetes management.  The above detailed  ACLM recommendations for nutrition, exercise, sleep, social life, avoidance of risky substances, the need for restorative sleep   information will also detailed on discharge instructions.  5) Chronic Care/Health Maintenance:  -she  is on ACEI/ARB and Statin medications and  is encouraged to initiate and continue to follow up with Ophthalmology, Dentist,  Podiatrist at least yearly or according to recommendations, and advised to   stay away from smoking. I have recommended yearly flu vaccine and pneumonia vaccine at least every 5 years; moderate intensity exercise for up to 150 minutes weekly; and  sleep for 7- 9 hours a day. Her foot exam is normal on October 20, 2023.   - she is  advised to maintain close follow up with Sun, Vyvyan, MD for primary care needs, as well as her other providers for optimal and coordinated care.   I spent  41  minutes in the care of the patient today including review of labs from CMP, Lipids, Thyroid  Function, Hematology (current and previous including abstractions from other facilities); face-to-face time discussing  her blood glucose readings/logs, discussing hypoglycemia and hyperglycemia episodes and symptoms, medications doses, her options of short and long term treatment based on the latest standards of care / guidelines;  discussion about incorporating lifestyle medicine;  and documenting the encounter. Risk reduction counseling performed per USPSTF guidelines to reduce  obesity and cardiovascular risk factors.     Please refer to Patient Instructions for Blood Glucose Monitoring and Insulin /Medications Dosing Guide  in media tab for additional information. Please   also refer to  Patient Self Inventory in the Media  tab for reviewed elements of pertinent patient history.  Jerae Izard Comma-Watson participated in the discussions, expressed understanding, and voiced agreement with the above plans.  All questions were answered to her satisfaction. she is encouraged to contact clinic should she have any questions or concerns prior to her return visit.    Follow up plan: - Return in about 4 months (around 02/20/2024) for Bring Meter/CGM Device/Logs- A1c in Office.  Ranny Earl, MD [facility] [address] [place], [place] [address] Phone: [phone]  Fax: [phone]    10/20/2023, 10:52 AM  This note was partially dictated with voice recognition software. Similar sounding words can be transcribed inadequately or may not  be corrected upon review.

## 2023-10-20 NOTE — Patient Instructions (Signed)

## 2023-10-22 ENCOUNTER — Other Ambulatory Visit (HOSPITAL_BASED_OUTPATIENT_CLINIC_OR_DEPARTMENT_OTHER): Payer: Self-pay

## 2023-10-22 MED ORDER — FLUZONE 0.5 ML IM SUSY
0.5000 mL | PREFILLED_SYRINGE | Freq: Once | INTRAMUSCULAR | 0 refills | Status: AC
  Filled 2023-10-22: qty 0.5, 1d supply, fill #0

## 2023-10-24 ENCOUNTER — Other Ambulatory Visit (HOSPITAL_COMMUNITY): Payer: Self-pay

## 2023-10-27 ENCOUNTER — Other Ambulatory Visit (HOSPITAL_COMMUNITY): Payer: Self-pay

## 2023-10-27 ENCOUNTER — Other Ambulatory Visit: Payer: Self-pay | Admitting: "Endocrinology

## 2023-10-27 DIAGNOSIS — E1169 Type 2 diabetes mellitus with other specified complication: Secondary | ICD-10-CM

## 2023-10-28 ENCOUNTER — Other Ambulatory Visit (HOSPITAL_COMMUNITY): Payer: Self-pay

## 2023-10-29 ENCOUNTER — Other Ambulatory Visit (HOSPITAL_COMMUNITY): Payer: Self-pay

## 2023-11-01 ENCOUNTER — Other Ambulatory Visit (HOSPITAL_COMMUNITY): Payer: Self-pay

## 2023-11-01 ENCOUNTER — Other Ambulatory Visit: Payer: Self-pay

## 2023-11-01 DIAGNOSIS — E1169 Type 2 diabetes mellitus with other specified complication: Secondary | ICD-10-CM

## 2023-11-01 MED ORDER — HUMALOG MIX 75/25 KWIKPEN (75-25) 100 UNIT/ML ~~LOC~~ SUPN
70.0000 [IU] | PEN_INJECTOR | Freq: Two times a day (BID) | SUBCUTANEOUS | 2 refills | Status: AC
  Filled 2023-11-01: qty 60, 60d supply, fill #0
  Filled 2023-11-02: qty 60, 43d supply, fill #0
  Filled 2023-12-23 – 2024-01-25 (×2): qty 60, 43d supply, fill #1
  Filled 2024-01-27: qty 30, 21d supply, fill #1

## 2023-11-02 ENCOUNTER — Other Ambulatory Visit (HOSPITAL_COMMUNITY): Payer: Self-pay

## 2023-11-02 ENCOUNTER — Encounter: Payer: Self-pay | Admitting: Pharmacist

## 2023-11-02 ENCOUNTER — Other Ambulatory Visit: Payer: Self-pay

## 2023-11-02 ENCOUNTER — Other Ambulatory Visit (HOSPITAL_BASED_OUTPATIENT_CLINIC_OR_DEPARTMENT_OTHER): Payer: Self-pay

## 2023-11-03 ENCOUNTER — Other Ambulatory Visit (HOSPITAL_BASED_OUTPATIENT_CLINIC_OR_DEPARTMENT_OTHER): Payer: Self-pay

## 2023-11-03 ENCOUNTER — Other Ambulatory Visit (HOSPITAL_COMMUNITY): Payer: Self-pay

## 2023-11-09 ENCOUNTER — Other Ambulatory Visit: Payer: Self-pay

## 2023-11-09 ENCOUNTER — Other Ambulatory Visit (HOSPITAL_COMMUNITY): Payer: Self-pay

## 2023-11-09 DIAGNOSIS — F900 Attention-deficit hyperactivity disorder, predominantly inattentive type: Secondary | ICD-10-CM | POA: Diagnosis not present

## 2023-11-09 DIAGNOSIS — G4700 Insomnia, unspecified: Secondary | ICD-10-CM | POA: Diagnosis not present

## 2023-11-09 DIAGNOSIS — R232 Flushing: Secondary | ICD-10-CM | POA: Diagnosis not present

## 2023-11-09 DIAGNOSIS — I10 Essential (primary) hypertension: Secondary | ICD-10-CM | POA: Diagnosis not present

## 2023-11-09 DIAGNOSIS — G4733 Obstructive sleep apnea (adult) (pediatric): Secondary | ICD-10-CM | POA: Diagnosis not present

## 2023-11-09 DIAGNOSIS — F419 Anxiety disorder, unspecified: Secondary | ICD-10-CM | POA: Diagnosis not present

## 2023-11-09 DIAGNOSIS — E113293 Type 2 diabetes mellitus with mild nonproliferative diabetic retinopathy without macular edema, bilateral: Secondary | ICD-10-CM | POA: Diagnosis not present

## 2023-11-09 DIAGNOSIS — E782 Mixed hyperlipidemia: Secondary | ICD-10-CM | POA: Diagnosis not present

## 2023-11-09 MED ORDER — METHYLPHENIDATE HCL ER (OSM) 36 MG PO TBCR
36.0000 mg | EXTENDED_RELEASE_TABLET | Freq: Every morning | ORAL | 0 refills | Status: AC
  Filled 2023-11-09: qty 30, 30d supply, fill #0

## 2023-11-09 MED ORDER — METHYLPHENIDATE HCL ER (OSM) 36 MG PO TBCR
36.0000 mg | EXTENDED_RELEASE_TABLET | Freq: Every morning | ORAL | 0 refills | Status: AC
  Filled 2023-11-09 – 2023-12-09 (×2): qty 30, 30d supply, fill #0

## 2023-11-09 MED ORDER — METHYLPHENIDATE HCL ER (OSM) 36 MG PO TBCR
36.0000 mg | EXTENDED_RELEASE_TABLET | Freq: Every morning | ORAL | 0 refills | Status: AC
  Filled 2023-12-01 – 2024-02-08 (×3): qty 30, 30d supply, fill #0

## 2023-11-12 ENCOUNTER — Other Ambulatory Visit (HOSPITAL_COMMUNITY): Payer: Self-pay

## 2023-11-15 ENCOUNTER — Other Ambulatory Visit (HOSPITAL_COMMUNITY): Payer: Self-pay

## 2023-11-22 ENCOUNTER — Other Ambulatory Visit (HOSPITAL_COMMUNITY): Payer: Self-pay

## 2023-11-22 ENCOUNTER — Other Ambulatory Visit: Payer: Self-pay

## 2023-11-23 ENCOUNTER — Other Ambulatory Visit (HOSPITAL_COMMUNITY): Payer: Self-pay

## 2023-12-01 ENCOUNTER — Other Ambulatory Visit: Payer: Self-pay | Admitting: "Endocrinology

## 2023-12-01 ENCOUNTER — Other Ambulatory Visit (HOSPITAL_COMMUNITY): Payer: Self-pay

## 2023-12-01 DIAGNOSIS — E1169 Type 2 diabetes mellitus with other specified complication: Secondary | ICD-10-CM

## 2023-12-02 ENCOUNTER — Other Ambulatory Visit (HOSPITAL_COMMUNITY): Payer: Self-pay

## 2023-12-02 ENCOUNTER — Other Ambulatory Visit: Payer: Self-pay

## 2023-12-02 ENCOUNTER — Encounter (HOSPITAL_COMMUNITY): Payer: Self-pay

## 2023-12-03 ENCOUNTER — Other Ambulatory Visit (HOSPITAL_COMMUNITY): Payer: Self-pay

## 2023-12-06 ENCOUNTER — Other Ambulatory Visit (HOSPITAL_COMMUNITY): Payer: Self-pay

## 2023-12-06 ENCOUNTER — Other Ambulatory Visit: Payer: Self-pay

## 2023-12-06 MED ORDER — FREESTYLE LIBRE 3 PLUS SENSOR MISC
1.0000 | 0 refills | Status: AC
  Filled 2023-12-06: qty 6, 90d supply, fill #0

## 2023-12-07 ENCOUNTER — Other Ambulatory Visit: Payer: Self-pay

## 2023-12-07 ENCOUNTER — Encounter: Payer: Self-pay | Admitting: Pharmacist

## 2023-12-07 ENCOUNTER — Other Ambulatory Visit (HOSPITAL_COMMUNITY): Payer: Self-pay

## 2023-12-09 ENCOUNTER — Other Ambulatory Visit (HOSPITAL_COMMUNITY): Payer: Self-pay

## 2023-12-09 ENCOUNTER — Other Ambulatory Visit: Payer: Self-pay

## 2023-12-13 ENCOUNTER — Other Ambulatory Visit (HOSPITAL_COMMUNITY): Payer: Self-pay

## 2023-12-13 MED ORDER — PANTOPRAZOLE SODIUM 40 MG PO TBEC
40.0000 mg | DELAYED_RELEASE_TABLET | Freq: Every day | ORAL | 0 refills | Status: AC
  Filled 2023-12-13: qty 30, 30d supply, fill #0

## 2023-12-22 ENCOUNTER — Other Ambulatory Visit: Payer: Self-pay

## 2023-12-22 ENCOUNTER — Other Ambulatory Visit (HOSPITAL_COMMUNITY): Payer: Self-pay

## 2023-12-23 ENCOUNTER — Other Ambulatory Visit: Payer: Self-pay

## 2023-12-23 ENCOUNTER — Other Ambulatory Visit (HOSPITAL_COMMUNITY): Payer: Self-pay

## 2023-12-26 ENCOUNTER — Other Ambulatory Visit (HOSPITAL_COMMUNITY): Payer: Self-pay

## 2023-12-26 ENCOUNTER — Other Ambulatory Visit (HOSPITAL_BASED_OUTPATIENT_CLINIC_OR_DEPARTMENT_OTHER): Payer: Self-pay

## 2023-12-26 ENCOUNTER — Encounter (HOSPITAL_COMMUNITY): Payer: Self-pay

## 2023-12-27 ENCOUNTER — Other Ambulatory Visit: Payer: Self-pay

## 2023-12-28 ENCOUNTER — Other Ambulatory Visit (HOSPITAL_COMMUNITY): Payer: Self-pay

## 2023-12-28 ENCOUNTER — Other Ambulatory Visit: Payer: Self-pay

## 2024-01-04 ENCOUNTER — Other Ambulatory Visit (HOSPITAL_COMMUNITY): Payer: Self-pay

## 2024-01-11 ENCOUNTER — Other Ambulatory Visit (HOSPITAL_COMMUNITY): Payer: Self-pay

## 2024-01-25 ENCOUNTER — Other Ambulatory Visit (HOSPITAL_COMMUNITY): Payer: Self-pay

## 2024-01-25 ENCOUNTER — Other Ambulatory Visit: Payer: Self-pay

## 2024-01-25 ENCOUNTER — Encounter: Payer: Self-pay | Admitting: Pharmacist

## 2024-01-25 ENCOUNTER — Encounter (HOSPITAL_COMMUNITY): Payer: Self-pay

## 2024-01-26 ENCOUNTER — Other Ambulatory Visit: Payer: Self-pay

## 2024-01-26 ENCOUNTER — Other Ambulatory Visit (HOSPITAL_BASED_OUTPATIENT_CLINIC_OR_DEPARTMENT_OTHER): Payer: Self-pay

## 2024-01-27 ENCOUNTER — Other Ambulatory Visit: Payer: Self-pay | Admitting: "Endocrinology

## 2024-01-27 ENCOUNTER — Other Ambulatory Visit (HOSPITAL_COMMUNITY): Payer: Self-pay

## 2024-01-27 DIAGNOSIS — E1169 Type 2 diabetes mellitus with other specified complication: Secondary | ICD-10-CM

## 2024-01-27 MED ORDER — DULOXETINE HCL 60 MG PO CPEP
60.0000 mg | ORAL_CAPSULE | Freq: Every day | ORAL | 1 refills | Status: AC
  Filled 2024-01-27: qty 90, 90d supply, fill #0

## 2024-01-27 MED ORDER — PRAVASTATIN SODIUM 40 MG PO TABS
40.0000 mg | ORAL_TABLET | Freq: Every evening | ORAL | 1 refills | Status: AC
  Filled 2024-01-27: qty 90, 90d supply, fill #0

## 2024-01-27 MED ORDER — TECHLITE PLUS PEN NEEDLES 32G X 4 MM MISC
2 refills | Status: AC
  Filled 2024-01-27: qty 200, 100d supply, fill #0

## 2024-01-28 ENCOUNTER — Other Ambulatory Visit (HOSPITAL_COMMUNITY): Payer: Self-pay

## 2024-01-30 ENCOUNTER — Other Ambulatory Visit (HOSPITAL_COMMUNITY): Payer: Self-pay

## 2024-02-08 ENCOUNTER — Other Ambulatory Visit (HOSPITAL_COMMUNITY): Payer: Self-pay

## 2024-02-23 ENCOUNTER — Ambulatory Visit: Admitting: "Endocrinology

## 2024-07-10 ENCOUNTER — Ambulatory Visit: Admitting: Radiology
